# Patient Record
Sex: Male | Born: 1965 | Race: White | Hispanic: No | Marital: Single | Smoking: Never smoker
Health system: Southern US, Community
[De-identification: ages and names within clinical notes are randomized; demographics above are authoritative.]

---

## 2020-05-06 ENCOUNTER — Encounter (HOSPITAL_COMMUNITY): Payer: Self-pay | Admitting: Emergency Medicine

## 2020-05-06 ENCOUNTER — Inpatient Hospital Stay (HOSPITAL_COMMUNITY)
Admission: EM | Admit: 2020-05-06 | Discharge: 2020-05-15 | DRG: 064 | Disposition: A | Discharge status: Rehabilitation Facility | Payer: BLUE CROSS/BLUE SHIELD | Attending: Internal Medicine | Admitting: Internal Medicine

## 2020-05-06 ENCOUNTER — Emergency Department (HOSPITAL_COMMUNITY): Payer: BLUE CROSS/BLUE SHIELD

## 2020-05-06 DIAGNOSIS — I160 Hypertensive urgency: Secondary | ICD-10-CM | POA: Diagnosis present

## 2020-05-06 DIAGNOSIS — I69392 Facial weakness following cerebral infarction: Secondary | ICD-10-CM | POA: Diagnosis not present

## 2020-05-06 DIAGNOSIS — R112 Nausea with vomiting, unspecified: Secondary | ICD-10-CM | POA: Diagnosis not present

## 2020-05-06 DIAGNOSIS — R4701 Aphasia: Secondary | ICD-10-CM | POA: Diagnosis present

## 2020-05-06 DIAGNOSIS — I6340 Cerebral infarction due to embolism of unspecified cerebral artery: Secondary | ICD-10-CM | POA: Diagnosis present

## 2020-05-06 DIAGNOSIS — I714 Abdominal aortic aneurysm, without rupture: Secondary | ICD-10-CM | POA: Diagnosis not present

## 2020-05-06 DIAGNOSIS — E669 Obesity, unspecified: Secondary | ICD-10-CM | POA: Diagnosis present

## 2020-05-06 DIAGNOSIS — R29706 NIHSS score 6: Secondary | ICD-10-CM | POA: Diagnosis present

## 2020-05-06 DIAGNOSIS — Z794 Long term (current) use of insulin: Secondary | ICD-10-CM | POA: Diagnosis not present

## 2020-05-06 DIAGNOSIS — H1089 Other conjunctivitis: Secondary | ICD-10-CM | POA: Diagnosis not present

## 2020-05-06 DIAGNOSIS — I63412 Cerebral infarction due to embolism of left middle cerebral artery: Principal | ICD-10-CM | POA: Diagnosis present

## 2020-05-06 DIAGNOSIS — G936 Cerebral edema: Secondary | ICD-10-CM | POA: Diagnosis present

## 2020-05-06 DIAGNOSIS — I69390 Apraxia following cerebral infarction: Secondary | ICD-10-CM | POA: Diagnosis not present

## 2020-05-06 DIAGNOSIS — G8191 Hemiplegia, unspecified affecting right dominant side: Secondary | ICD-10-CM | POA: Diagnosis present

## 2020-05-06 DIAGNOSIS — R4182 Altered mental status, unspecified: Secondary | ICD-10-CM | POA: Diagnosis not present

## 2020-05-06 DIAGNOSIS — I10 Essential (primary) hypertension: Secondary | ICD-10-CM | POA: Diagnosis present

## 2020-05-06 DIAGNOSIS — E871 Hypo-osmolality and hyponatremia: Secondary | ICD-10-CM | POA: Diagnosis present

## 2020-05-06 DIAGNOSIS — E1165 Type 2 diabetes mellitus with hyperglycemia: Secondary | ICD-10-CM | POA: Diagnosis not present

## 2020-05-06 DIAGNOSIS — E785 Hyperlipidemia, unspecified: Secondary | ICD-10-CM | POA: Diagnosis present

## 2020-05-06 DIAGNOSIS — Z6832 Body mass index (BMI) 32.0-32.9, adult: Secondary | ICD-10-CM | POA: Diagnosis not present

## 2020-05-06 DIAGNOSIS — I6522 Occlusion and stenosis of left carotid artery: Secondary | ICD-10-CM | POA: Diagnosis not present

## 2020-05-06 DIAGNOSIS — Z20822 Contact with and (suspected) exposure to covid-19: Secondary | ICD-10-CM | POA: Diagnosis present

## 2020-05-06 DIAGNOSIS — E119 Type 2 diabetes mellitus without complications: Secondary | ICD-10-CM | POA: Diagnosis present

## 2020-05-06 DIAGNOSIS — I639 Cerebral infarction, unspecified: Secondary | ICD-10-CM | POA: Diagnosis present

## 2020-05-06 DIAGNOSIS — H1031 Unspecified acute conjunctivitis, right eye: Secondary | ICD-10-CM | POA: Diagnosis not present

## 2020-05-06 DIAGNOSIS — I63032 Cerebral infarction due to thrombosis of left carotid artery: Secondary | ICD-10-CM | POA: Diagnosis present

## 2020-05-06 DIAGNOSIS — I712 Thoracic aortic aneurysm, without rupture: Secondary | ICD-10-CM | POA: Diagnosis present

## 2020-05-06 DIAGNOSIS — I719 Aortic aneurysm of unspecified site, without rupture: Secondary | ICD-10-CM | POA: Diagnosis present

## 2020-05-06 DIAGNOSIS — R414 Neurologic neglect syndrome: Secondary | ICD-10-CM | POA: Diagnosis present

## 2020-05-06 DIAGNOSIS — R29810 Facial weakness: Secondary | ICD-10-CM | POA: Diagnosis present

## 2020-05-06 DIAGNOSIS — B370 Candidal stomatitis: Secondary | ICD-10-CM | POA: Diagnosis not present

## 2020-05-06 DIAGNOSIS — R1312 Dysphagia, oropharyngeal phase: Secondary | ICD-10-CM | POA: Diagnosis present

## 2020-05-06 DIAGNOSIS — E876 Hypokalemia: Secondary | ICD-10-CM | POA: Diagnosis not present

## 2020-05-06 DIAGNOSIS — E1159 Type 2 diabetes mellitus with other circulatory complications: Secondary | ICD-10-CM | POA: Diagnosis not present

## 2020-05-06 DIAGNOSIS — D72829 Elevated white blood cell count, unspecified: Secondary | ICD-10-CM | POA: Diagnosis not present

## 2020-05-06 DIAGNOSIS — I63232 Cerebral infarction due to unspecified occlusion or stenosis of left carotid arteries: Secondary | ICD-10-CM | POA: Diagnosis not present

## 2020-05-06 DIAGNOSIS — I6389 Other cerebral infarction: Secondary | ICD-10-CM | POA: Diagnosis not present

## 2020-05-06 DIAGNOSIS — Z7982 Long term (current) use of aspirin: Secondary | ICD-10-CM

## 2020-05-06 DIAGNOSIS — Z8673 Personal history of transient ischemic attack (TIA), and cerebral infarction without residual deficits: Secondary | ICD-10-CM | POA: Diagnosis not present

## 2020-05-06 DIAGNOSIS — Z8249 Family history of ischemic heart disease and other diseases of the circulatory system: Secondary | ICD-10-CM

## 2020-05-06 DIAGNOSIS — I63239 Cerebral infarction due to unspecified occlusion or stenosis of unspecified carotid arteries: Secondary | ICD-10-CM

## 2020-05-06 DIAGNOSIS — E6609 Other obesity due to excess calories: Secondary | ICD-10-CM | POA: Diagnosis not present

## 2020-05-06 DIAGNOSIS — I63512 Cerebral infarction due to unspecified occlusion or stenosis of left middle cerebral artery: Secondary | ICD-10-CM | POA: Diagnosis not present

## 2020-05-06 LAB — I-STAT CHEM 8, ED
BUN: 10 mg/dL (ref 6–20)
Calcium, Ion: 1.09 mmol/L — ABNORMAL LOW (ref 1.15–1.40)
Chloride: 103 mmol/L (ref 98–111)
Creatinine, Ser: 0.6 mg/dL — ABNORMAL LOW (ref 0.61–1.24)
Glucose, Bld: 262 mg/dL — ABNORMAL HIGH (ref 70–99)
HCT: 44 % (ref 39.0–52.0)
Hemoglobin: 15 g/dL (ref 13.0–17.0)
Potassium: 3.8 mmol/L (ref 3.5–5.1)
Sodium: 137 mmol/L (ref 135–145)
TCO2: 21 mmol/L — ABNORMAL LOW (ref 22–32)

## 2020-05-06 LAB — COMPREHENSIVE METABOLIC PANEL
ALT: 45 U/L — ABNORMAL HIGH (ref 0–44)
AST: 28 U/L (ref 15–41)
Albumin: 3.9 g/dL (ref 3.5–5.0)
Alkaline Phosphatase: 84 U/L (ref 38–126)
Anion gap: 11 (ref 5–15)
BUN: 10 mg/dL (ref 6–20)
CO2: 21 mmol/L — ABNORMAL LOW (ref 22–32)
Calcium: 9.2 mg/dL (ref 8.9–10.3)
Chloride: 103 mmol/L (ref 98–111)
Creatinine, Ser: 0.86 mg/dL (ref 0.61–1.24)
GFR, Estimated: >60 mL/min (ref >60)
Glucose, Bld: 261 mg/dL — ABNORMAL HIGH (ref 70–99)
Potassium: 3.9 mmol/L (ref 3.5–5.1)
Sodium: 135 mmol/L (ref 135–145)
Total Bilirubin: 1.1 mg/dL (ref 0.3–1.2)
Total Protein: 6.9 g/dL (ref 6.5–8.1)

## 2020-05-06 LAB — APTT: aPTT: 24 seconds (ref 24–36)

## 2020-05-06 LAB — DIFFERENTIAL
Abs Immature Granulocytes: 0.04 10*3/uL (ref 0.00–0.07)
Basophils Absolute: 0.1 10*3/uL (ref 0.0–0.1)
Basophils Relative: 1 %
Eosinophils Absolute: 0.1 10*3/uL (ref 0.0–0.5)
Eosinophils Relative: 1 %
Immature Granulocytes: 0 %
Lymphocytes Relative: 8 %
Lymphs Abs: 0.7 10*3/uL (ref 0.7–4.0)
Monocytes Absolute: 0.5 10*3/uL (ref 0.1–1.0)
Monocytes Relative: 6 %
Neutro Abs: 7.6 10*3/uL (ref 1.7–7.7)
Neutrophils Relative %: 84 %

## 2020-05-06 LAB — CBC
HCT: 45.8 % (ref 39.0–52.0)
Hemoglobin: 15.7 g/dL (ref 13.0–17.0)
MCH: 30.7 pg (ref 26.0–34.0)
MCHC: 34.3 g/dL (ref 30.0–36.0)
MCV: 89.6 fL (ref 80.0–100.0)
Platelets: 260 10*3/uL (ref 150–400)
RBC: 5.11 MIL/uL (ref 4.22–5.81)
RDW: 12.5 % (ref 11.5–15.5)
WBC: 8.9 10*3/uL (ref 4.0–10.5)
nRBC: 0 % (ref 0.0–0.2)

## 2020-05-06 LAB — RESP PANEL BY RT-PCR (FLU A&B, COVID) ARPGX2
Influenza A by PCR: NEGATIVE
Influenza B by PCR: NEGATIVE
SARS Coronavirus 2 by RT PCR: NEGATIVE

## 2020-05-06 LAB — PROTIME-INR
INR: 1 (ref 0.8–1.2)
Prothrombin Time: 12.7 seconds (ref 11.4–15.2)

## 2020-05-06 LAB — CBG MONITORING, ED: Glucose-Capillary: 258 mg/dL — ABNORMAL HIGH (ref 70–99)

## 2020-05-06 MED ORDER — STROKE: EARLY STAGES OF RECOVERY BOOK: Freq: Once | Status: DC

## 2020-05-06 MED ORDER — SODIUM CHLORIDE 0.9% FLUSH
3.0000 mL | Freq: Once | INTRAVENOUS | Status: DC
Start: 2020-05-06 — End: 2020-05-15

## 2020-05-06 MED ORDER — CLOPIDOGREL BISULFATE 75 MG PO TABS
75.0000 mg | ORAL_TABLET | Freq: Every day | ORAL | Status: DC
Start: 2020-05-07 — End: 2020-05-06

## 2020-05-06 MED ORDER — CLOPIDOGREL BISULFATE 300 MG PO TABS
300.0000 mg | ORAL_TABLET | Freq: Once | ORAL | Status: AC
  Administered 2020-05-06: 300 mg via ORAL
  Filled 2020-05-06: qty 1

## 2020-05-06 MED ORDER — ACETAMINOPHEN 160 MG/5ML PO SOLN
650.0000 mg | ORAL | Status: DC | PRN
  Administered 2020-05-14: 650 mg
  Filled 2020-05-06: qty 20.3

## 2020-05-06 MED ORDER — CLOPIDOGREL BISULFATE 75 MG PO TABS
75.0000 mg | ORAL_TABLET | Freq: Every day | ORAL | Status: DC
  Administered 2020-05-07 – 2020-05-15 (×9): 75 mg via ORAL
  Filled 2020-05-06 (×9): qty 1

## 2020-05-06 MED ORDER — ASPIRIN EC 325 MG PO TBEC
325.0000 mg | DELAYED_RELEASE_TABLET | Freq: Every day | ORAL | Status: DC
  Administered 2020-05-06 – 2020-05-15 (×10): 325 mg via ORAL
  Filled 2020-05-06 (×10): qty 1

## 2020-05-06 MED ORDER — SODIUM CHLORIDE 0.9 % IV SOLN: INTRAVENOUS | Status: DC

## 2020-05-06 MED ORDER — ACETAMINOPHEN 325 MG PO TABS
650.0000 mg | ORAL_TABLET | ORAL | Status: DC | PRN
  Administered 2020-05-13: 650 mg via ORAL
  Filled 2020-05-06: qty 2

## 2020-05-06 MED ORDER — SENNOSIDES-DOCUSATE SODIUM 8.6-50 MG PO TABS: 1.0000 | ORAL_TABLET | Freq: Every evening | ORAL | Status: DC | PRN

## 2020-05-06 MED ORDER — IOHEXOL 350 MG/ML SOLN
115.0000 mL | Freq: Once | INTRAVENOUS | Status: AC | PRN
  Administered 2020-05-06: 115 mL via INTRAVENOUS

## 2020-05-06 MED ORDER — ASPIRIN 300 MG RE SUPP
300.0000 mg | Freq: Every day | RECTAL | Status: DC
  Filled 2020-05-06 (×9): qty 1

## 2020-05-06 MED ORDER — ENOXAPARIN SODIUM 40 MG/0.4ML ~~LOC~~ SOLN
40.0000 mg | SUBCUTANEOUS | Status: DC
  Administered 2020-05-06 – 2020-05-14 (×9): 40 mg via SUBCUTANEOUS
  Filled 2020-05-06 (×9): qty 0.4

## 2020-05-06 MED ORDER — ACETAMINOPHEN 650 MG RE SUPP: 650.0000 mg | RECTAL | Status: DC | PRN

## 2020-05-06 MED ORDER — CLOPIDOGREL BISULFATE 300 MG PO TABS
300.0000 mg | ORAL_TABLET | Freq: Once | ORAL | Status: DC
Start: 2020-05-06 — End: 2020-05-06

## 2020-05-06 NOTE — ED Provider Notes (Signed)
[facility] EMERGENCY DEPARTMENT Provider Note   CSN: [id number] Arrival date & time: 05/06/20  1302  An emergency department physician performed an initial assessment on this suspected stroke patient at 1303.  History Chief Complaint  Patient presents with  . Code Stroke    Aaron Weiss is a 54 y.o. male.  The history is provided by the patient and medical records. No language interpreter was used.  Neurologic Problem This is a new problem. The current episode started 3 to 5 hours ago. The problem occurs constantly. The problem has not changed since onset.Pertinent negatives include no chest pain, no abdominal pain, no headaches and no shortness of breath. Nothing aggravates the symptoms. Nothing relieves the symptoms. He has tried nothing for the symptoms. The treatment provided no relief.       History reviewed. No pertinent past medical history.  There are no problems to display for this patient.   History reviewed. No pertinent surgical history.     No family history on file.  Social History   Tobacco Use  . Smoking status: Not on file  Substance Use Topics  . Alcohol use: Not on file  . Drug use: Not on file    Home Medications Prior to Admission medications   Not on File    Allergies    Patient has no known allergies.  Review of Systems   Review of Systems  Unable to perform ROS: Mental status change  Constitutional: Negative for chills, diaphoresis, fatigue and fever.  HENT: Negative for congestion.   Eyes: Negative for visual disturbance.  Respiratory: Negative for chest tightness and shortness of breath.   Cardiovascular: Negative for chest pain.  Gastrointestinal: Negative for abdominal pain, constipation, diarrhea, nausea and vomiting.  Genitourinary: Negative for flank pain.  Musculoskeletal: Negative for back pain, neck pain and neck stiffness.  Skin: Negative for wound.  Neurological: Negative for dizziness, facial  asymmetry, speech difficulty, weakness, light-headedness, numbness and headaches.  Psychiatric/Behavioral: Positive for confusion. Negative for agitation.  All other systems reviewed and are negative.   Physical Exam Updated Vital Signs BP (!) 180/91   Pulse 86   Temp 98.4 F (36.9 C)   Resp 16   Wt 96.7 kg   SpO2 96%   Physical Exam Vitals and nursing note reviewed.  Constitutional:      General: He is not in acute distress.    Appearance: He is well-developed. He is not ill-appearing, toxic-appearing or diaphoretic.  HENT:     Head: Normocephalic and atraumatic.     Nose: Nose normal. No congestion or rhinorrhea.     Mouth/Throat:     Mouth: Mucous membranes are moist.     Pharynx: No oropharyngeal exudate or posterior oropharyngeal erythema.  Eyes:     Extraocular Movements: Extraocular movements intact.     Conjunctiva/sclera: Conjunctivae normal.     Pupils: Pupils are equal, round, and reactive to light.  Cardiovascular:     Rate and Rhythm: Normal rate and regular rhythm.     Pulses: Normal pulses.     Heart sounds: No murmur heard.   Pulmonary:     Effort: Pulmonary effort is normal. No respiratory distress.     Breath sounds: Normal breath sounds. No wheezing, rhonchi or rales.  Chest:     Chest wall: No tenderness.  Abdominal:     General: Abdomen is flat.     Palpations: Abdomen is soft.     Tenderness: There is no abdominal tenderness. There  is no right CVA tenderness, left CVA tenderness, guarding or rebound.  Musculoskeletal:        General: No tenderness.     Cervical back: Neck supple. No tenderness.     Right lower leg: No edema.     Left lower leg: No edema.  Skin:    General: Skin is warm and dry.     Capillary Refill: Capillary refill takes less than 2 seconds.     Findings: No erythema.  Neurological:     Mental Status: He is alert. He is disoriented.     Cranial Nerves: No dysarthria or facial asymmetry.     Sensory: No sensory deficit.      Motor: No weakness or abnormal muscle tone.     Gait: Abnormal gait: not tested.  Psychiatric:        Mood and Affect: Mood normal.     ED Results / Procedures / Treatments   Labs (all labs ordered are listed, but only abnormal results are displayed) Labs Reviewed  COMPREHENSIVE METABOLIC PANEL - Abnormal; Notable for the following components:      Result Value   CO2 21 (*)    Glucose, Bld 261 (*)    ALT 45 (*)    All other components within normal limits  I-STAT CHEM 8, ED - Abnormal; Notable for the following components:   Creatinine, Ser 0.60 (*)    Glucose, Bld 262 (*)    Calcium, Ion 1.09 (*)    TCO2 21 (*)    All other components within normal limits  CBG MONITORING, ED - Abnormal; Notable for the following components:   Glucose-Capillary 258 (*)    All other components within normal limits  RESP PANEL BY RT-PCR (FLU A&B, COVID) ARPGX2  PROTIME-INR  APTT  CBC  DIFFERENTIAL  RAPID URINE DRUG SCREEN, HOSP PERFORMED  HIV ANTIBODY (ROUTINE TESTING W REFLEX)    EKG EKG Interpretation  Date/Time:  Saturday May 06 2020 13:42:41 EST Ventricular Rate:  78 PR Interval:    QRS Duration: 93 QT Interval:  395 QTC Calculation: 450 R Axis:   89 Text Interpretation: Sinus rhythm no prior ECG for comparison. No STEMI Confirmed by Theda Belfast ([id number]) on 05/06/2020 1:58:02 PM   Radiology CT ANGIO HEAD W OR WO CONTRAST  Result Date: 05/06/2020 CLINICAL DATA:  Stroke/TIA.  Code stroke. EXAM: CT ANGIOGRAPHY HEAD AND NECK CT PERFUSION BRAIN TECHNIQUE: Multidetector CT imaging of the head and neck was performed using the standard protocol during bolus administration of intravenous contrast. Multiplanar CT image reconstructions and MIPs were obtained to evaluate the vascular anatomy. Carotid stenosis measurements (when applicable) are obtained utilizing NASCET criteria, using the distal internal carotid diameter as the denominator. Multiphase CT imaging of the brain was  performed following IV bolus contrast injection. Subsequent parametric perfusion maps were calculated using RAPID software. CONTRAST:  40 mL of Omnipaque 350 IV. COMPARISON:  Same day CT head. FINDINGS: CTA NECK FINDINGS Aortic arch: Great vessel origins are patent. Partially imaged ascending aortic aneurysmal dilation up to 4 cm. Right carotid system: No evidence of dissection, stenosis (50% or greater) or occlusion. Left carotid system: The common carotid artery is patent. 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 The internal carotid artery is non-opacified distally within the neck. Vertebral arteries: Right dominant. No evidence of hemodynamically significant stenosis or occlusion. Skeleton: No acute fracture. Other neck: No mass or suspicious adenopathy. Upper chest: No acute abnormality. Review of the MIP images confirms  the above findings CTA HEAD FINDINGS Anterior circulation: Non opacification of the petrous ICA. Faint opacification of the cavernous ICA. Reconstitution at the carotid terminus with opacified left ACA and left MCA proximally. The more distal MCA branches are poorly opacified. The right ICA is patent. The right MCA and bilateral ACA are patent without hemodynamically significant stenosis. Posterior circulation: Right fetal type PCA. No evidence of hemodynamically significant proximal stenosis or large vessel occlusion. Venous sinuses: As permitted by contrast timing, patent. Review of the MIP images confirms the above findings CT Brain Perfusion Findings: ASPECTS: 5 CBF (<30%) Volume: 56mL Perfusion (Tmax>6.0s) volume: 73 mLmL Mismatch Volume: 49 mLmL Infarction Location:Left anterior (M4) and posterior (M6) MCA territories. IMPRESSION: 1. 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 The left A1 ACA and proximal left MCA are opacified; however, there is poor opacification of more distal left MCA branches. 2. RAPID perfusion  calculates a left MCA territory penumbra of 49 mL with 24 mL of core infarct, as detailed above. 3. Partially imaged ascending aortic aneurysmal dilation up to 4.1 cm. Recommend annual imaging followup by CTA or MRA. This recommendation follows 2010 ACCF/AHA/AATS/ACR/ASA/SCA/SCAI/SIR/STS/SVM Guidelines for the Diagnosis and Management of Patients with Thoracic Aortic Disease. Circulation. 2010; 121: L976-B341. Aortic aneurysm NOS (ICD10-I71.9) Code stroke imaging results were communicated on 05/06/2020 at 1:37 pm to provider Dr. Roda Shutters Via telephone, who verbally acknowledged these results. Electronically Signed   By: Feliberto Harts MD   On: 05/06/2020 13:48   CT ANGIO NECK W OR WO CONTRAST  Result Date: 05/06/2020 CLINICAL DATA:  Stroke/TIA.  Code stroke. EXAM: CT ANGIOGRAPHY HEAD AND NECK CT PERFUSION BRAIN TECHNIQUE: Multidetector CT imaging of the head and neck was performed using the standard protocol during bolus administration of intravenous contrast. Multiplanar CT image reconstructions and MIPs were obtained to evaluate the vascular anatomy. Carotid stenosis measurements (when applicable) are obtained utilizing NASCET criteria, using the distal internal carotid diameter as the denominator. Multiphase CT imaging of the brain was performed following IV bolus contrast injection. Subsequent parametric perfusion maps were calculated using RAPID software. CONTRAST:  40 mL of Omnipaque 350 IV. COMPARISON:  Same day CT head. FINDINGS: CTA NECK FINDINGS Aortic arch: Great vessel origins are patent. Partially imaged ascending aortic aneurysmal dilation up to 4 cm. Right carotid system: No evidence of dissection, stenosis (50% or greater) or occlusion. Left carotid system: The common carotid artery is patent. 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 The internal carotid artery is non-opacified distally within the neck. Vertebral arteries: Right dominant. No evidence of hemodynamically  significant stenosis or occlusion. Skeleton: No acute fracture. Other neck: No mass or suspicious adenopathy. Upper chest: No acute abnormality. Review of the MIP images confirms the above findings CTA HEAD FINDINGS Anterior circulation: Non opacification of the petrous ICA. Faint opacification of the cavernous ICA. Reconstitution at the carotid terminus with opacified left ACA and left MCA proximally. The more distal MCA branches are poorly opacified. The right ICA is patent. The right MCA and bilateral ACA are patent without hemodynamically significant stenosis. Posterior circulation: Right fetal type PCA. No evidence of hemodynamically significant proximal stenosis or large vessel occlusion. Venous sinuses: As permitted by contrast timing, patent. Review of the MIP images confirms the above findings CT Brain Perfusion Findings: ASPECTS: 5 CBF (<30%) Volume: 27mL Perfusion (Tmax>6.0s) volume: 73 mLmL Mismatch Volume: 49 mLmL Infarction Location:Left anterior (M4) and posterior (M6) MCA territories. IMPRESSION: 1. Occlusion of the proximal  left cervical ICA with reconstitution at the carotid terminus. The left A1 ACA and proximal left MCA are opacified; however, there is poor opacification of more distal left MCA branches. 2. RAPID perfusion calculates a left MCA territory penumbra of 49 mL with 24 mL of core infarct, as detailed above. 3. Partially imaged ascending aortic aneurysmal dilation up to 4.1 cm. Recommend annual imaging followup by CTA or MRA. This recommendation follows 2010 ACCF/AHA/AATS/ACR/ASA/SCA/SCAI/SIR/STS/SVM Guidelines for the Diagnosis and Management of Patients with Thoracic Aortic Disease. Circulation. 2010; 121: W098-J191: E266-e369. Aortic aneurysm NOS (ICD10-I71.9) Code stroke imaging results were communicated on 05/06/2020 at 1:37 pm to provider Dr. Roda ShuttersXu Via telephone, who verbally acknowledged these results. Electronically Signed   By: Feliberto HartsFrederick S Jones MD   On: 05/06/2020 13:48   CT CEREBRAL  PERFUSION W CONTRAST  Result Date: 05/06/2020 CLINICAL DATA:  Stroke/TIA.  Code stroke. EXAM: CT ANGIOGRAPHY HEAD AND NECK CT PERFUSION BRAIN TECHNIQUE: Multidetector CT imaging of the head and neck was performed using the standard protocol during bolus administration of intravenous contrast. Multiplanar CT image reconstructions and MIPs were obtained to evaluate the vascular anatomy. Carotid stenosis measurements (when applicable) are obtained utilizing NASCET criteria, using the distal internal carotid diameter as the denominator. Multiphase CT imaging of the brain was performed following IV bolus contrast injection. Subsequent parametric perfusion maps were calculated using RAPID software. CONTRAST:  40 mL of Omnipaque 350 IV. COMPARISON:  Same day CT head. FINDINGS: CTA NECK FINDINGS Aortic arch: Great vessel origins are patent. Partially imaged ascending aortic aneurysmal dilation up to 4 cm. Right carotid system: No evidence of dissection, stenosis (50% or greater) or occlusion. Left carotid system: The common carotid artery is patent. 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 The internal carotid artery is non-opacified distally within the neck. Vertebral arteries: Right dominant. No evidence of hemodynamically significant stenosis or occlusion. Skeleton: No acute fracture. Other neck: No mass or suspicious adenopathy. Upper chest: No acute abnormality. Review of the MIP images confirms the above findings CTA HEAD FINDINGS Anterior circulation: Non opacification of the petrous ICA. Faint opacification of the cavernous ICA. Reconstitution at the carotid terminus with opacified left ACA and left MCA proximally. The more distal MCA branches are poorly opacified. The right ICA is patent. The right MCA and bilateral ACA are patent without hemodynamically significant stenosis. Posterior circulation: Right fetal type PCA. No evidence of hemodynamically significant proximal stenosis or large  vessel occlusion. Venous sinuses: As permitted by contrast timing, patent. Review of the MIP images confirms the above findings CT Brain Perfusion Findings: ASPECTS: 5 CBF (<30%) Volume: 24mL Perfusion (Tmax>6.0s) volume: 73 mLmL Mismatch Volume: 49 mLmL Infarction Location:Left anterior (M4) and posterior (M6) MCA territories. IMPRESSION: 1. 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 The left A1 ACA and proximal left MCA are opacified; however, there is poor opacification of more distal left MCA branches. 2. RAPID perfusion calculates a left MCA territory penumbra of 49 mL with 24 mL of core infarct, as detailed above. 3. Partially imaged ascending aortic aneurysmal dilation up to 4.1 cm. Recommend annual imaging followup by CTA or MRA. This recommendation follows 2010 ACCF/AHA/AATS/ACR/ASA/SCA/SCAI/SIR/STS/SVM Guidelines for the Diagnosis and Management of Patients with Thoracic Aortic Disease. Circulation. 2010; 121: Y782-N562: E266-e369. Aortic aneurysm NOS (ICD10-I71.9) Code stroke imaging results were communicated on 05/06/2020 at 1:37 pm to provider Dr. Roda ShuttersXu Via telephone, who verbally acknowledged these results. Electronically Signed   By: Feliberto HartsFrederick S Jones MD  On: 05/06/2020 13:48   CT HEAD CODE STROKE WO CONTRAST  Result Date: 05/06/2020 CLINICAL DATA:  Code stroke. Acute stroke suspected. Neuro deficit. EXAM: CT HEAD WITHOUT CONTRAST TECHNIQUE: Contiguous axial images were obtained from the base of the skull through the vertex without intravenous contrast. COMPARISON:  None. FINDINGS: Brain: Abnormal hypodensity and loss of gray-white differentiation involving the left MCA territory, including the insula and frontoparietal cortex. The more frontoparietal area of hypodensity is more hypodense and may be more subacute in chronicity. Mild associated sulcal effacement without midline shift. No evidence of acute hemorrhage. Remote left cerebellar infarct. Vascular: No  definite hyperdense vessel. Skull: No acute fracture. Sinuses/Orbits: No acute findings. Other: No mastoid effusions. ASPECTS [facility] Stroke Program Early CT Score) - Ganglionic level infarction (caudate, lentiform nuclei, internal capsule, insula, M1-M3 cortex): 4 - Supraganglionic infarction (M4-M6 cortex): 1 Total score (0-10 with 10 being normal): 5 IMPRESSION: 1. Findings concerning for acute left MCA territory infarct, as detailed above.ASPECTS is 5. 2. This includes an area of hypodensity in the posterior/distal left MCA territory that is more hypodense and may be more subacute in chronicity. 3. No acute hemorrhage. 4. Remote left cerebellar infarct. Code stroke imaging results were communicated on 05/06/2020 at 1:20 pm to provider Dr. Roda Shutters Via telephone. Electronically Signed   By: Feliberto Harts MD   On: 05/06/2020 13:24    Procedures Procedures (including critical care time)  Medications Ordered in ED Medications  sodium chloride flush (NS) 0.9 % injection 3 mL (3 mLs Intravenous Not Given 05/06/20 1346)   stroke: mapping our early stages of recovery book (0 each Does not apply Hold 05/06/20 1449)  aspirin EC tablet 325 mg (has no administration in time range)    Or  aspirin suppository 300 mg (has no administration in time range)  clopidogrel (PLAVIX) tablet 300 mg (has no administration in time range)    Followed by  clopidogrel (PLAVIX) tablet 75 mg (has no administration in time range)  0.9 %  sodium chloride infusion (has no administration in time range)   stroke: mapping our early stages of recovery book (has no administration in time range)  acetaminophen (TYLENOL) tablet 650 mg (has no administration in time range)    Or  acetaminophen (TYLENOL) 160 MG/5ML solution 650 mg (has no administration in time range)    Or  acetaminophen (TYLENOL) suppository 650 mg (has no administration in time range)  senna-docusate (Senokot-S) tablet 1 tablet (has no administration in time  range)  enoxaparin (LOVENOX) injection 40 mg (has no administration in time range)  iohexol (OMNIPAQUE) 350 MG/ML injection 115 mL (115 mLs Intravenous Contrast Given 05/06/20 1329)    ED Course  I have reviewed the triage vital signs and the nursing notes.  Pertinent labs & imaging results that were available during my care of the patient were reviewed by me and considered in my medical decision making (see chart for details).    MDM Rules/Calculators/A&P                          Niraj Kudrna is a 54 y.o. male with a past medical history significant for hypertension who presents as a code stroke.  According to EMS, patient was driving to [place] and it stopped to get gas but could not figure out where the gas pump and was not speaking normally.  Patient is confused and disoriented and was brought in for evaluation.  He was last normal  reportedly at 9 AM when he left to drive to [place] from home.  He is denying any complaints now aside from feeling confused.  He denies any headache, neck pain, neck stiffness, chest pain, palpitations, shortness breath, nausea, vomiting, vision changes, or extremity symptoms.  He does agree he is confused and is disoriented thinking it is in the 94s.  On exam, lungs are clear and chest is nontender.  Abdomen is nontender.  He is moving all extremities.  Clear speech but he is disoriented to place and time.  Difficult to get him to answer all questions and he is distractible.  Patient's airway was clear on arrival.  Patient quickly taken to CT scanner where he subsequently had a CTA.  CTA showed concern for carotid occlusion.  Neurology recommends admission for further management.   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  They suspect this is causing his symptoms.  They request to be admitted to medicine for further management and they will follow.  They report he can have permissive hypertension with a blood pressure needing to  be treated only if it is above 220 systolic over 120 diastolic.  We will call for unassigned admission.    Final Clinical Impression(s) / ED Diagnoses Final diagnoses:  Altered mental status, unspecified altered mental status type  Cerebrovascular accident (CVA), unspecified mechanism (HCC)    Clinical Impression: 1. Altered mental status, unspecified altered mental status type   2. Cerebrovascular accident (CVA), unspecified mechanism (HCC)     Disposition: Admit  This note was prepared with assistance of Dragon voice recognition software. Occasional wrong-word or sound-a-like substitutions may have occurred due to the inherent limitations of voice recognition software.     Welton Bord, Canary Brim, MD 05/06/20 1600

## 2020-05-06 NOTE — ED Notes (Signed)
Internal medicine team at bedside

## 2020-05-06 NOTE — ED Notes (Signed)
Patient transported to MRI 

## 2020-05-06 NOTE — ED Triage Notes (Signed)
Pt arrives via EMS task force after being found at a gas station refusing to pay for his gas, stating "the man in the pump was going to pay." upon ems arrival, pt had significant aphasia, unable to answer any questions appropriately, pt moves all limbs equally, no facial droop noted. Per patient's next of kin that was contact by ems over the phone, pt is from [place] and should not have been in [place] today, also advised that patient was normal upon leaving the house at 9am this morning.

## 2020-05-06 NOTE — ED Notes (Signed)
Dr. Xu at bedside

## 2020-05-06 NOTE — ED Notes (Signed)
Report attempted 

## 2020-05-06 NOTE — H&P (Addendum)
NAME:  Aaron Weiss, MRN:  [id number], DOB:  11-Jan-1966, LOS: 0 ADMISSION DATE:  05/06/2020, Primary: No primary care provider on file.  CHIEF COMPLAINT:  AMS  Medical Service: Internal Medicine Teaching Service         Attending Physician: Dr. Rush Landmark, Canary Brim, *    First Contact: Dr. Ephriam Knuckles Pager: [phone]  Second Contact: Dr. Cyndie Chime Pager: [phone]       After Hours (After 5p/  First Contact Pager: [phone]  weekends / holidays): Second Contact Pager: [phone]    History of present illness   8 yom with hypertension and hyperlipidemia who presented to [facility] via EMS as a code stroke. History is limited to chart review due to patient's altered mental status.   Pt was reportedly driving to [place] today when he stopped to fill gas. Bystanders noted that patient was confused and unable to talk and EMS was called.   Last known normal was at 0900 on day of admission.  Family has noted that pt has been complaining of a headache for the past 2-3d and has been fatigued.   Imaging in the ED revealed an occlusion to the left ICA along with a left MCA territory infarct.   Past Medical History  Hypertension, hyperlipidemia  Home Medications    No known current medications  Allergies    Allergies as of 05/06/2020  . (No Known Allergies)    Social History  No tobacco or illicit drug use reported by pt's brother. Drinks socially.  Family History   Father-deceased-MI  ROS  Unable to be obtained due to acute encephalopathy.  Objective   Blood pressure (!) 180/91, pulse 86, temperature 98.4 F (36.9 C), resp. rate 16, weight 96.7 kg, SpO2 96 %.    Filed Weights   05/06/20 1300  Weight: 96.7 kg    Examination: GENERAL: in no acute distress HEENT: EOM intact. No cervical adenopathy CARDIAC: heart RRR. No LE edema. Extremities warm PULMONARY: breathing comfortably on room air. Lung sounds clear to auscultation. ABDOMEN: soft. Nontender to palpation.   Nondistended.  NEURO: alert. Oriented to person only. Not oriented to place or time. Answers "1967" and "yesterday" to most questions. Follows commands intermittently. Able to repeat 5-7 word sentences. Unable to add a nickle and a dime. PERRL. EOM intact. Face symmetric. No appreciable dysarthria. Strength 5/5 in all extremities. Slightly decreased strength in RUE.  SKIN: no rash or lesions on limited exam  MSK: normal muscle tone    Significant Diagnostic Tests:  EKG: regular rate. Regular rhythm  CT HEAD CODE STROKE WO CONTRAST Result Date: 05/06/2020 IMPRESSION: 1. Findings concerning for acute left MCA territory infarct 2. This includes an area of hypodensity in the posterior/distal left MCA territory that is more hypodense and may be more subacute in chronicity. 3. No acute hemorrhage. 4. Remote left cerebellar infarct. Code stroke imaging results were communicated on 05/06/2020 at 1:20 pm to provider Dr. Roda Shutters Via telephone. Electronically Signed   By: Feliberto Harts MD   On: 05/06/2020 13:24   CT ANGIO HEAD W OR WO CONTRAST Result Date: 05/06/2020 IMPRESSION: 1. 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 The left A1 ACA and proximal left MCA are opacified; however, there is poor opacification of more distal left MCA branches. 2. RAPID perfusion calculates a left MCA territory penumbra of 49 mL with 24 mL of core infarct, as detailed above. 3. Partially imaged ascending aortic aneurysmal dilation up to 4.1 cm.  Recommend annual imaging followup by CTA or MRA.   CT CEREBRAL PERFUSION W CONTRAST Result Date: 05/06/2020 IMPRESSION: 1. 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 The left A1 ACA and proximal left MCA are opacified; however, there is poor opacification of more distal left MCA branches. 2. RAPID perfusion calculates a left MCA territory penumbra of 49 mL with 24 mL of core infarct, as  detailed above. 3. Partially imaged ascending aortic aneurysmal dilation up to 4.1 cm. Recommend annual imaging followup by CTA or MRA.    Labs    CBC Latest Ref Rng & Units 05/06/2020 05/06/2020  WBC 4.0 - 10.5 K/uL - 8.9  Hemoglobin 13.0 - 17.0 g/dL 16.1 09.6  Hematocrit 39 - 52 % 44.0 45.8  Platelets 150 - 400 K/uL - 260   BMP Latest Ref Rng & Units 05/06/2020 05/06/2020  Glucose 70 - 99 mg/dL 045(W) 098(J)  BUN 6 - 20 mg/dL 10 10  Creatinine 1.91 - 1.24 mg/dL 4.78(G) 9.56  Sodium 213 - 145 mmol/L 137 135  Potassium 3.5 - 5.1 mmol/L 3.8 3.9  Chloride 98 - 111 mmol/L 103 103  CO2 22 - 32 mmol/L - 21(L)  Calcium 8.9 - 10.3 mg/dL - 9.2     Summary  54 yom with hypertension, hyperlipidemia, obesity, and prior CVA who was admitted to IMTS for ongoing evaluation and treatment of left MCA territory infarct 2/2 left ICA occlusion  Assessment & Plan:  Principal Problem:   Carotid artery occlusion with cerebral infarction [facility]) Active Problems:   Cerebral embolism with cerebral infarction   Aortic aneurysm (HCC)  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  -Not a tPA candidate due to established stroke and penumbra volume.  -Not a thrombectomy candidate due to minimal salvagable tissue as determined via imaging by neurology.  Partial global aphasia History of cerebellar CVA Plan: Workup per neurology MRI brain Echocardiogram A1C, lipid panel Tele monitoring Allow for permissive hypertension--treatment threshold 220/120 Bedrest, head of bed flat for the day Speech eval  Start aspirin and plavix if he passes swallow screen PT/OT  Hypertension/ hyperlipidemia--allow for permissive hypertension.  Best practice:  CODE STATUS: Full Diet: NPO DVT for prophylaxis: Lovenox Social considerations/Family communication: son updated Dispo: Admit patient to Inpatient with expected length of stay greater than 2 midnights.   Elige Radon, MD Internal Medicine  Resident PGY-2 [facility] Pager: [phone] 05/06/2020 4:21 PM

## 2020-05-06 NOTE — Consult Note (Addendum)
Stroke Neurology Consultation Note  Consult Requested by: Dr. Rush Landmark  Reason for Consult: code stroke  Consult Date: 05/06/20   The history was obtained from the EMS and family (brother and sno).  During history and examination, all items were able to obtain unless otherwise noted.  History of Present Illness:  Ezekiah Massie is a 54 y.o. Caucasian male with PMH of HTN but not seeing doctors for years presented to ED as code stroke.  Per EMS and family, pt was at his baseline today 9am when he started driving to [place]. Per brother, pt son stated that pt complained some HA for the last 2-3 days but no other abnormality. Brother said, pt seems not much energy for the last 2 days but no appeared to be normal otherwise. Per EMS, pt was found in a gas station near [place] area and not knowing how to pump gas, confused and not able to talk, and words made no sense. EMS was called and pt brought to ER for code stroke. No motor deficit per EMS. BP 150s/90, Glucose 258.   Per brother, pt not smoker or drug user, only social drinker. Has no regular doctor. Father died of heart attack. Not eating good diet, brother not surprised of his high glucose. No home meds.   LSN: 9am today tPA Given: No: established stroke > 1/3 MCA  Allergies: No Known Allergies  No current facility-administered medications on file prior to encounter.   No current outpatient medications on file prior to encounter.    Review of Systems: A full ROS was attempted today and was not able to be performed due to aphasia  Physical Examination: Temp:  [98.4 F (36.9 C)] 98.4 F (36.9 C) (11/27 1334) Pulse Rate:  [86] 86 (11/27 1334) Resp:  [16] 16 (11/27 1334) BP: (180)/(91) 180/91 (11/27 1334) SpO2:  [96 %] 96 % (11/27 1334) Weight:  [96.7 kg] 96.7 kg (11/27 1300)  General - well nourished, well developed, in no apparent distress.    Ophthalmologic - fundi not visualized due to noncooperation.    Cardiovascular -  regular rhythm and rate  Neuro - awake alert, eyes open, partial global aphasia, able to tell me his name, say "1967", "I am OK", then perseverated on "1967" for most of the questions. Able to follow most simple commands but not all the commands. Not able to name but able to repeat simple 3-word sentences. No gaze palsy but more left gaze preference with right neglect. Inconsistently blinking to visual threat on the right, with right visual field simultagnosia. Facial symmetrical, tongue midline. No significant dysarthria. Moving all extremities symmetrically bilaterally. No babinski. Sensation, coordination not cooperative and gait not tested.  NIH Stroke Scale  Level Of Consciousness 0=Alert; keenly responsive 1=Arouse to minor stimulation 2=Requires repeated stimulation to arouse or movements to pain 3=postures or unresponsive 0  LOC Questions to Month and Age 78=Answers both questions correctly 1=Answers one question correctly or dysarthria/intubated/trauma/language barrier 2=Answers neither question correctly or aphasia 2  LOC Commands      -Open/Close eyes     -Open/close grip     -Pantomime commands if communication barrier 0=Performs both tasks correctly 1=Performs one task correctly 2=Performs neighter task correctly 1  Best Gaze     -Only assess horizontal gaze 0=Normal 1=Partial gaze palsy 2=Forced deviation, or total gaze paresis 0  Visual 0=No visual loss 1=Partial hemianopia 2=Complete hemianopia 3=Bilateral hemianopia (blind including cortical blindness) 1  Facial Palsy     -Use grimace if obtunded  0=Normal symmetrical movement 1=Minor paralysis (asymmetry) 2=Partial paralysis (lower face) 3=Complete paralysis (upper and lower face) 0  Motor  0=No drift for 10/5 seconds 1=Drift, but does not hit bed 2=Some antigravity effort, hits  bed 3=No effort against gravity, limb falls 4=No movement 0=Amputation/joint fusion Right Arm 0     Leg 0    Left Arm 0     Leg 0   Limb Ataxia     - FNT/HTS 0=Absent or does not understand or paralyzed or amputation/joint fusion 1=Present in one limb 2=Present in two limbs 0  Sensory 0=Normal 1=Mild to moderate sensory loss 2=Severe to total sensory loss or coma/unresponsive 0  Best Language 0=No aphasia, normal 1=Mild to moderate aphasia 2=Severe aphasia 3=Mute, global aphasia, or coma/unresponsive 1  Dysarthria 0=Normal 1=Mild to moderate 2=Severe, unintelligible or mute/anarthric 0=intubated/unable to test 0  Extinction/Neglect 0=No abnormality 1=visual/tactile/auditory/spatia/personal inattention/Extinction to bilateral simultaneous stimulation 2=Profound neglect/extinction more than 1 modality  1  Total   6       Data Reviewed: CT HEAD CODE STROKE WO CONTRAST  Result Date: 05/06/2020 CLINICAL DATA:  Code stroke. Acute stroke suspected. Neuro deficit. EXAM: CT HEAD WITHOUT CONTRAST TECHNIQUE: Contiguous axial images were obtained from the base of the skull through the vertex without intravenous contrast. COMPARISON:  None. FINDINGS: Brain: Abnormal hypodensity and loss of gray-white differentiation involving the left MCA territory, including the insula and frontoparietal cortex. The more frontoparietal area of hypodensity is more hypodense and may be more subacute in chronicity. Mild associated sulcal effacement without midline shift. No evidence of acute hemorrhage. Remote left cerebellar infarct. Vascular: No definite hyperdense vessel. Skull: No acute fracture. Sinuses/Orbits: No acute findings. Other: No mastoid effusions. ASPECTS [facility] Stroke Program Early CT Score) - Ganglionic level infarction (caudate, lentiform nuclei, internal capsule, insula, M1-M3 cortex): 4 - Supraganglionic infarction (M4-M6 cortex): 1 Total score (0-10 with 10 being normal): 5 IMPRESSION: 1. Findings concerning for acute left MCA territory infarct, as detailed above.ASPECTS is 5. 2. This includes an area of hypodensity in the  posterior/distal left MCA territory that is more hypodense and may be more subacute in chronicity. 3. No acute hemorrhage. 4. Remote left cerebellar infarct. Code stroke imaging results were communicated on 05/06/2020 at 1:20 pm to provider Dr. Roda Shutters Via telephone. Electronically Signed   By: Feliberto Harts MD   On: 05/06/2020 13:24    Assessment: 54 y.o. male with PMH of HTN but not seeing doctors for years presented to ED as code stroke with confusion and aphasia.  Last seen well 9 AM today.  NIH score 6 with partial global aphasia and right neglect.  CT, however, showed chronic left cerebellar infarct, subacute/chronic left superior parietal infarct, subacute left parietal infarct, acute left frontal infarct.  Aspect score 4-5.  Patient not TPA candidate given established stroke, and more than 1/3 of MCA territory.  CTA head and neck showed left ICA occlusion beyond bulb with reconstitution at terminal ICA but again occluded at M2 branches.  CT perfusion showed infarct core 24 cc with mismatch of 49 cc.  However given the hypodensity in the plain CT, the infarct core was underestimated due to pseudonormalization.  when compare TTP with plain CT, there is not much tissue salvageable, therefore patient not IR candidate.  Recommend MRI, 2D echo, A1c and lipid panel for further stroke work-up.  Permissive hypertension, no treat if BP less than 220/120.  Keep bedrest and bed flat for today.  IV fluid with normal saline at 100 cc/h.  Bedside swallow screen, if passed swallow screen, aspirin and Plavix load, otherwise aspirin PR.  Plan: - admission to internal medicine for further stroke work-up - HgbA1c, fasting lipid panel, UDS - MRI of the brain without contrast - permissive hypertension, no treatment if BP less than 220/120 - Bedrest and head of bed flat for the day - PT consult, OT consult, Speech consult - Echocardiogram - IV fluid with normal saline at 100 cc/h.   - Bedside swallow screen, if passed  swallow screen, aspirin and Plavix load, otherwise aspirin PR. - Risk factor modification - Telemetry monitoring - Frequent neuro checks - will follow  Thank you for this consultation and allowing [place] to participate in the care of this patient.  Marvel Plan, MD PhD Stroke Neurology 05/06/2020 2:53 PM  This patient is critically ill due to left ICA occlusion, left MCA stroke and at significant risk of neurological worsening from recurrent stroke, hemorrhagic conversion, seizure. This patient's care requires constant monitoring of vital signs, hemodynamics, respiratory and cardiac monitoring, review of multiple databases, neurological assessment, discussion with family, other specialists and medical decision making of high complexity.  I discussed with Dr. Rush Landmark in ED. I also updated patient brother and son several times over the phone.  I also discussed with neuroradiologist Dr. Yetta Barre.  Marvel Plan, MD PhD Stroke Neurology 05/06/2020 2:55 PM

## 2020-05-06 NOTE — ED Notes (Signed)
Dr. Roda Shutters advised to keep patient in supine position with BP remaining <220 systolic.

## 2020-05-06 NOTE — ED Notes (Signed)
Pt returned from MRI °

## 2020-05-07 ENCOUNTER — Inpatient Hospital Stay (HOSPITAL_COMMUNITY): Payer: BLUE CROSS/BLUE SHIELD

## 2020-05-07 ENCOUNTER — Other Ambulatory Visit (HOSPITAL_COMMUNITY): Payer: BLUE CROSS/BLUE SHIELD

## 2020-05-07 DIAGNOSIS — E119 Type 2 diabetes mellitus without complications: Secondary | ICD-10-CM

## 2020-05-07 DIAGNOSIS — I6389 Other cerebral infarction: Secondary | ICD-10-CM

## 2020-05-07 DIAGNOSIS — I63232 Cerebral infarction due to unspecified occlusion or stenosis of left carotid arteries: Secondary | ICD-10-CM | POA: Diagnosis not present

## 2020-05-07 DIAGNOSIS — I63412 Cerebral infarction due to embolism of left middle cerebral artery: Secondary | ICD-10-CM | POA: Diagnosis not present

## 2020-05-07 LAB — RAPID URINE DRUG SCREEN, HOSP PERFORMED
Amphetamines: NOT DETECTED
Barbiturates: NOT DETECTED
Benzodiazepines: NOT DETECTED
Cocaine: NOT DETECTED
Opiates: NOT DETECTED
Tetrahydrocannabinol: NOT DETECTED

## 2020-05-07 LAB — CBC
HCT: 44 % (ref 39.0–52.0)
Hemoglobin: 15.1 g/dL (ref 13.0–17.0)
MCH: 30.6 pg (ref 26.0–34.0)
MCHC: 34.3 g/dL (ref 30.0–36.0)
MCV: 89.1 fL (ref 80.0–100.0)
Platelets: 260 10*3/uL (ref 150–400)
RBC: 4.94 MIL/uL (ref 4.22–5.81)
RDW: 12.6 % (ref 11.5–15.5)
WBC: 11.8 10*3/uL — ABNORMAL HIGH (ref 4.0–10.5)
nRBC: 0 % (ref 0.0–0.2)

## 2020-05-07 LAB — BASIC METABOLIC PANEL
Anion gap: 10 (ref 5–15)
BUN: 8 mg/dL (ref 6–20)
CO2: 26 mmol/L (ref 22–32)
Calcium: 8.9 mg/dL (ref 8.9–10.3)
Chloride: 99 mmol/L (ref 98–111)
Creatinine, Ser: 0.91 mg/dL (ref 0.61–1.24)
GFR, Estimated: >60 mL/min (ref >60)
Glucose, Bld: 209 mg/dL — ABNORMAL HIGH (ref 70–99)
Potassium: 3.9 mmol/L (ref 3.5–5.1)
Sodium: 135 mmol/L (ref 135–145)

## 2020-05-07 LAB — GLUCOSE, CAPILLARY
Glucose-Capillary: 209 mg/dL — ABNORMAL HIGH (ref 70–99)
Glucose-Capillary: 167 mg/dL — ABNORMAL HIGH (ref 70–99)
Glucose-Capillary: 169 mg/dL — ABNORMAL HIGH (ref 70–99)
Glucose-Capillary: 196 mg/dL — ABNORMAL HIGH (ref 70–99)

## 2020-05-07 LAB — LIPID PANEL
Cholesterol: 183 mg/dL (ref 0–200)
HDL: 39 mg/dL — ABNORMAL LOW (ref >40)
LDL Cholesterol: 122 mg/dL — ABNORMAL HIGH (ref 0–99)
Total CHOL/HDL Ratio: 4.7 RATIO
Triglycerides: 111 mg/dL (ref <150)
VLDL: 22 mg/dL (ref 0–40)

## 2020-05-07 LAB — ECHOCARDIOGRAM COMPLETE
Area-P 1/2: 2.8 cm2
S' Lateral: 3.8 cm
Weight: 3410.96 oz

## 2020-05-07 LAB — HEMOGLOBIN A1C
Hgb A1c MFr Bld: 8.7 % — ABNORMAL HIGH (ref 4.8–5.6)
Mean Plasma Glucose: 202.99 mg/dL

## 2020-05-07 LAB — HIV ANTIBODY (ROUTINE TESTING W REFLEX): HIV Screen 4th Generation wRfx: NONREACTIVE

## 2020-05-07 MED ORDER — INSULIN ASPART 100 UNIT/ML ~~LOC~~ SOLN: 0.0000 [IU] | Freq: Every day | SUBCUTANEOUS | Status: DC

## 2020-05-07 MED ORDER — ATORVASTATIN CALCIUM 80 MG PO TABS
80.0000 mg | ORAL_TABLET | Freq: Every day | ORAL | Status: DC
  Administered 2020-05-07 – 2020-05-15 (×9): 80 mg via ORAL
  Filled 2020-05-07 (×9): qty 1

## 2020-05-07 MED ORDER — ONDANSETRON HCL 4 MG/2ML IJ SOLN
4.0000 mg | Freq: Four times a day (QID) | INTRAMUSCULAR | Status: DC | PRN
  Administered 2020-05-07 – 2020-05-08 (×2): 4 mg via INTRAVENOUS
  Filled 2020-05-07: qty 2

## 2020-05-07 MED ORDER — ATORVASTATIN CALCIUM 40 MG PO TABS: 40.0000 mg | ORAL_TABLET | Freq: Every day | ORAL | Status: DC

## 2020-05-07 MED ORDER — ONDANSETRON HCL 4 MG/2ML IJ SOLN
INTRAMUSCULAR | Status: AC
  Filled 2020-05-07: qty 2

## 2020-05-07 MED ORDER — LISINOPRIL 20 MG PO TABS
20.0000 mg | ORAL_TABLET | Freq: Every day | ORAL | Status: DC
  Administered 2020-05-07 – 2020-05-08 (×2): 20 mg via ORAL
  Filled 2020-05-07 (×2): qty 1

## 2020-05-07 MED ORDER — AMLODIPINE BESYLATE 10 MG PO TABS
10.0000 mg | ORAL_TABLET | Freq: Every day | ORAL | Status: DC
  Administered 2020-05-07 – 2020-05-08 (×2): 10 mg via ORAL
  Filled 2020-05-07 (×2): qty 1

## 2020-05-07 MED ORDER — INSULIN ASPART 100 UNIT/ML ~~LOC~~ SOLN
0.0000 [IU] | Freq: Three times a day (TID) | SUBCUTANEOUS | Status: DC
  Administered 2020-05-07 (×2): 3 [IU] via SUBCUTANEOUS
  Administered 2020-05-07 – 2020-05-08 (×3): 5 [IU] via SUBCUTANEOUS
  Administered 2020-05-08 – 2020-05-10 (×5): 3 [IU] via SUBCUTANEOUS
  Administered 2020-05-11: 2 [IU] via SUBCUTANEOUS
  Administered 2020-05-11: 3 [IU] via SUBCUTANEOUS
  Administered 2020-05-11 – 2020-05-12 (×2): 2 [IU] via SUBCUTANEOUS
  Administered 2020-05-12 (×2): 3 [IU] via SUBCUTANEOUS
  Administered 2020-05-13: 2 [IU] via SUBCUTANEOUS
  Administered 2020-05-13 (×2): 3 [IU] via SUBCUTANEOUS
  Administered 2020-05-14: 5 [IU] via SUBCUTANEOUS
  Administered 2020-05-14 – 2020-05-15 (×3): 3 [IU] via SUBCUTANEOUS
  Administered 2020-05-15: 5 [IU] via SUBCUTANEOUS

## 2020-05-07 NOTE — Progress Notes (Signed)
0543 B/P outside of parameters @ 183/85, notified on call physician.  1700 Physician on call returned call and said they are allowing for permissive hypertension with parameters of 220/120 and he will notify the attending when they come in in about an hour .

## 2020-05-07 NOTE — Evaluation (Addendum)
Occupational Therapy Evaluation Patient Details Name: Aaron Weiss MRN: [id number] DOB: 03/30/1966 Today's Date: 05/07/2020    History of Present Illness 54 y.o. Caucasian male with PMH of HTN but not seeing doctors for years presented to ED 05/06/20 as code stroke. NIHSS 6 with global partial aphasia; CT head chronic left cerebellar infarct, subacute/chronic left superior parietal infarct, subacute left parietal infarct, acute left frontal infarct. 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    Clinical Impression   Pt admitted with above diagnoses, presenting with global aphasia, cognitive deficits, and generalized weakness. At time of eval, pt is poor historian due to language deficits. Unsure who he lives with exactly or if below home information is correct. Will attempt to contact family for collateral. Pt had just come off of bed rest at time of eval and MD had ordered OT to do orthostatic vitals. Pt completed supine > sit > stand with min guard assist for safety and impulsivity. Upon standing, pt began gagging but unable to communicate/had little awareness he was going to vomit. PT grabbed basin, and pt proceeded to vomit 3-4x. Dr. Roda Shutters then in room to assess, as well as RN. Pt left in their care. Overall, pt was not able to state accurate answers to open ended questions. When asked an open ended question (even for name and DOB) he would state "1967, 2002". When asked yes/no questions his responses were vastly inconsistent. Also suspect possible R sided visual/perceptual deficits that are difficult to fully assess given pts language/cognition. Given current deficits, recommend CIR at d/c to progress to baseline independence. Will continue to follow per POC listed below.     Follow Up Recommendations  CIR    Equipment Recommendations  3 in 1 bedside commode    Recommendations for Other Services Rehab consult     Precautions / Restrictions Precautions Precautions: Fall;Other  (comment) Precaution Comments: permissive HTN 220/120 Restrictions Weight Bearing Restrictions: No      Mobility Bed Mobility Overal bed mobility: Needs Assistance Bed Mobility: Supine to Sit;Sit to Supine     Supine to sit: Min guard Sit to supine: Min guard   General bed mobility comments: moved impulsively supine to sit (guarding for safety); sit to supine nearly needing assist to raise legs (after vomiting)    Transfers Overall transfer level: Needs assistance Equipment used: None Transfers: Sit to/from Stand Sit to Stand: Min guard         General transfer comment: impulsively coming to stand; wide BOS; no imbalance    Balance Overall balance assessment: Mild deficits observed, not formally tested (wide BOS in standing; could not assess due to incr BP)                                         ADL either performed or assessed with clinical judgement   ADL Overall ADL's : Needs assistance/impaired Eating/Feeding: Set up;Sitting   Grooming: Set up;Sitting Grooming Details (indicate cue type and reason): washed face with wash cloth when it was handed to him                 Toilet Transfer: Minimal assistance;Stand-pivot Toilet Transfer Details (indicate cue type and reason): min A for steadying support           General ADL Comments: full ADL assessment limited due to pt beginning to vomit when standing at start of session. Pt  with little awarenss of this vomiting, not able to communicate appropriately to OT he was feeling ill     Vision Baseline Vision/History: No visual deficits Patient Visual Report: Other (comment) (difficult to assess due to impaired comm) Vision Assessment?: Vision impaired- to be further tested in functional context Additional Comments: Noted mild R gaze preference, unable to accurately assess given pts global aphasia     Perception     Praxis      Pertinent Vitals/Pain Pain Assessment: No/denies  pain Faces Pain Scale: No hurt     Hand Dominance Right   Extremity/Trunk Assessment Upper Extremity Assessment Upper Extremity Assessment: Difficult to assess due to impaired cognition (was able to squeeze OT hands but question if it was at full potential)   Lower Extremity Assessment Lower Extremity Assessment: Defer to PT evaluation   Cervical / Trunk Assessment Cervical / Trunk Assessment: Other exceptions Cervical / Trunk Exceptions: overweight   Communication Communication Communication: Expressive difficulties;Receptive difficulties   Cognition Arousal/Alertness: Awake/alert Behavior During Therapy: WFL for tasks assessed/performed Overall Cognitive Status: Difficult to assess Area of Impairment: Problem solving;Safety/judgement                         Safety/Judgement: Decreased awareness of deficits   Problem Solving: Slow processing;Requires verbal cues (repetition) General Comments: difficult to fully assess 2/2 language deficits. Pt followed ~75% of simple verbal commands. Yes/no questions not always reliable. Showed no awareness of having to vomit despite gagging in standing   General Comments     Exercises     Shoulder Instructions      Home Living Family/patient expects to be discharged to:: Private residence Living Arrangements: Other (Comment) (pt not able to state) Available Help at Discharge: Family Type of Home: Apartment Home Access: Level entry     Home Layout: Two level;Bed/bath upstairs Alternate Level Stairs-Number of Steps: townhome style   Bathroom Shower/Tub: Chief Strategy Officer: Standard     Home Equipment: None   Additional Comments: ?accuracy of information by patient using yes/no ?'s. Expressive and receptive difficulties      Prior Functioning/Environment Level of Independence: Independent        Comments: believe he was working, but unsure        OT Problem List: Decreased strength;Decreased  knowledge of use of DME or AE;Decreased knowledge of precautions;Decreased activity tolerance;Decreased cognition;Impaired balance (sitting and/or standing);Decreased safety awareness      OT Treatment/Interventions: Self-care/ADL training;Therapeutic exercise;Patient/family education;Balance training;Energy conservation;Therapeutic activities;DME and/or AE instruction;Neuromuscular education;Cognitive remediation/compensation    OT Goals(Current goals can be found in the care plan section) Acute Rehab OT Goals Patient Stated Goal: unable to state OT Goal Formulation: Patient unable to participate in goal setting Time For Goal Achievement: 05/21/20 Potential to Achieve Goals: Good  OT Frequency: Min 2X/week   Barriers to D/C:            Co-evaluation PT/OT/SLP Co-Evaluation/Treatment: Yes Reason for Co-Treatment: For patient/therapist safety;Other (comment) (aphasia, vomiting, large CVA) PT goals addressed during session: Mobility/safety with mobility OT goals addressed during session: ADL's and self-care;Strengthening/ROM      AM-PAC OT "6 Clicks" Daily Activity     Outcome Measure Help from another person eating meals?: A Little Help from another person taking care of personal grooming?: A Little Help from another person toileting, which includes using toliet, bedpan, or urinal?: A Lot Help from another person bathing (including washing, rinsing, drying)?: A Lot Help from another person to  put on and taking off regular upper body clothing?: A Little Help from another person to put on and taking off regular lower body clothing?: A Little 6 Click Score: 16   End of Session Nurse Communication: Mobility status  Activity Tolerance: Treatment limited secondary to medical complications (Comment);Other (comment) (vomiting) Patient left: in bed;with call bell/phone within reach;with family/visitor present  OT Visit Diagnosis: Unsteadiness on feet (R26.81);Other abnormalities of gait  and mobility (R26.89);Cognitive communication deficit (R41.841);Other symptoms and signs involving cognitive function Symptoms and signs involving cognitive functions: Cerebral infarction                Time: 5956-3875 OT Time Calculation (min): 28 min Charges:  OT General Charges $OT Visit: 1 Visit OT Evaluation $OT Eval Moderate Complexity: 1 Mod OT Treatments $Self Care/Home Management : 8-22 mins  Dalphine Handing, MSOT, OTR/L Acute Rehabilitation Services [facility] Office Number: [phone] Pager: [phone]  Dalphine Handing 05/07/2020, 1:55 PM

## 2020-05-07 NOTE — Progress Notes (Signed)
Subjective:   Hospital day: 1  Overnight event: No acute event  Patient is seen at bedside.  He appears comfortable.  Patient can follow commands during neuro exam.  He still have aphasia and answers "1966" or "yesterday" when asked about his name or his son's name.  Patient denies any new focal neurological deficits overnight.  Objective:  Vital signs in last 24 hours: Vitals:   05/06/20 1930 05/06/20 2000 05/06/20 2033 05/07/20 0230  BP: (!) 175/97 (!) 153/85 (!) 177/99 (!) 183/85  Pulse: 63 (!) 54 69 61  Resp: 18 12 17 18   Temp:   98.4 F (36.9 C) 98.3 F (36.8 C)  TempSrc:   Oral Oral  SpO2: 94% 93% 98% 95%  Weight:       CBC Latest Ref Rng & Units 05/07/2020 05/06/2020 05/06/2020  WBC 4.0 - 10.5 K/uL 11.8(H) - 8.9  Hemoglobin 13.0 - 17.0 g/dL 05/08/2020 89.3 81.0  Hematocrit 39 - 52 % 44.0 44.0 45.8  Platelets 150 - 400 K/uL 260 - 260   CMP Latest Ref Rng & Units 05/07/2020 05/06/2020 05/06/2020  Glucose 70 - 99 mg/dL 05/08/2020) 102(H) 852(D)  BUN 6 - 20 mg/dL 8 10 10   Creatinine 0.61 - 1.24 mg/dL 782(U ) 2.35  Sodium 135 - 145 mmol/L 135 137 135  Potassium 3.5 - 5.1 mmol/L 3.9 3.8 3.9  Chloride 98 - 111 mmol/L 99 103 103  CO2 22 - 32 mmol/L 26 - 21(L)  Calcium 8.9 - 10.3 mg/dL 8.9 - 9.2  Total Protein 6.5 - 8.1 g/dL - - 6.9  Total Bilirubin 0.3 - 1.2 mg/dL - - 1.1  Alkaline Phos 38 - 126 U/L - - 84  AST 15 - 41 U/L - - 28  ALT 0 - 44 U/L - - 45(H)     Physical Exam  Physical Exam Constitutional:      General: He is not in acute distress. HENT:     Head: Normocephalic.  Eyes:     General:        Right eye: No discharge.        Left eye: No discharge.  Cardiovascular:     Rate and Rhythm: Normal rate and regular rhythm.  Pulmonary:     Effort: No respiratory distress.  Abdominal:     General: Bowel sounds are normal.  Musculoskeletal:     Cervical back: Normal range of motion.     Right lower leg: No edema.     Left lower leg: No edema.  Skin:     General: Skin is warm.  Neurological:     Mental Status: He is alert.     Comments: PERRLA Normal EOM Cranial nerves no deficit Right UE: strength 4-5/5 Left UE: strength 5/5 Right LE: strength 4-5/5. Left UE: Strength 5/5 No pronator drip Patient could not write out his name or his son's name or draw a triangle  Psychiatric:        Mood and Affect: Mood normal.      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  -Not a tPA candidate due to established stroke  and penumbra volume.  -Not a thrombectomy candidate due to minimal salvagable tissue as determined via imaging by neurology.  Partial global aphasia History of cerebellar CVA Plan: Workup per neurology - Echocardiogram pending - Tele monitoring -Continue aspirin 325 mg and Plavix 75 mg - Start atorvastatin 80 mg. Goal LDL < 70 - Allow for permissive hypertension--treatment threshold 220/120 for 48 hours - Bedrest, head of bed flat for 24 hours. - Speech eval  - PT/OT  Diabetes Hgb A1C 8.7 - Start sliding scale insulin - CBG monitor  Hyperlipidemia  LDL 122 - Start Atorvastatin 40 mg. Goal LDL < 70  Hypertension--allow for permissive hypertension.   Ascending aortic aneurysmal dilation up to 4.1 cm - follow up outpatient  Diet: Heart healthy/carb modified IVF: N/A VTE: Lovenox CODE: Full  Prior to Admission Living Arrangement: Home Anticipated Discharge Location: To be determined Barriers to Discharge: Medical management Dispo: Determined by physical therapy  Doran Stabler, DO 05/07/2020,  5:47 AM Pager: [phone] After 5pm on weekdays and 1pm on weekends: On Call pager [phone]

## 2020-05-07 NOTE — Progress Notes (Signed)
MD notified of BP.  No orders received

## 2020-05-07 NOTE — Evaluation (Signed)
Physical Therapy Evaluation Patient Details Name: Aaron Weiss MRN: [id number] DOB: Jan 27, 1966 Today's Date: 05/07/2020   History of Present Illness  54 y.o. Caucasian male with PMH of HTN but not seeing doctors for years presented to ED 05/06/20 as code stroke. NIHSS 6 with global partial aphasia; CT head chronic left cerebellar infarct, subacute/chronic left superior parietal infarct, subacute left parietal infarct, acute left frontal infarct. 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   Clinical Impression   Pt admitted with above diagnosis. Patient was independent PTA (per medical records) and currently demonstrated decreased standing balance. Limited mobility assessment due to vomiting in standing while taking BP and due to increased BP returned to supine. Mobility and education will be complicated due to pt's partial global aphasia.  Pt currently with functional limitations due to the deficits listed below (see PT Problem List). Pt will benefit from skilled PT to increase their independence and safety with mobility to allow discharge to the venue listed below.       Follow Up Recommendations CIR    Equipment Recommendations  None recommended by PT    Recommendations for Other Services Rehab consult     Precautions / Restrictions Precautions Precautions: Fall;Other (comment) Precaution Comments: permissive HTN 220/120      Mobility  Bed Mobility Overal bed mobility: Needs Assistance Bed Mobility: Supine to Sit;Sit to Supine     Supine to sit: Min guard Sit to supine: Min guard   General bed mobility comments: moved impulsively supine to sit (guarding for safety); sit to supine nearly needing assist to raise legs (after vomiting)    Transfers Overall transfer level: Needs assistance Equipment used: None Transfers: Sit to/from Stand Sit to Stand: Min guard         General transfer comment: impulsively coming to stand; wide BOS; no  imbalance  Ambulation/Gait             General Gait Details: unable to assess due to vomiting and elevated BP 193/122  Stairs            Wheelchair Mobility    Modified Rankin (Stroke Patients Only) Modified Rankin (Stroke Patients Only) Pre-Morbid Rankin Score: No symptoms Modified Rankin: Moderately severe disability     Balance Overall balance assessment: Mild deficits observed, not formally tested (wide BOS in standing; could not assess due to incr BP)                                           Pertinent Vitals/Pain Pain Assessment: No/denies pain Faces Pain Scale: No hurt    Home Living Family/patient expects to be discharged to:: Private residence Living Arrangements: Other (Comment);Children;Other relatives (per Dr. Roda Shutters, lives with brother and son) Available Help at Discharge: Family Type of Home: Apartment Home Access: Level entry     Home Layout: Two level;Bed/bath upstairs Home Equipment: None Additional Comments: ?accuracy of information by patient using yes/no ?'s    Prior Function Level of Independence: Independent               Hand Dominance   Dominant Hand: Right    Extremity/Trunk Assessment   Upper Extremity Assessment Upper Extremity Assessment: Defer to OT evaluation    Lower Extremity Assessment Lower Extremity Assessment: Overall WFL for tasks assessed (difficult to assess due to impaired communication)    Cervical / Trunk Assessment Cervical / Trunk Assessment: Other  exceptions Cervical / Trunk Exceptions: overweight  Communication   Communication: Expressive difficulties  Cognition Arousal/Alertness: Awake/alert Behavior During Therapy: WFL for tasks assessed/performed Overall Cognitive Status: Difficult to assess                                 General Comments: pt followed some simple verbal commands but not all; yes/no not reliable; showed no awareness of about to vomit (despite  visible dry heaves)      General Comments General comments (skin integrity, edema, etc.): While obtaining standing BP for orthostatics, pt began to dry-heave. Progressed to vomiting in basin and returned to sitting. Dr Roda Shutters and RN in to assess patient. When noted DBP>120 and vomiting had stopped, assisted pt to supine and left in their care.    Exercises     Assessment/Plan    PT Assessment Patient needs continued PT services  PT Problem List Decreased activity tolerance;Decreased balance;Decreased mobility;Decreased safety awareness;Cardiopulmonary status limiting activity;Obesity       PT Treatment Interventions DME instruction;Gait training;Functional mobility training;Stair training;Therapeutic activities;Therapeutic exercise;Balance training;Neuromuscular re-education;Cognitive remediation;Patient/family education    PT Goals (Current goals can be found in the Care Plan section)  Acute Rehab PT Goals Patient Stated Goal: unable to state PT Goal Formulation: Patient unable to participate in goal setting Time For Goal Achievement: 05/28/20 Potential to Achieve Goals: Good    Frequency Min 4X/week   Barriers to discharge        Co-evaluation PT/OT/SLP Co-Evaluation/Treatment: Yes Reason for Co-Treatment: For patient/therapist safety;Other (comment) (pt coming off bedrest; partial global aphasia; large CVA) PT goals addressed during session: Mobility/safety with mobility         AM-PAC PT "6 Clicks" Mobility  Outcome Measure Help needed turning from your back to your side while in a flat bed without using bedrails?: None Help needed moving from lying on your back to sitting on the side of a flat bed without using bedrails?: A Little Help needed moving to and from a bed to a chair (including a wheelchair)?: A Little Help needed standing up from a chair using your arms (e.g., wheelchair or bedside chair)?: A Little Help needed to walk in hospital room?: Total Help needed  climbing 3-5 steps with a railing? : Total 6 Click Score: 15    End of Session   Activity Tolerance: Treatment limited secondary to medical complications (Comment) (elevated BP and vomiting) Patient left: in bed;with nursing/sitter in room;Other (comment) (Dr Roda Shutters at bedside) Nurse Communication: Other (comment) (vomiting; elevated BP) PT Visit Diagnosis: Unsteadiness on feet (R26.81);Difficulty in walking, not elsewhere classified (R26.2);Other symptoms and signs involving the nervous system (R29.898)    Time: 0272-5366 PT Time Calculation (min) (ACUTE ONLY): 14 min   Charges:   PT Evaluation $PT Eval Low Complexity: 1 Low           Jerolyn Center, PT Pager [phone]   Zena Amos 05/07/2020, 1:43 PM

## 2020-05-07 NOTE — Progress Notes (Signed)
*  PRELIMINARY RESULTS* Echocardiogram 2D Echocardiogram has been performed.  Stacey Drain 05/07/2020, 3:52 PM

## 2020-05-07 NOTE — Progress Notes (Signed)
PT Cancellation Note  Patient Details Name: Aaron Weiss MRN: [id number] DOB: 21-Jul-1965   Cancelled Treatment:    Reason Eval/Treat Not Completed: Active bedrest order   [name] [name] 05/07/2020, 7:07 AM  [name], PT Acute Rehabilitation Services Pager: [phone] Office: [phone]

## 2020-05-07 NOTE — Progress Notes (Signed)
OT Cancellation Note  Patient Details Name: Aaron Weiss MRN: [id number] DOB: March 25, 1966   Cancelled Treatment:    Reason Eval/Treat Not Completed: Active bedrest order.  Eber Jones., OTR/L Acute Rehabilitation Services Pager [phone] Office [phone]   Jeani Hawking M 05/07/2020, 8:05 AM

## 2020-05-07 NOTE — Progress Notes (Signed)
Called to room for patient vomiting, gave zofran per MD order. MD at bedside for exam. Vomiting started when PT had pateint stand up for orthostatic vitals. Dc'ed fluids per MD orders and MD will place orders for BP medications.

## 2020-05-07 NOTE — Progress Notes (Signed)
STROKE TEAM PROGRESS NOTE   INTERVAL HISTORY His RN and PT/OT are at the bedside.  Pt was standing up with PT/OT and developed N/V, he was put back to bed and received zofran. BP was high at 180s to 190s. No neuro changes, still has partial global aphasia, moving all extremities. MRI showed left MCA frontal and parietal infarcts. A1C 8.7 and LDL 122.   OBJECTIVE Vitals:   05/07/20 0230 05/07/20 1252 05/07/20 1300 05/07/20 1314  BP: (!) 183/85 (!) 193/122 (!) 190/104 (!) 187/103  Pulse: 61   70  Resp: 18     Temp: 98.3 F (36.8 C)     TempSrc: Oral     SpO2: 95%     Weight:        CBC:  Recent Labs  Lab 05/06/20 1308 05/06/20 1308 05/06/20 1309 05/07/20 0347  WBC 8.9  --   --  11.8*  NEUTROABS 7.6  --   --   --   HGB 15.7   < > 15.0 15.1  HCT 45.8   < > 44.0 44.0  MCV 89.6  --   --  89.1  PLT 260  --   --  260   < > = values in this interval not displayed.    Basic Metabolic Panel:  Recent Labs  Lab 05/06/20 1308 05/06/20 1308 05/06/20 1309 05/07/20 0347  NA 135   < > 137 135  K 3.9   < > 3.8 3.9  CL 103   < > 103 99  CO2 21*  --   --  26  GLUCOSE 261*   < > 262* 209*  BUN 10   < > 10 8  CREATININE 0.86   < > 0.60* 0.91  CALCIUM 9.2  --   --  8.9   < > = values in this interval not displayed.    Lipid Panel:     Component Value Date/Time   CHOL 183 05/07/2020 0347   TRIG 111 05/07/2020 0347   HDL 39 (L) 05/07/2020 0347   CHOLHDL 4.7 05/07/2020 0347   VLDL 22 05/07/2020 0347   LDLCALC 122 (H) 05/07/2020 0347   HgbA1c:  Lab Results  Component Value Date   HGBA1C 8.7 (H) 05/07/2020   Urine Drug Screen: No results found for: LABOPIA, COCAINSCRNUR, LABBENZ, AMPHETMU, THCU, LABBARB  Alcohol Level No results found for: ETH  IMAGING  CT ANGIO HEAD W OR WO CONTRAST CT ANGIO NECK W OR WO CONTRAST CT CEREBRAL PERFUSION W CONTRAST 05/06/2020 IMPRESSION:  1. 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 The left  A1 ACA and proximal left MCA are opacified; however, there is poor opacification of more distal left MCA branches.  2. RAPID perfusion calculates a left MCA territory penumbra of 49 mL with 24 mL of core infarct, as detailed above.  3. Partially imaged ascending aortic aneurysmal dilation up to 4.1 cm. Recommend annual imaging followup by CTA or MRA.  This recommendation follows 2010 ACCF/AHA/AATS/ACR/ASA/SCA/SCAI/SIR/STS/SVM Guidelines for the Diagnosis and Management of Patients with Thoracic Aortic Disease. Circulation. 2010; 121: U202-R427. Aortic aneurysm NOS (ICD10-I71.9)   MR BRAIN WO CONTRAST 05/06/2020 IMPRESSION:  1. Large acute to early subacute left MCA infarct.  2. Chronic left cerebellar infarct.   CT HEAD CODE STROKE WO CONTRAST 05/06/2020 IMPRESSION:  1. Findings concerning for acute left MCA territory infarct, as detailed above.ASPECTS is 5.  2. This includes an area of hypodensity in the posterior/distal left  MCA territory that is more hypodense and may be more subacute in chronicity.  3. No acute hemorrhage.  4. Remote left cerebellar infarct.   Transthoracic Echocardiogram  00/00/2021 Pending  ECG - SR rate 78 BPM. (See cardiology reading for complete details)  PHYSICAL EXAM Blood pressure (!) 187/103, pulse 70, temperature 98.3 F (36.8 C), temperature source Oral, resp. rate 18, weight 96.7 kg, SpO2 95 %.  General - well nourished, well developed, in no apparent distress.    Ophthalmologic - fundi not visualized due to noncooperation.    Cardiovascular - regular rhythm and rate  Neuro - awake alert, eyes open, partial global aphasia, able to tell me his name, but perseverated on "1967 to 2001" for most of the questions. Able to follow most simple commands but not all the commands. Not able to name but able to repeat simple 5-word sentences. No gaze palsy but more left gaze preference with right neglect. Inconsistently blinking to visual threat on the right,  with right visual field simultagnosia. Facial symmetrical, tongue midline. No significant dysarthria. Moving all extremities symmetrically bilaterally. No babinski. Sensation, coordination not cooperative and gait not tested.  ASSESSMENT/PLAN [name] with history of HTN but not seeing doctors for years brought to ED as code stroke. presenting with confusion, speech difficulties, and glucose 258 with no previous hx of diabetes.  He did not receive IV t-PA due to established stroke > 1/3 MCA.  Stroke: left MCA infarcts due to left ICA and M2 branches occlusion, source unclear, ? Left ICA dissection vs. Uncontrolled risk factors  CT Head - Findings concerning for acute left MCA territory infarct, as detailed above. ASPECTS is 5. This includes an area of hypodensity in the posterior/distal left MCA territory that is more hypodense and may be more subacute in chronicity.   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 The left A1 ACA and proximal left MCA are opacified; however, there is poor opacification of more distal left MCA branches  CTP - penumbra of 49 mL with 24 mL of core infarct  MRI head - Large acute to early subacute left MCA infarcts. Chronic left cerebellar infarct.   2D Echo EF 60 to 65%  TCD bubble study pending  Need to repeat CTA neck in 2-3 months for follow up of left ICA  Consider 30 day cardiac event monitoring to rule out afib  [employer] Virus 2 - negative  LDL - 122  HgbA1c - 8.7  Hypercoagulable work up - pending  UDS - pending  VTE prophylaxis -Lovenox  aspirin 81 mg daily prior to admission, now on aspirin 325 mg daily and clopidogrel 75 mg daily DAPT for 3 months and then ASA alone given large vessel occlusion.   Patient counseled to be compliant with his antithrombotic medications  Ongoing aggressive stroke risk factor management  Therapy recommendations:   CIR  Disposition:  Pending  Hypertensive urgency  Home BP meds: none   Current BP meds: amlodipine 10 and lisinopril 20 . Gradually decrease BP at goal in 3-5 days . Long-term BP goal 130-150 given left ICA occlusion  Hyperlipidemia  Home Lipid lowering medication: none   LDL 122, goal < 70  Current lipid lowering medication: Lipitor 80 mg daily   Continue statin at discharge  Diabetes, new diagnosis  Home diabetic meds: none   Current diabetic meds: SSI   HgbA1c 8.7, goal < 7.0  CBG monitoring  Close PCP follow up  DM education  Other Stroke Risk Factors  ETOH - social drinker  Obesity   Previous strokes by imaging - left cerebellar  Other Active Problems  Code status - Full code   Partially imaged ascending aortic aneurysmal dilation up to 4.1 cm. Recommend annual imaging followup by CTA or MRA.  Leukocytosis - WBC's - 8.9->11.8  (afebrile)  Vomiting - one episode with standing up - zofran PRN  Hospital day # 1  Marvel Plan, MD PhD Stroke Neurology 05/07/2020 6:31 PM    To contact Stroke Continuity provider, please refer to [email or web address]. After hours, contact General Neurology

## 2020-05-07 NOTE — TOC Progression Note (Signed)
Transition of Care [facility]) - Progression Note    Patient Details  Name: Aaron Weiss MRN: [id number] Date of Birth: 12-02-1965  Transition [facility]) CM/SW Contact  Carley Hammed, [place] Phone Number: 05/07/2020, 5:08 PM  Clinical Narrative:     CSW received message that pt's son would like for him to return to [place] for his hospital stay and possibly rehab. SW should follow up with son to discuss barriers and continued discharge planning.       Expected Discharge Plan and Services                                                 Social Determinants of Health (SDOH) Interventions    Readmission Risk Interventions No flowsheet data found.

## 2020-05-08 ENCOUNTER — Encounter (HOSPITAL_COMMUNITY): Payer: Self-pay | Admitting: Internal Medicine

## 2020-05-08 ENCOUNTER — Inpatient Hospital Stay (HOSPITAL_COMMUNITY): Payer: BLUE CROSS/BLUE SHIELD

## 2020-05-08 DIAGNOSIS — I63412 Cerebral infarction due to embolism of left middle cerebral artery: Secondary | ICD-10-CM | POA: Diagnosis not present

## 2020-05-08 DIAGNOSIS — E1165 Type 2 diabetes mellitus with hyperglycemia: Secondary | ICD-10-CM

## 2020-05-08 DIAGNOSIS — I63232 Cerebral infarction due to unspecified occlusion or stenosis of left carotid arteries: Secondary | ICD-10-CM

## 2020-05-08 LAB — BASIC METABOLIC PANEL
Anion gap: 12 (ref 5–15)
BUN: 10 mg/dL (ref 6–20)
CO2: 23 mmol/L (ref 22–32)
Calcium: 9.1 mg/dL (ref 8.9–10.3)
Chloride: 97 mmol/L — ABNORMAL LOW (ref 98–111)
Creatinine, Ser: 0.9 mg/dL (ref 0.61–1.24)
GFR, Estimated: >60 mL/min (ref >60)
Glucose, Bld: 198 mg/dL — ABNORMAL HIGH (ref 70–99)
Potassium: 3.6 mmol/L (ref 3.5–5.1)
Sodium: 132 mmol/L — ABNORMAL LOW (ref 135–145)

## 2020-05-08 LAB — GLUCOSE, CAPILLARY
Glucose-Capillary: 185 mg/dL — ABNORMAL HIGH (ref 70–99)
Glucose-Capillary: 210 mg/dL — ABNORMAL HIGH (ref 70–99)
Glucose-Capillary: 203 mg/dL — ABNORMAL HIGH (ref 70–99)
Glucose-Capillary: 135 mg/dL — ABNORMAL HIGH (ref 70–99)

## 2020-05-08 LAB — CBC
HCT: 45.3 % (ref 39.0–52.0)
Hemoglobin: 15.8 g/dL (ref 13.0–17.0)
MCH: 30.6 pg (ref 26.0–34.0)
MCHC: 34.9 g/dL (ref 30.0–36.0)
MCV: 87.6 fL (ref 80.0–100.0)
Platelets: 270 10*3/uL (ref 150–400)
RBC: 5.17 MIL/uL (ref 4.22–5.81)
RDW: 12.4 % (ref 11.5–15.5)
WBC: 12 10*3/uL — ABNORMAL HIGH (ref 4.0–10.5)
nRBC: 0 % (ref 0.0–0.2)

## 2020-05-08 LAB — TSH: TSH: 1.618 u[IU]/mL (ref 0.350–4.500)

## 2020-05-08 LAB — VITAMIN B12: Vitamin B-12: 211 pg/mL (ref 180–914)

## 2020-05-08 LAB — RPR: RPR Ser Ql: NONREACTIVE

## 2020-05-08 MED ORDER — INSULIN GLARGINE 100 UNIT/ML ~~LOC~~ SOLN
10.0000 [IU] | Freq: Every day | SUBCUTANEOUS | Status: DC
  Administered 2020-05-08 – 2020-05-14 (×6): 10 [IU] via SUBCUTANEOUS
  Filled 2020-05-08 (×10): qty 0.1

## 2020-05-08 NOTE — Evaluation (Addendum)
Speech Language Pathology Evaluation Patient Details Name: Aaron Weiss MRN: [id number] DOB: 1966-04-16 Today's Date: 05/08/2020 Time: 2831-5176 SLP Time Calculation (min) (ACUTE ONLY): 26 min  Problem List:  Patient Active Problem List   Diagnosis Date Noted  . Diabetes mellitus, type 2 (HCC) 05/07/2020  . Cerebral embolism with cerebral infarction 05/06/2020  . Carotid artery occlusion with cerebral infarction (HCC) 05/06/2020  . Aortic aneurysm (HCC) 05/06/2020  . CVA (cerebral vascular accident) (HCC) 05/06/2020   Past Medical History: History reviewed. No pertinent past medical history. Past Surgical History: History reviewed. No pertinent surgical history. HPI:  54 y.o. Caucasian male with PMH of HTN but not seeing doctors for years presented to ED 05/06/20 as code stroke. NIHSS 6 with global partial aphasia; CT head chronic left cerebellar infarct, subacute/chronic left superior parietal infarct, subacute left parietal infarct, acute left frontal infarct. 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    Assessment / Plan / Recommendation Clinical Impression  Pt's aphasia is global with characteristics of Wernicke's. He is unaware of verbal errors and had difficulty comprehending language although output during assessment limited to one word. He is able to repeat most shorter sentences with articulatory imprecision. Counted 1-3 independently and 4-10 with auditory and written cues. Verbally his perseverations rotated between 3 words. No command following with auditory information and written language was not beneficial. He did match single word to object field =2 with 30% accuracy. When asked to write his name he wrote resemblence of his address. Pt's brother, Thurston Hole. present and education re: ways to assist pt with language goals. He would benefit from continued intense rehab at outpatient or if needs to start with home health and transition. Pt lives in [place]. ST to see  while in hospital.        SLP Assessment  SLP Recommendation/Assessment: Patient needs continued Speech Lanaguage Pathology Services SLP Visit Diagnosis: Aphasia (R47.01)    Follow Up Recommendations  Other (comment);Outpatient SLP;24 hour supervision/assistance    Frequency and Duration min 3x week  2 weeks      SLP Evaluation Cognition  Overall Cognitive Status: Impaired/Different from baseline Arousal/Alertness: Awake/alert Orientation Level:  (per y/n disoriented to place, situation, time) Attention: Sustained Sustained Attention: Appears intact Memory:  (to be assessed) Awareness: Impaired Awareness Impairment: Emergent impairment Problem Solving:  (BD) Safety/Judgment: Impaired       Comprehension  Auditory Comprehension Overall Auditory Comprehension: Impaired Basic Biographical Questions: 26-50% accurate (50%) Basic Immediate Environment Questions: 50-74% accurate (50%) Commands: Impaired One Step Basic Commands: 0-24% accurate Visual Recognition/Discrimination Discrimination: Not tested Reading Comprehension Reading Status: Impaired Word level: Impaired    Expression Expression Primary Mode of Expression: Verbal Verbal Expression Overall Verbal Expression: Impaired Initiation: No impairment Automatic Speech:  (counted to 10 with phonemic and visual cues) Level of Generative/Spontaneous Verbalization: Word Repetition: No impairment (mostly intact) Naming: Impairment Responsive: 0-25% accurate Confrontation: Impaired Convergent: 0-24% accurate Verbal Errors: Not aware of errors Pragmatics: No impairment Written Expression Dominant Hand: Right Written Expression: Exceptions to [facility] Self Formulation Ability: Word;Phrase   Oral / Motor  Oral Motor/Sensory Function Overall Oral Motor/Sensory Function: Within functional limits Motor Speech Overall Motor Speech: Impaired Respiration: Within functional limits Phonation: Normal Resonance: Within  functional limits Articulation: Impaired Level of Impairment: Word Intelligibility: Intelligible Motor Planning: Witnin functional limits   GO                    Royce Macadamia 05/08/2020, 1:48 PM Breck Coons [employer]  M.Ed Sports administrator Pager [phone] Office [phone]

## 2020-05-08 NOTE — Progress Notes (Signed)
STROKE TEAM PROGRESS NOTE   INTERVAL HISTORY No family is at bedside. Pt still has expressive aphasia more than receptive aphasia. Moving all extremities. No window for TCD bubble study, will recommend TEE to rule out cardiac source of stroke.    OBJECTIVE Vitals:   05/07/20 1314 05/07/20 1800 05/07/20 2029 05/08/20 0520  BP: (!) 187/103 (!) 174/100 (!) 169/92 (!) 142/88  Pulse: 70 74 75 65  Resp:  18 17 18   Temp:   98.4 F (36.9 C) 98.2 F (36.8 C)  TempSrc:   Oral Oral  SpO2:   96% 98%  Weight:        CBC:  Recent Labs  Lab 05/06/20 1308 05/06/20 1309 05/07/20 0347 05/08/20 0111  WBC 8.9   < > 11.8* 12.0*  NEUTROABS 7.6  --   --   --   HGB 15.7   < > 15.1 15.8  HCT 45.8   < > 44.0 45.3  MCV 89.6   < > 89.1 87.6  PLT 260   < > 260 270   < > = values in this interval not displayed.    Basic Metabolic Panel:  Recent Labs  Lab 05/07/20 0347 05/08/20 0111  NA 135 132*  K 3.9 3.6  CL 99 97*  CO2 26 23  GLUCOSE 209* 198*  BUN 8 10  CREATININE 0.91 0.90  CALCIUM 8.9 9.1    Lipid Panel:     Component Value Date/Time   CHOL 183 05/07/2020 0347   TRIG 111 05/07/2020 0347   HDL 39 (L) 05/07/2020 0347   CHOLHDL 4.7 05/07/2020 0347   VLDL 22 05/07/2020 0347   LDLCALC 122 (H) 05/07/2020 0347   HgbA1c:  Lab Results  Component Value Date   HGBA1C 8.7 (H) 05/07/2020   Urine Drug Screen:     Component Value Date/Time   LABOPIA NONE DETECTED 05/07/2020 1850   COCAINSCRNUR NONE DETECTED 05/07/2020 1850   LABBENZ NONE DETECTED 05/07/2020 1850   AMPHETMU NONE DETECTED 05/07/2020 1850   THCU NONE DETECTED 05/07/2020 1850   LABBARB NONE DETECTED 05/07/2020 1850    Alcohol Level No results found for: ETH  IMAGING  CT ANGIO HEAD W OR WO CONTRAST CT ANGIO NECK W OR WO CONTRAST CT CEREBRAL PERFUSION W CONTRAST 05/06/2020 IMPRESSION:  1. 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 The left A1 ACA and proximal left  MCA are opacified; however, there is poor opacification of more distal left MCA branches.  2. RAPID perfusion calculates a left MCA territory penumbra of 49 mL with 24 mL of core infarct, as detailed above.  3. Partially imaged ascending aortic aneurysmal dilation up to 4.1 cm. Recommend annual imaging followup by CTA or MRA.  This recommendation follows 2010 ACCF/AHA/AATS/ACR/ASA/SCA/SCAI/SIR/STS/SVM Guidelines for the Diagnosis and Management of Patients with Thoracic Aortic Disease. Circulation. 2010; 1212011. Aortic aneurysm NOS (ICD10-I71.9)   MR BRAIN WO CONTRAST 05/06/2020 IMPRESSION:  1. Large acute to early subacute left MCA infarct.  2. Chronic left cerebellar infarct.   CT HEAD CODE STROKE WO CONTRAST 05/06/2020 IMPRESSION:  1. Findings concerning for acute left MCA territory infarct, as detailed above.ASPECTS is 5.  2. This includes an area of hypodensity in the posterior/distal left MCA territory that is more hypodense and may be more subacute in chronicity.  3. No acute hemorrhage.  4. Remote left cerebellar infarct.   Transthoracic Echocardiogram  1. Left ventricular ejection fraction, by estimation, is 60 to 65%.  The  left ventricle has normal function. The left ventricle has no regional  wall motion abnormalities. There is mild left ventricular hypertrophy.  Left ventricular diastolic parameters  are consistent with Grade I diastolic dysfunction (impaired relaxation).  2. Right ventricular systolic function is normal. The right ventricular  size is normal.  3. The mitral valve is grossly normal. No evidence of mitral valve  regurgitation.  4. The aortic valve is tricuspid. Aortic valve regurgitation is not  visualized.  5. The inferior vena cava is normal in size with greater than 50%  respiratory variability, suggesting right atrial pressure of 3 mmHg.   ECG - SR rate 78 BPM. (See cardiology reading for complete details)  PHYSICAL EXAM  Blood  pressure (!) 142/88, pulse 65, temperature 98.2 F (36.8 C), temperature source Oral, resp. rate 18, weight 96.7 kg, SpO2 98 %.  General - well nourished, well developed, in no apparent distress.    Ophthalmologic - fundi not visualized due to noncooperation.    Cardiovascular - regular rhythm and rate  Neuro - awake alert, eyes open, partial global aphasia, able to say "thank you" "yes/no" "OK", but perseverated on "1966 to 2001" for most of the questions. Able to follow most simple commands but not all the commands. Not able to name but able to repeat simple 3-word sentences. No gaze palsy but more left gaze preference with right neglect. Inconsistently blinking to visual threat on the right, with right visual field simultagnosia. Facial symmetrical, tongue midline. No significant dysarthria. Moving all extremities symmetrically bilaterally. No babinski. Sensation, coordination not cooperative and gait not tested.  ASSESSMENT/PLAN [name] with history of HTN but not seeing doctors for years brought to ED as code stroke. presenting with confusion, speech difficulties, and glucose 258 with no previous hx of diabetes.  He did not receive IV t-PA due to established stroke > 1/3 MCA.  Stroke: left MCA infarcts due to left ICA and M2 branches occlusion, source unclear, ? Left ICA dissection vs. Uncontrolled risk factors  CT Head - Findings concerning for acute left MCA territory infarct, as detailed above. ASPECTS is 5. This includes an area of hypodensity in the posterior/distal left MCA territory that is more hypodense and may be more subacute in chronicity.   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 The left A1 ACA and proximal left MCA are opacified; however, there is poor opacification of more distal left MCA branches  CTP - penumbra of 49 mL with 24 mL of core infarct  MRI head - Large acute to early  subacute left MCA infarcts. Chronic left cerebellar infarct.   2D Echo EF 60 to 65%  TCD bubble study no window  Recommend TEE to rule out cardiac source of stroke  Need to repeat CTA neck in 2-3 months for follow up of left ICA status  Consider 30 day cardiac event monitoring to rule out afib  [employer] Virus 2 - negative  LDL - 122  HgbA1c - 8.7  Hypercoagulable work up - pending  UDS - neg  VTE prophylaxis -Lovenox  aspirin 81 mg daily prior to admission, now on aspirin 325 mg daily and clopidogrel 75 mg daily DAPT for 3 months and then ASA alone given large vessel occlusion.   Patient counseled to be compliant with his antithrombotic medications  Ongoing aggressive stroke risk factor management  Therapy recommendations:  CIR  Disposition:  Pending  Hypertensive urgency  Home BP meds: none   Current BP meds: amlodipine 10 and lisinopril 20 . Gradually decrease BP at goal in 3-5 days . Long-term BP goal 130-150 given left ICA occlusion  Hyperlipidemia  Home Lipid lowering medication: none   LDL 122, goal < 70  Current lipid lowering medication: Lipitor 80 mg daily   Continue statin at discharge  Diabetes, new diagnosis  Home diabetic meds: none   Current diabetic meds: SSI   HgbA1c 8.7, goal < 7.0  CBG monitoring  Close PCP follow up  DM education for new DM  Other Stroke Risk Factors  ETOH - social drinker  Obesity   Previous strokes by imaging - left cerebellar  Other Active Problems  Code status - Full code   Partially imaged ascending aortic aneurysmal dilation up to 4.1 cm. Recommend annual imaging followup by CTA or MRA.  Leukocytosis - WBC's - 8.9->11.8->12.0  (afebrile)  Vomiting 11/28 - one episode with standing up - zofran PRN  Hospital day # 2  Marvel Plan, MD PhD Stroke Neurology 05/08/2020 8:40 AM    To contact Stroke Continuity provider, please refer to [email or web address]. After hours, contact General Neurology

## 2020-05-08 NOTE — Progress Notes (Signed)
Inpatient Rehabilitation-Admissions Coordinator   Met with pt and his brother bedside as follow up from PM&R consult completed by Dr. Ranell Patrick. Please see consult note for details. Pt and his brother are very interested in an IP Rehab program and would like to pursue this route. When I spoke with the patient's son to confirm DC plans, the son is requesting an IP Rehab closer to [place], [place] where he lives. AC has notified TOC team and will follow closely as back up in case there is not another facility that can accept him. Will need to begin insurance auth process IF [facility] IP Rehab is chosen.   Raechel Ache, OTR/L  Rehab Admissions Coordinator  [phone] 05/08/2020 1:51 PM

## 2020-05-08 NOTE — PMR Pre-admission (Signed)
PMR Admission Coordinator Pre-Admission Assessment  Patient: Aaron Weiss is an 54 y.o., male MRN: [id number] DOB: 1965-12-11 Height: _0  (167.6 cm) Weight: 90 kg              Insurance Information HMO:     PPO: yes     PCP:      IPA:      80/20:      OTHER:  PRIMARY: [employer] [place]      Policy#: [id number]      Subscriber: patient CM Name: Bubba Hales       Phone#: [phone]     Fax#: [phone] Pre-Cert#: [id number]      Employer:  Aaron Weiss provided by Bubba Hales for admit to CIR. Pt is approved from 12/6 to 12/13 with updates due 12/13. Continued stay review to go to (f): [phone] Benefits:  Phone #: [phone]     Name:  Eff. Date: 06/10/2016- still active     Deduct: $1,500 ($0 met)      Out of Pocket Max: $6,500 ($0 met)      Life Max: NA  CIR: $300 per day co-pay for days 1-5      SNF: after deductible, benefits are not for extended care Outpatient: after deductible, 80% coverage, 20% co-insurance; limit 30 visits     Home Health: after deductible, 100% coverage, 0% co-insurance; pre-auth requried     DME: after deductible, 80% coverage, 20% co-insurance     Providers:  SECONDARY: None      Policy#:       Phone#:   Development worker, community:       Phone#:   [employer] Engineer, [employer]" for patients in Inpatient Rehabilitation Facilities with attached "Privacy Act [place] Records" was provided and verbally reviewed with: N/A  Emergency Contact Information Contact Information    Name Relation Home Work [place] Son   [phone]   [place] Brother   [phone]   Ray, Gervasi Sister   [phone]     Current Medical History  Patient Admitting Diagnosis:  Carotid artery occlusion with cerebral infarction  History of Present Illness: Aaron Weiss is a 54 y.o. right-handed male with history of hypertension but not seeing a doctor for years.  Per chart review lives with brother and son.  Reportedly independent prior to  admission.  Two-level home bed and bath upstairs.  Presented 05/06/2020 with headache and altered mental status/aphasia.  Blood pressure 150s/90, glucose 258.  Cranial CT scan findings concerning for acute left MCA territory infarction.  No acute hemorrhage.  Remote left cerebellar infarct.  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  Patient did not receive TPA.  CT cerebral perfusion scan partially imaged ascending aortic aneurysmal dilation up to 4.1 cm.  Recommendations for annual imaging followed by CTA or MRA.  MRI follow-up showed large acute early subacute left MCA infarction.  Echocardiogram with ejection fraction of 60 to 65% no wall motion abnormalities grade 1 diastolic dysfunction.  Admission chemistries glucose 261, hemoglobin A1c 8.7, urine drug screen negative.  Currently maintained on aspirin 325 mg and Plavix for CVA prophylaxis x3 months then aspirin alone.  Subcutaneous Lovenox for DVT prophylaxis.  Tolerating a regular consistency diet.  Therapy evaluations completed with recommendations of physical medicine rehab. On 11/30 pt was noted to be flaccid on the right side and code stroke was called. NIHSS 22 with pt disoriented, not following commands, left gaze preference,  right hemianopia, facial droop, right arm and leg weakness, right sided decreased sensation, global aphasia, dysarthria, and sensory neglect noted. CT and CTA of head and neck was preformed and revealed a left M1 proximal occlusion. TEE was recommended. Pt re-evaluated by therapies with pt showing increased deficits. IP Rehab continued to be recommended. Pt is to admit to CIR on 05/15/20.   Complete NIHSS TOTAL: 19    Past Medical History  History reviewed. No pertinent past medical history.  Family History  family history includes Diabetes in his sister; High Cholesterol in his brother; Stroke in his father.  Prior Rehab/Hospitalizations:  Has  the patient had prior rehab or hospitalizations prior to admission? No  Has the patient had major surgery during 100 days prior to admission? No  Current Medications   Current Facility-Administered Medications:  .   stroke: mapping our early stages of recovery book, , Does not apply, Once, Gaylan Gerold, DO .  acetaminophen (TYLENOL) tablet 650 mg, 650 mg, Oral, Q4H PRN, 650 mg at 05/13/20 1619 **OR** acetaminophen (TYLENOL) 160 MG/5ML solution 650 mg, 650 mg, Per Tube, Q4H PRN, 650 mg at 05/14/20 0935 **OR** acetaminophen (TYLENOL) suppository 650 mg, 650 mg, Rectal, Q4H PRN, Gaylan Gerold, DO .  aspirin EC tablet 325 mg, 325 mg, Oral, Daily, 325 mg at 05/15/20 0856 **OR** aspirin suppository 300 mg, 300 mg, Rectal, Daily, Gaylan Gerold, DO .  atorvastatin (LIPITOR) tablet 80 mg, 80 mg, Oral, Daily, Gaylan Gerold, DO, 80 mg at 05/15/20 0856 .  [COMPLETED] clopidogrel (PLAVIX) tablet 300 mg, 300 mg, Oral, Once, 300 mg at 05/06/20 1647 **FOLLOWED BY** clopidogrel (PLAVIX) tablet 75 mg, 75 mg, Oral, Daily, Gaylan Gerold, DO, 75 mg at 05/15/20 0856 .  enoxaparin (LOVENOX) injection 40 mg, 40 mg, Subcutaneous, Q24H, Gaylan Gerold, DO, 40 mg at 05/14/20 2133 .  erythromycin ophthalmic ointment, , Right Eye, Q6H, Iona Beard, MD .  fluconazole (DIFLUCAN) tablet 200 mg, 200 mg, Oral, Daily, Iona Beard, MD, 200 mg at 05/15/20 0856 .  insulin aspart (novoLOG) injection 0-15 Units, 0-15 Units, Subcutaneous, TID WC, Gaylan Gerold, DO, 3 Units at 05/15/20 0856 .  insulin aspart (novoLOG) injection 0-5 Units, 0-5 Units, Subcutaneous, QHS, Gaylan Gerold, DO .  insulin glargine (LANTUS) injection 10 Units, 10 Units, Subcutaneous, QHS, Gaylan Gerold, DO, 10 Units at 05/14/20 2133 .  lisinopril (ZESTRIL) tablet 20 mg, 20 mg, Oral, Daily, Iona Beard, MD, 20 mg at 05/15/20 0901 .  ondansetron (ZOFRAN) injection 4 mg, 4 mg, Intravenous, Q6H PRN, Rosalin Hawking, MD, 4 mg at 05/08/20 1312 .  [employer], ,  Oral, PRN, Rosalin Hawking, MD .  senna-docusate (Senokot-S) tablet 1 tablet, 1 tablet, Oral, QHS PRN, Gaylan Gerold, DO .  sodium chloride flush (NS) 0.9 % injection 3 mL, 3 mL, Intravenous, Once, Gaylan Gerold, DO  Patients Current Diet:  Diet Order            DIET - DYS 1 Room service appropriate? Yes; Fluid consistency: Pudding Thick  Diet effective now                 Precautions / Restrictions Precautions Precautions: Fall, Other (comment) Precaution Comments: R visual fields deficits, R hemi, aphasia Restrictions Weight Bearing Restrictions: No   Has the patient had 2 or more falls or a fall with injury in the past year?No  Prior Activity Level Community (5-7x/wk): worked full time as Armed forces technical officer. drove and Independent PTA; lived alone  Prior Functional Level Prior Function Level  of Independence: Independent Comments: believe he was working, but unsure  Self Care: Did the patient need help bathing, dressing, using the toilet or eating?  Independent  Indoor Mobility: Did the patient need assistance with walking from room to room (with or without device)? Independent  Stairs: Did the patient need assistance with internal or external stairs (with or without device)? Independent  Functional Cognition: Did the patient need help planning regular tasks such as shopping or remembering to take medications? Independent  Home Assistive Devices / Equipment Home Assistive Devices/Equipment: None Home Equipment: None  Prior Device Use: Indicate devices/aids used by the patient prior to current illness, exacerbation or injury? None of the above  Current Functional Level Cognition  Arousal/Alertness: Awake/alert Overall Cognitive Status: Impaired/Different from baseline Difficult to assess due to: Impaired communication (aphasia) Current Attention Level: Sustained Orientation Level: Oriented to person, Disoriented to situation, Disoriented to time, Disoriented to  place Following Commands: Follows one step commands inconsistently, Follows one step commands with increased time Safety/Judgement: Decreased awareness of safety, Decreased awareness of deficits General Comments: pt with more attention to R visual field today (able to find fingers but not able to state number of fingers being held secondary to aphasia) and 25% accuracy of picking up R UE or LE with L UE or LE; pt shows slight impulsiveness with trying to stand Attention: Sustained Sustained Attention: Appears intact Memory:  (to be assessed) Awareness: Impaired Awareness Impairment: Emergent impairment Problem Solving:  (BD) Safety/Judgment: Impaired    Extremity Assessment (includes Sensation/Coordination)  Upper Extremity Assessment: RUE deficits/detail RUE Deficits / Details: R hemiplegia, completely flaccid, does not respond to deep nail bed pressure RUE Sensation: decreased light touch RUE Coordination: decreased fine motor, decreased gross motor  Lower Extremity Assessment: Defer to PT evaluation    ADLs  Overall ADL's : Needs assistance/impaired Eating/Feeding: Maximal assistance Eating/Feeding Details (indicate cue type and reason): Pt with only a small corner of breakfast consumed. needs more assist during feeding during typical day Grooming: Total assistance Grooming Details (indicate cue type and reason): washed face with wash cloth when it was handed to him Upper Body Dressing : Total assistance Upper Body Dressing Details (indicate cue type and reason): To don anterior hospital gown in supine Lower Body Dressing: Total assistance Lower Body Dressing Details (indicate cue type and reason): To don footwear lying supine in bed Toilet Transfer: Maximal assistance, +2 for physical assistance, +2 for safety/equipment Toilet Transfer Details (indicate cue type and reason): Simulated with stand-pivot transfer to recliner with Max A +2 and +2 hand-held assist. Toileting- Clothing  Manipulation and Hygiene: Total assistance, Bed level Toileting - Clothing Manipulation Details (indicate cue type and reason): total A for all apsects of peri care, rolling in bed Functional mobility during ADLs: Maximal assistance, +2 for physical assistance, +2 for safety/equipment General ADL Comments: Pt max A +2 to total A for ADL at this time, limited by global aphasia, hemiperesis    Mobility  Overal bed mobility: Needs Assistance Bed Mobility: Supine to Sit Rolling: Min assist Sidelying to sit: Mod assist, HOB elevated Supine to sit: Min assist Sit to supine: Mod assist Sit to sidelying: Min assist General bed mobility comments: min assist needed for supine to sit for hooking L LE under R LE to pull off EOB and trunk elevation    Transfers  Overall transfer level: Needs assistance Equipment used: None Transfer via Lift Equipment: Stedy Transfers: Sit to/from Stand Sit to Stand: From elevated surface, Min assist, +2 physical  assistance Stand pivot transfers: Mod assist, From elevated surface General transfer comment: stand without steady was mod assist +2 for trunk elevation and blocking R LE; attempted to side step R and L with max physical assist for weight shifting without ability to move RLE while also blocking the R knee; 2 sets of x5 sit to stands from the stedy with min assist for R hand placement but min guard otherwise    Ambulation / Gait / Stairs / Wheelchair Mobility  Ambulation/Gait Ambulation/Gait assistance: Min guard, Herbalist (Feet): 250 Feet Assistive device: None Gait Pattern/deviations: Step-through pattern, Decreased stride length, Drifts right/left General Gait Details: not attempted Gait velocity: decr    Posture / Balance Dynamic Sitting Balance Sitting balance - Comments: min guard to mod assist for sitting EOB with one posterior LOB Balance Overall balance assessment: Needs assistance Sitting-balance support: Feet supported,  Single extremity supported Sitting balance-Leahy Scale: Poor Sitting balance - Comments: min guard to mod assist for sitting EOB with one posterior LOB Postural control: Right lateral lean Standing balance support: Bilateral upper extremity supported, Single extremity supported, During functional activity Standing balance-Leahy Scale: Poor Standing balance comment: pt mod assist +2 to maintain standing without steady while blocking R LE; pt min guard with Stedy to maintain standing, had ability to maintain even weight distribution and verticality without usage of mirror    Special needs/care consideration Continuous Drip IV: no  and Skin: ecchymosis to left arm, excoriated location to back, buttocks (mid, left, right)     Previous Home Environment (from acute therapy documentation) Living Arrangements: Alone  Lives With: Alone Available Help at Discharge: Family Type of Home: Apartment Home Layout: Two level, Bed/bath upstairs Alternate Level Stairs-Number of Steps: townhome style Home Access: Level entry Bathroom Shower/Tub: Chiropodist: Standard Home Care Services: No Additional Comments: ?accuracy of information by patient using yes/no ?'s. Expressive and receptive difficulties  Discharge Living Setting Plans for Discharge Living Setting: Other (Comment) (will go stay with his son at DC in his house) Type of Home at Discharge: House Discharge Home Layout: Multi-level, Able to live on main level with bedroom/bathroom Alternate Level Stairs-Rails: None (NA) Alternate Level Stairs-Number of Steps: NA Discharge Home Access: Level entry Discharge Bathroom Shower/Tub: Tub/shower unit Discharge Bathroom Toilet: Standard Discharge Bathroom Accessibility: Yes How Accessible: Accessible via walker Does the patient have any problems obtaining your medications?: No  Social/Family/Support Systems Patient Roles: Other (Comment) (full time worker; has brother and  son) Contact Information: son: Shanon Brow: [phone]; brother Junior: [phone] Anticipated Caregiver: son + brother  Anticipated Caregiver's Contact Information: see above Ability/Limitations of Caregiver: Mod A Caregiver Availability: 24/7 Discharge Plan Discussed with Primary Caregiver: Yes (son) Is Caregiver In Agreement with Plan?: Yes Does Caregiver/Family have Issues with Lodging/Transportation while Pt is in Rehab?: No   Goals Patient/Family Goal for Rehab: PT/OT/SLP: Min/Mod A  Expected length of stay: 18-22 days Pt/Family Agrees to Admission and willing to participate: Yes Program Orientation Provided & Reviewed with Pt/Caregiver Including Roles  & Responsibilities: Yes (pt, son, brother)  Barriers to Discharge:  (none identified. )   Decrease burden of Care through IP rehab admission: NA  Possible need for SNF placement upon discharge:Not anticipated; pt has good social support from his son and brother at DC. Anticipate pt can reach Min/Mod A level-a level which his family can assist him.    Patient Condition: This patient's medical and functional status has changed since the consult dated: 05/08/20 in which the Rehabilitation  Physician determined and documented that the patient's condition is appropriate for intensive rehabilitative care in an inpatient rehabilitation facility. See "History of Present Illness" (above) for medical update. Functional changes are: worsening functional status from [place] A for ADLs to Max +2 to Total A, and Min G for transfers to Min A +2 in the stedy . Patient's medical and functional status update has been discussed with the Rehabilitation physician and patient remains appropriate for inpatient rehabilitation. Will admit to inpatient rehab today.  Preadmission Screen Completed By:  Raechel Ache, OT, 05/15/2020 12:18 PM ______________________________________________________________________   Discussed status with Dr. Letta Pate on 05/15/20 at 12:13PM  and received approval for admission today.  Admission Coordinator:  Raechel Ache, time 12:13PM/Date 05/15/20.

## 2020-05-08 NOTE — Progress Notes (Signed)
Subjective:   Hospital day: 1  Overnight event: No acute event  Patient seen at bedside.  He appears comfortable.  States that he is eating well with no trouble swallowing.  Still replies to most questions with "1966" or "2001".  Patient is able to follow simple commands such as raising legs or sit up.  Objective:  Vital signs in last 24 hours: Vitals:   05/07/20 1314 05/07/20 1800 05/07/20 2029 05/08/20 0520  BP: (!) 187/103 (!) 174/100 (!) 169/92 (!) 142/88  Pulse: 70 74 75 65  Resp:  18 17 18   Temp:   98.4 F (36.9 C) 98.2 F (36.8 C)  TempSrc:   Oral Oral  SpO2:   96% 98%  Weight:       CBC Latest Ref Rng & Units 05/08/2020 05/07/2020 05/06/2020  WBC 4.0 - 10.5 K/uL 12.0(H) 11.8(H) -  Hemoglobin 13.0 - 17.0 g/dL 05/08/2020 56.3 89.3  Hematocrit 39 - 52 % 45.3 44.0 44.0  Platelets 150 - 400 K/uL 270 260 -   CMP Latest Ref Rng & Units 05/08/2020 05/07/2020 05/06/2020  Glucose 70 - 99 mg/dL 05/08/2020) 287(G) 811(X)  BUN 6 - 20 mg/dL 10 8 10   Creatinine 0.61 - 1.24 mg/dL 726(O 0.35)  Sodium 135 - 145 mmol/L 132(L) 135 137  Potassium 3.5 - 5.1 mmol/L 3.6 3.9 3.8  Chloride 98 - 111 mmol/L 97(L) 99 103  CO2 22 - 32 mmol/L 23 26 -  Calcium 8.9 - 10.3 mg/dL 9.1 8.9 -  Total Protein 6.5 - 8.1 g/dL - - -  Total Bilirubin 0.3 - 1.2 mg/dL - - -  Alkaline Phos 38 - 126 U/L - - -  AST 15 - 41 U/L - - -  ALT 0 - 44 U/L - - -     Physical Exam  Physical Exam Constitutional:      General: He is not in acute distress. HENT:     Head: Normocephalic.  Eyes:     General:        Right eye: No discharge.        Left eye: No discharge.  Cardiovascular:     Rate and Rhythm: Normal rate and regular rhythm.  Pulmonary:     Effort: No respiratory distress.     Breath sounds: Normal breath sounds.  Musculoskeletal:     Cervical back: Normal range of motion.     Right lower leg: No edema.     Left lower leg: No edema.  Skin:    General: Skin is warm.  Neurological:      Mental Status: He is alert.     Comments: Alert and awake Similar expressive aphasia Able to follow simple commands such as: Raising leg or sitting up.  Unable to follow when was told to show 2 fingers on the left hand. Normal range of motion of all extremities  Psychiatric:        Mood and Affect: Mood normal.     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 -Not a tPA candidate  due to established stroke and penumbra volume. -Not a thrombectomy candidate due to minimal salvagable tissue as determined via imaging by neurology.  Partial global aphasia -Similar partial global aphasia with may be predominantly expressive aphasia.  He is able to follow simple commands intermittently.  Patient is not very frustrated with his current aphasia, likely due to the receptive aphasia component. History of cerebellar CVA Plan:  Workup per neurology - Echocardiogram negative for valvular abnormalities, PFO or thrombus - Tele monitoring.  Unremarkable on review - Continue aspirin 325 mg and Plavix 75 mg for 3 months then aspirin alone - Continue atorvastatin 80 mg. Goal LDL < 70 - Allow for permissive hypertension.  Slowly normalize blood pressure for 5-7 days.  Long-term goal systolic blood pressure 130-150.  Currently on amlodipine 10 mg and lisinopril 20 mg - Consider 30 days cardiac monitor to rule out A. Fib - Repeat CT neck in 2-3 months to follow-up on left ICA - Speech eval - PT/OT recommended CIR  Diabetes Hgb  A1C 8.7.  Patient received 8 units of NovoLog yesterday.  CBG still slightly elevated - Add 10 units of Lantus nightly - sliding scale insulin - CBG monitor  Hyperlipidemia  LDL 122 - Start Atorvastatin 80 mg. Goal LDL < 70  Hypertension -Amlodipine 10 mg and lisinopril 20 mg -Slowly normalize blood pressure in 5-7 days  Ascending aortic aneurysmal dilation up to 4.1 cm - follow up outpatient  Diet: Heart healthy/carb modified IVF: N/A VTE: Lovenox CODE: Full  Prior to Admission Living Arrangement: Home Anticipated Discharge Location: CIR Barriers to Discharge: Medical management Dispo: CIR  Doran Stabler, DO 05/08/2020, 5:45 AM Pager: [phone] After 5pm on weekdays and 1pm on weekends: On Call pager [phone]

## 2020-05-08 NOTE — Progress Notes (Signed)
Physical Therapy Treatment Patient Details Name: Aaron Weiss MRN: [id number] DOB: 1966-01-01 Today's Date: 05/08/2020    History of Present Illness 54 y.o. Caucasian male with PMH of HTN but not seeing doctors for years presented to ED 05/06/20 as code stroke. NIHSS 6 with global partial aphasia; CT head chronic left cerebellar infarct, subacute/chronic left superior parietal infarct, subacute left parietal infarct, acute left frontal infarct. 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     PT Comments    Pt pleasant and agreeable to OOB mobility this day, states "no" he is not nauseous or hurting. Pt inconsistently using yes/no responses appropriately and repeats "3875,6433" or "2951,8841" as responses to PT questions. Pt ambulatory in hallway with occasional min assist to steady, especially when pt is distracted or given mobility directions he does not understand. PT attempted to assess dynamic balance with cues like "step over that line" but pt unable to follow all commands at this time. PT had pt perform hand hygiene at sink, pt requiring max multimodal cuing to perform and had difficulty terminating one step in order to move onto the next. PT continuing to recommend CIR, as pt has significant language and ADL difficulties at this time. Will continue to follow.   Follow Up Recommendations  CIR     Equipment Recommendations  None recommended by PT    Recommendations for Other Services Rehab consult     Precautions / Restrictions Precautions Precautions: Fall;Other (comment) Precaution Comments: permissive HTN 220/120 Restrictions Weight Bearing Restrictions: No    Mobility  Bed Mobility Overal bed mobility: Needs Assistance Bed Mobility: Supine to Sit     Supine to sit: Min guard     General bed mobility comments: for safety, increased time, step-by-step cuing.  Transfers Overall transfer level: Needs assistance Equipment used: 1 person hand held  assist Transfers: Sit to/from Stand Sit to Stand: Min assist         General transfer comment: light min assist to steady  Ambulation/Gait Ambulation/Gait assistance: Min guard;Min assist Gait Distance (Feet): 250 Feet Assistive device: None Gait Pattern/deviations: Step-through pattern;Decreased stride length;Drifts right/left Gait velocity: decr   General Gait Details: min guard for safety, occasional min assist to steady especially with directional changes/distractions. Verbal cuing for hallway navigation, requires visual cues for visual tracking and choosing correct direction   Stairs             Wheelchair Mobility    Modified Rankin (Stroke Patients Only) Modified Rankin (Stroke Patients Only) Pre-Morbid Rankin Score: No symptoms Modified Rankin: Moderately severe disability     Balance Overall balance assessment: Mild deficits observed, not formally tested (wide BOS in standing; could not assess due to incr BP)                                          Cognition Arousal/Alertness: Awake/alert Behavior During Therapy: WFL for tasks assessed/performed Overall Cognitive Status: Difficult to assess Area of Impairment: Problem solving;Safety/judgement                         Safety/Judgement: Decreased awareness of deficits   Problem Solving: Slow processing;Requires verbal cues (repetition) General Comments: Pt continues to states "[id number]" to open-ended questions, follows simple commands 75% for example "move to edge of bed, lift your legs up". Increased difficulty following commands during dynamic standing tasks, requires step-by-step sequencing  assist for washing hands, turning correct direction in hallway. Repeats back to PT "sink, soap, JR (brother)"      Exercises      General Comments General comments (skin integrity, edema, etc.): BP and HR: supine 155/93(106), 71 bpm; sitting EOB 157/94(113), 81 bpm      Pertinent  Vitals/Pain Pain Assessment: No/denies pain    Home Living                      Prior Function            PT Goals (current goals can now be found in the care plan section) Acute Rehab PT Goals PT Goal Formulation: With patient/family Time For Goal Achievement: 05/28/20 Potential to Achieve Goals: Good Progress towards PT goals: Progressing toward goals    Frequency    Min 4X/week      PT Plan Current plan remains appropriate    Co-evaluation              AM-PAC PT "6 Clicks" Mobility   Outcome Measure  Help needed turning from your back to your side while in a flat bed without using bedrails?: A Little Help needed moving from lying on your back to sitting on the side of a flat bed without using bedrails?: A Little Help needed moving to and from a bed to a chair (including a wheelchair)?: A Little Help needed standing up from a chair using your arms (e.g., wheelchair or bedside chair)?: A Little Help needed to walk in hospital room?: A Little Help needed climbing 3-5 steps with a railing? : A Lot 6 Click Score: 17    End of Session Equipment Utilized During Treatment: Gait belt Activity Tolerance: Patient tolerated treatment well Patient left: in bed;with call bell/phone within reach;with chair alarm set;with family/visitor present Nurse Communication: Mobility status (vomiting; elevated BP) PT Visit Diagnosis: Unsteadiness on feet (R26.81);Difficulty in walking, not elsewhere classified (R26.2);Other symptoms and signs involving the nervous system (R29.898)     Time: 7591-6384 PT Time Calculation (min) (ACUTE ONLY): 23 min  Charges:  $Gait Training: 8-22 mins $Therapeutic Activity: 8-22 mins                     Juaquin Ludington E, PT Acute Rehabilitation Services Pager [phone]  Office [phone]    Chanse Kagel D Treg Diemer 05/08/2020, 11:20 AM

## 2020-05-08 NOTE — Consult Note (Signed)
Physical Medicine and Rehabilitation Consult Reason for Consult: Headache with aphasia Referring Physician: [facility]  HPI: Aaron Weiss is a 54 y.o. right-handed male with history of hypertension but not seeing a doctor for years.  Per chart review lives with brother and son.  Reportedly independent prior to admission.  Two-level home bed and bath upstairs.  Presented 05/06/2020 with headache and altered mental status/aphasia.  Blood pressure 150s/90, glucose 258.  Cranial CT scan findings concerning for acute left MCA territory infarction.  No acute hemorrhage.  Remote left cerebellar infarct.  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  Patient did not receive TPA.  CT cerebral perfusion scan partially imaged ascending aortic aneurysmal dilation up to 4.1 cm.  Recommendations for annual imaging followed by CTA or MRA.  MRI follow-up showed large acute early subacute left MCA infarction.  Echocardiogram with ejection fraction of 60 to 65% no wall motion abnormalities grade 1 diastolic dysfunction.  Admission chemistries glucose 261, hemoglobin A1c 8.7, urine drug screen negative.  Currently maintained on aspirin 325 mg and Plavix for CVA prophylaxis x3 months then aspirin alone.  Subcutaneous Lovenox for DVT prophylaxis.  Tolerating a regular consistency diet.  Therapy evaluations completed with recommendations of physical medicine rehab consult.  Review of Systems  Constitutional: Negative for chills and fever.  HENT: Negative for hearing loss.   Eyes: Negative for blurred vision and double vision.  Respiratory: Negative for cough and shortness of breath.   Cardiovascular: Negative for chest pain, palpitations and leg swelling.  Gastrointestinal: Positive for constipation. Negative for heartburn, nausea and vomiting.  Genitourinary: Negative for dysuria and hematuria.  Musculoskeletal: Positive for myalgias.  Skin:  Negative for rash.  Neurological: Positive for speech change, weakness and headaches.  All other systems reviewed and are negative.  History reviewed. No pertinent past medical history. History reviewed. No pertinent surgical history. No family history on file. Social History:  has no history on file for tobacco use, alcohol use, and drug use. Allergies: No Known Allergies Medications Prior to Admission  Medication Sig Dispense Refill  . aspirin EC 81 MG tablet Take 81-162 mg by mouth as needed for mild pain (or headaches). Swallow whole.    . ibuprofen (ADVIL) 200 MG tablet Take 200-400 mg by mouth every 6 (six) hours as needed for mild pain (or headaches).      Home: Home Living Family/patient expects to be discharged to:: Private residence Living Arrangements: Other (Comment) (pt not able to state) Available Help at Discharge: Family Type of Home: Apartment Home Access: Level entry Home Layout: Two level, Bed/bath upstairs Alternate Level Stairs-Number of Steps: townhome style Bathroom Shower/Tub: Engineer, manufacturing systemsTub/shower unit Bathroom Toilet: Standard Home Equipment: None Additional Comments: ?accuracy of information by patient using yes/no ?'s. Expressive and receptive difficulties  Functional History: Prior Function Level of Independence: Independent Comments: believe he was working, but unsure Functional Status:  Mobility: Bed Mobility Overal bed mobility: Needs Assistance Bed Mobility: Supine to Sit, Sit to Supine Supine to sit: Min guard Sit to supine: Min guard General bed mobility comments: moved impulsively supine to sit (guarding for safety); sit to supine nearly needing assist to raise legs (after vomiting) Transfers Overall transfer level: Needs assistance Equipment used: None Transfers: Sit to/from Stand Sit to Stand: Min guard General transfer comment: impulsively coming to stand; wide BOS; no imbalance Ambulation/Gait General Gait Details: unable to assess due to  vomiting and elevated  BP 193/122    ADL: ADL Overall ADL's : Needs assistance/impaired Eating/Feeding: Set up, Sitting Grooming: Set up, Sitting Grooming Details (indicate cue type and reason): washed face with wash cloth when it was handed to him Toilet Transfer: Minimal assistance, Tax adviser Details (indicate cue type and reason): min A for steadying support General ADL Comments: full ADL assessment limited due to pt beginning to vomit when standing at start of session. Pt with little awarenss of this vomiting, not able to communicate appropriately to OT he was feeling ill  Cognition: Cognition Overall Cognitive Status: Difficult to assess Orientation Level: Disoriented X4 Cognition Arousal/Alertness: Awake/alert Behavior During Therapy: WFL for tasks assessed/performed Overall Cognitive Status: Difficult to assess Area of Impairment: Problem solving, Safety/judgement Safety/Judgement: Decreased awareness of deficits Problem Solving: Slow processing, Requires verbal cues (repetition) General Comments: difficult to fully assess 2/2 language deficits. Pt followed ~75% of simple verbal commands. Yes/no questions not always reliable. Showed no awareness of having to vomit despite gagging in standing Difficult to assess due to: Impaired communication (global aphasia)  Blood pressure (!) 142/88, pulse 65, temperature 98.2 F (36.8 C), temperature source Oral, resp. rate 18, weight 96.7 kg, SpO2 98 %. Physical Exam  General: Alert and oriented x 3, No apparent distress HEENT: Head is normocephalic, atraumatic, PERRLA, EOMI, sclera anicteric, oral mucosa pink and moist, dentition intact, ext ear canals clear,  Neck: Supple without JVD or lymphadenopathy Heart: Reg rate and rhythm. No murmurs rubs or gallops Chest: CTA bilaterally without wheezes, rales, or rhonchi; no distress Abdomen: Soft, non-tender, non-distended, bowel sounds positive. Extremities: No clubbing,  cyanosis, or edema. Pulses are 2+ Skin: Clean and intact without signs of breakdown Neuro: Patient is a bit lethargic but arousable.  Global aphasia.  Inconsistent to follow commands but moves all extremities antigravity. Responds to all questions with "1996, 2001" but says thank you at end of exam Psych: Pt's affect is appropriate. Pt is cooperative   Results for orders placed or performed during the hospital encounter of 05/06/20 (from the past 24 hour(s))  Glucose, capillary     Status: Abnormal   Collection Time: 05/07/20  8:52 AM  Result Value Ref Range   Glucose-Capillary 209 (H) 70 - 99 mg/dL  Glucose, capillary     Status: Abnormal   Collection Time: 05/07/20 12:14 PM  Result Value Ref Range   Glucose-Capillary 167 (H) 70 - 99 mg/dL  Glucose, capillary     Status: Abnormal   Collection Time: 05/07/20  6:11 PM  Result Value Ref Range   Glucose-Capillary 169 (H) 70 - 99 mg/dL  Rapid urine drug screen (hospital performed)     Status: None   Collection Time: 05/07/20  6:50 PM  Result Value Ref Range   Opiates NONE DETECTED NONE DETECTED   Cocaine NONE DETECTED NONE DETECTED   Benzodiazepines NONE DETECTED NONE DETECTED   Amphetamines NONE DETECTED NONE DETECTED   Tetrahydrocannabinol NONE DETECTED NONE DETECTED   Barbiturates NONE DETECTED NONE DETECTED  Glucose, capillary     Status: Abnormal   Collection Time: 05/07/20  9:38 PM  Result Value Ref Range   Glucose-Capillary 196 (H) 70 - 99 mg/dL  CBC     Status: Abnormal   Collection Time: 05/08/20  1:11 AM  Result Value Ref Range   WBC 12.0 (H) 4.0 - 10.5 K/uL   RBC 5.17 4.22 - 5.81 MIL/uL   Hemoglobin 15.8 13.0 - 17.0 g/dL   HCT 71.0 39 - 52 %  MCV 87.6 80.0 - 100.0 fL   MCH 30.6 26.0 - 34.0 pg   MCHC 34.9 30.0 - 36.0 g/dL   RDW 08.1 44.8 - 18.5 %   Platelets 270 150 - 400 K/uL   nRBC 0.0 0.0 - 0.2 %  Basic metabolic panel     Status: Abnormal   Collection Time: 05/08/20  1:11 AM  Result Value Ref Range   Sodium  132 (L) 135 - 145 mmol/L   Potassium 3.6 3.5 - 5.1 mmol/L   Chloride 97 (L) 98 - 111 mmol/L   CO2 23 22 - 32 mmol/L   Glucose, Bld 198 (H) 70 - 99 mg/dL   BUN 10 6 - 20 mg/dL   Creatinine, Ser 6.31 0.61 - 1.24 mg/dL   Calcium 9.1 8.9 - 49.7 mg/dL   GFR, Estimated >02 >63 mL/min   Anion gap 12 5 - 15  TSH     Status: None   Collection Time: 05/08/20  1:11 AM  Result Value Ref Range   TSH 1.618 0.350 - 4.500 uIU/mL  Vitamin B12     Status: None   Collection Time: 05/08/20  1:11 AM  Result Value Ref Range   Vitamin B-12 211 180 - 914 pg/mL   CT ANGIO HEAD W OR WO CONTRAST  Result Date: 05/06/2020 CLINICAL DATA:  Stroke/TIA.  Code stroke. EXAM: CT ANGIOGRAPHY HEAD AND NECK CT PERFUSION BRAIN TECHNIQUE: Multidetector CT imaging of the head and neck was performed using the standard protocol during bolus administration of intravenous contrast. Multiplanar CT image reconstructions and MIPs were obtained to evaluate the vascular anatomy. Carotid stenosis measurements (when applicable) are obtained utilizing NASCET criteria, using the distal internal carotid diameter as the denominator. Multiphase CT imaging of the brain was performed following IV bolus contrast injection. Subsequent parametric perfusion maps were calculated using RAPID software. CONTRAST:  40 mL of Omnipaque 350 IV. COMPARISON:  Same day CT head. FINDINGS: CTA NECK FINDINGS Aortic arch: Great vessel origins are patent. Partially imaged ascending aortic aneurysmal dilation up to 4 cm. Right carotid system: No evidence of dissection, stenosis (50% or greater) or occlusion. Left carotid system: The common carotid artery is patent. 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 The internal carotid artery is non-opacified distally within the neck. Vertebral arteries: Right dominant. No evidence of hemodynamically significant stenosis or occlusion. Skeleton: No acute fracture. Other neck: No mass or suspicious adenopathy.  Upper chest: No acute abnormality. Review of the MIP images confirms the above findings CTA HEAD FINDINGS Anterior circulation: Non opacification of the petrous ICA. Faint opacification of the cavernous ICA. Reconstitution at the carotid terminus with opacified left ACA and left MCA proximally. The more distal MCA branches are poorly opacified. The right ICA is patent. The right MCA and bilateral ACA are patent without hemodynamically significant stenosis. Posterior circulation: Right fetal type PCA. No evidence of hemodynamically significant proximal stenosis or large vessel occlusion. Venous sinuses: As permitted by contrast timing, patent. Review of the MIP images confirms the above findings CT Brain Perfusion Findings: ASPECTS: 5 CBF (<30%) Volume: 80mL Perfusion (Tmax>6.0s) volume: 73 mLmL Mismatch Volume: 49 mLmL Infarction Location:Left anterior (M4) and posterior (M6) MCA territories. IMPRESSION: 1. 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 The left A1 ACA and proximal left MCA are opacified; however, there is poor opacification of more distal left MCA branches. 2. RAPID perfusion calculates a left MCA territory penumbra of 49 mL with 24  mL of core infarct, as detailed above. 3. Partially imaged ascending aortic aneurysmal dilation up to 4.1 cm. Recommend annual imaging followup by CTA or MRA. This recommendation follows 2010 ACCF/AHA/AATS/ACR/ASA/SCA/SCAI/SIR/STS/SVM Guidelines for the Diagnosis and Management of Patients with Thoracic Aortic Disease. Circulation. 2010; 121: Z610-R604. Aortic aneurysm NOS (ICD10-I71.9) Code stroke imaging results were communicated on 05/06/2020 at 1:37 pm to provider Dr. Roda Shutters Via telephone, who verbally acknowledged these results. Electronically Signed   By: Feliberto Harts MD   On: 05/06/2020 13:48   CT ANGIO NECK W OR WO CONTRAST  Result Date: 05/06/2020 CLINICAL DATA:  Stroke/TIA.  Code stroke. EXAM: CT ANGIOGRAPHY HEAD  AND NECK CT PERFUSION BRAIN TECHNIQUE: Multidetector CT imaging of the head and neck was performed using the standard protocol during bolus administration of intravenous contrast. Multiplanar CT image reconstructions and MIPs were obtained to evaluate the vascular anatomy. Carotid stenosis measurements (when applicable) are obtained utilizing NASCET criteria, using the distal internal carotid diameter as the denominator. Multiphase CT imaging of the brain was performed following IV bolus contrast injection. Subsequent parametric perfusion maps were calculated using RAPID software. CONTRAST:  40 mL of Omnipaque 350 IV. COMPARISON:  Same day CT head. FINDINGS: CTA NECK FINDINGS Aortic arch: Great vessel origins are patent. Partially imaged ascending aortic aneurysmal dilation up to 4 cm. Right carotid system: No evidence of dissection, stenosis (50% or greater) or occlusion. Left carotid system: The common carotid artery is patent. 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 The internal carotid artery is non-opacified distally within the neck. Vertebral arteries: Right dominant. No evidence of hemodynamically significant stenosis or occlusion. Skeleton: No acute fracture. Other neck: No mass or suspicious adenopathy. Upper chest: No acute abnormality. Review of the MIP images confirms the above findings CTA HEAD FINDINGS Anterior circulation: Non opacification of the petrous ICA. Faint opacification of the cavernous ICA. Reconstitution at the carotid terminus with opacified left ACA and left MCA proximally. The more distal MCA branches are poorly opacified. The right ICA is patent. The right MCA and bilateral ACA are patent without hemodynamically significant stenosis. Posterior circulation: Right fetal type PCA. No evidence of hemodynamically significant proximal stenosis or large vessel occlusion. Venous sinuses: As permitted by contrast timing, patent. Review of the MIP images confirms the  above findings CT Brain Perfusion Findings: ASPECTS: 5 CBF (<30%) Volume: 24mL Perfusion (Tmax>6.0s) volume: 73 mLmL Mismatch Volume: 49 mLmL Infarction Location:Left anterior (M4) and posterior (M6) MCA territories. IMPRESSION: 1. 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 The left A1 ACA and proximal left MCA are opacified; however, there is poor opacification of more distal left MCA branches. 2. RAPID perfusion calculates a left MCA territory penumbra of 49 mL with 24 mL of core infarct, as detailed above. 3. Partially imaged ascending aortic aneurysmal dilation up to 4.1 cm. Recommend annual imaging followup by CTA or MRA. This recommendation follows 2010 ACCF/AHA/AATS/ACR/ASA/SCA/SCAI/SIR/STS/SVM Guidelines for the Diagnosis and Management of Patients with Thoracic Aortic Disease. Circulation. 2010; 121: V409-W119. Aortic aneurysm NOS (ICD10-I71.9) Code stroke imaging results were communicated on 05/06/2020 at 1:37 pm to provider Dr. Roda Shutters Via telephone, who verbally acknowledged these results. Electronically Signed   By: Feliberto Harts MD   On: 05/06/2020 13:48   MR BRAIN WO CONTRAST  Result Date: 05/06/2020 CLINICAL DATA:  Acute confusion and aphasia. EXAM: MRI HEAD WITHOUT CONTRAST TECHNIQUE: Multiplanar, multiecho pulse sequences of the brain and surrounding structures were obtained without intravenous contrast. COMPARISON:  Head CT, CTA, and CTP 05/06/2020 FINDINGS: Brain: As seen on the prior CTP, there is a large acute left MCA territory involving portions of the left frontal lobe (predominantly operculum), posterior temporal lobe, parietal lobe, lateral occipital lobe, and insula. There is a small region of confluent petechial hemorrhage at the left temporoparietal junction, and cytotoxic edema associated with the infarcts is greatest at the left temporoparietal junction potentially indicating that this region of infarct is slightly older (early  subacute) compared to the more anterior infarcts. There is a moderate-sized chronic left cerebellar infarct. A few punctate foci of T2 hyperintensity in the cerebral white matter bilaterally are nonspecific but compatible with minimal chronic small vessel ischemic disease. The ventricles are normal in size. No mass, midline shift, or extra-axial fluid collection is identified. Vascular: Left ICA occlusion as demonstrated on today's CTA. Susceptibility artifact within the left sylvian fissure corresponding to left M2 and M3 branch occlusions. Skull and upper cervical spine: Unremarkable bone marrow signal. Sinuses/Orbits: Unremarkable orbits. Mild mucosal thickening in the paranasal sinuses. Trace left mastoid fluid. Other: None. IMPRESSION: 1. Large acute to early subacute left MCA infarct. 2. Chronic left cerebellar infarct. Electronically Signed   By: Sebastian Ache M.D.   On: 05/06/2020 15:54   CT CEREBRAL PERFUSION W CONTRAST  Result Date: 05/06/2020 CLINICAL DATA:  Stroke/TIA.  Code stroke. EXAM: CT ANGIOGRAPHY HEAD AND NECK CT PERFUSION BRAIN TECHNIQUE: Multidetector CT imaging of the head and neck was performed using the standard protocol during bolus administration of intravenous contrast. Multiplanar CT image reconstructions and MIPs were obtained to evaluate the vascular anatomy. Carotid stenosis measurements (when applicable) are obtained utilizing NASCET criteria, using the distal internal carotid diameter as the denominator. Multiphase CT imaging of the brain was performed following IV bolus contrast injection. Subsequent parametric perfusion maps were calculated using RAPID software. CONTRAST:  40 mL of Omnipaque 350 IV. COMPARISON:  Same day CT head. FINDINGS: CTA NECK FINDINGS Aortic arch: Great vessel origins are patent. Partially imaged ascending aortic aneurysmal dilation up to 4 cm. Right carotid system: No evidence of dissection, stenosis (50% or greater) or occlusion. Left carotid system:  The common carotid artery is patent. 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 The internal carotid artery is non-opacified distally within the neck. Vertebral arteries: Right dominant. No evidence of hemodynamically significant stenosis or occlusion. Skeleton: No acute fracture. Other neck: No mass or suspicious adenopathy. Upper chest: No acute abnormality. Review of the MIP images confirms the above findings CTA HEAD FINDINGS Anterior circulation: Non opacification of the petrous ICA. Faint opacification of the cavernous ICA. Reconstitution at the carotid terminus with opacified left ACA and left MCA proximally. The more distal MCA branches are poorly opacified. The right ICA is patent. The right MCA and bilateral ACA are patent without hemodynamically significant stenosis. Posterior circulation: Right fetal type PCA. No evidence of hemodynamically significant proximal stenosis or large vessel occlusion. Venous sinuses: As permitted by contrast timing, patent. Review of the MIP images confirms the above findings CT Brain Perfusion Findings: ASPECTS: 5 CBF (<30%) Volume: 24mL Perfusion (Tmax>6.0s) volume: 73 mLmL Mismatch Volume: 49 mLmL Infarction Location:Left anterior (M4) and posterior (M6) MCA territories. IMPRESSION: 1. 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 The left A1 ACA and proximal left MCA are opacified; however, there is poor opacification of more distal left MCA branches. 2. RAPID perfusion calculates a left MCA territory penumbra of 49 mL with 24 mL of  core infarct, as detailed above. 3. Partially imaged ascending aortic aneurysmal dilation up to 4.1 cm. Recommend annual imaging followup by CTA or MRA. This recommendation follows 2010 ACCF/AHA/AATS/ACR/ASA/SCA/SCAI/SIR/STS/SVM Guidelines for the Diagnosis and Management of Patients with Thoracic Aortic Disease. Circulation. 2010; 121: Z610-R604. Aortic aneurysm NOS  (ICD10-I71.9) Code stroke imaging results were communicated on 05/06/2020 at 1:37 pm to provider Dr. Roda Shutters Via telephone, who verbally acknowledged these results. Electronically Signed   By: Feliberto Harts MD   On: 05/06/2020 13:48   ECHOCARDIOGRAM COMPLETE  Result Date: 05/07/2020    ECHOCARDIOGRAM REPORT   Patient Name:   Aaron Weiss Date of Exam: 05/07/2020 Medical Rec #:  [id number]         Height: Accession #:    [id number]        Weight:       213.2 lb Date of Birth:  [birth year]         BSA:          2.107 m Patient Age:    54 years          BP:           183/83 mmHg Patient Gender: M                 HR:           61 bpm. Exam Location:  Inpatient Procedure: 2D Echo, Cardiac Doppler and Color Doppler Indications:    Stroke 434.91 / I163.9  History:        Patient has no prior history of Echocardiogram examinations.                 Risk Factors:Diabetes. Carotid artery occlusion with cerebral                 infarction.  Sonographer:    Celesta Gentile RCS Referring Phys: [id number] CHRISTOPHER J TEGELER IMPRESSIONS  1. Left ventricular ejection fraction, by estimation, is 60 to 65%. The left ventricle has normal function. The left ventricle has no regional wall motion abnormalities. There is mild left ventricular hypertrophy. Left ventricular diastolic parameters are consistent with Grade I diastolic dysfunction (impaired relaxation).  2. Right ventricular systolic function is normal. The right ventricular size is normal.  3. The mitral valve is grossly normal. No evidence of mitral valve regurgitation.  4. The aortic valve is tricuspid. Aortic valve regurgitation is not visualized.  5. The inferior vena cava is normal in size with greater than 50% respiratory variability, suggesting right atrial pressure of 3 mmHg. Comparison(s): No prior Echocardiogram. FINDINGS  Left Ventricle: Left ventricular ejection fraction, by estimation, is 60 to 65%. The left ventricle has normal function. The left ventricle  has no regional wall motion abnormalities. The left ventricular internal cavity size was normal in size. There is  mild left ventricular hypertrophy. Left ventricular diastolic parameters are consistent with Grade I diastolic dysfunction (impaired relaxation). Indeterminate filling pressures. Right Ventricle: The right ventricular size is normal. No increase in right ventricular wall thickness. Right ventricular systolic function is normal. Left Atrium: Left atrial size was normal in size. Right Atrium: Right atrial size was normal in size. Pericardium: There is no evidence of pericardial effusion. Mitral Valve: The mitral valve is grossly normal. No evidence of mitral valve regurgitation. Tricuspid Valve: The tricuspid valve is grossly normal. Tricuspid valve regurgitation is trivial. Aortic Valve: The aortic valve is tricuspid. Aortic valve regurgitation is not visualized. Pulmonic Valve: The pulmonic valve was normal in structure. Pulmonic  valve regurgitation is not visualized. Aorta: The aortic root and ascending aorta are structurally normal, with no evidence of dilitation. Venous: The inferior vena cava is normal in size with greater than 50% respiratory variability, suggesting right atrial pressure of 3 mmHg. IAS/Shunts: No atrial level shunt detected by color flow Doppler.  LEFT VENTRICLE PLAX 2D LVIDd:         5.30 cm  Diastology LVIDs:         3.80 cm  LV e' medial:    5.66 cm/s LV PW:         1.20 cm  LV E/e' medial:  10.6 LV IVS:        1.10 cm  LV e' lateral:   7.94 cm/s LVOT diam:     2.00 cm  LV E/e' lateral: 7.6 LV SV:         47 LV SV Index:   23 LVOT Area:     3.14 cm  RIGHT VENTRICLE RV S prime:     14.60 cm/s TAPSE (M-mode): 2.3 cm LEFT ATRIUM             Index       RIGHT ATRIUM           Index LA diam:        4.40 cm 2.09 cm/m  RA Area:     12.00 cm LA Vol (A2C):   44.5 ml 21.12 ml/m RA Volume:   24.70 ml  11.72 ml/m LA Vol (A4C):   48.4 ml 22.97 ml/m LA Biplane Vol: 53.4 ml 25.34 ml/m   AORTIC VALVE LVOT Vmax:   81.50 cm/s LVOT Vmean:  54.700 cm/s LVOT VTI:    0.151 m  AORTA Ao Root diam: 3.20 cm MITRAL VALVE MV Area (PHT): 2.80 cm    SHUNTS MV Decel Time: 271 msec    Systemic VTI:  0.15 m MV E velocity: 60.00 cm/s  Systemic Diam: 2.00 cm MV A velocity: 72.50 cm/s MV E/A ratio:  0.83 Zoila Shutter MD Electronically signed by Zoila Shutter MD Signature Date/Time: 05/07/2020/4:17:19 PM    Final    CT HEAD CODE STROKE WO CONTRAST  Result Date: 05/06/2020 CLINICAL DATA:  Code stroke. Acute stroke suspected. Neuro deficit. EXAM: CT HEAD WITHOUT CONTRAST TECHNIQUE: Contiguous axial images were obtained from the base of the skull through the vertex without intravenous contrast. COMPARISON:  None. FINDINGS: Brain: Abnormal hypodensity and loss of gray-white differentiation involving the left MCA territory, including the insula and frontoparietal cortex. The more frontoparietal area of hypodensity is more hypodense and may be more subacute in chronicity. Mild associated sulcal effacement without midline shift. No evidence of acute hemorrhage. Remote left cerebellar infarct. Vascular: No definite hyperdense vessel. Skull: No acute fracture. Sinuses/Orbits: No acute findings. Other: No mastoid effusions. ASPECTS [facility] Stroke Program Early CT Score) - Ganglionic level infarction (caudate, lentiform nuclei, internal capsule, insula, M1-M3 cortex): 4 - Supraganglionic infarction (M4-M6 cortex): 1 Total score (0-10 with 10 being normal): 5 IMPRESSION: 1. Findings concerning for acute left MCA territory infarct, as detailed above.ASPECTS is 5. 2. This includes an area of hypodensity in the posterior/distal left MCA territory that is more hypodense and may be more subacute in chronicity. 3. No acute hemorrhage. 4. Remote left cerebellar infarct. Code stroke imaging results were communicated on 05/06/2020 at 1:20 pm to provider Dr. Roda Shutters Via telephone. Electronically Signed   By: Feliberto Harts MD   On:  05/06/2020 13:24    Assessment/Plan: Diagnosis: Carotid artery  occlusion with cerebral infarction 1. Does the need for close, 24 hr/day medical supervision in concert with the patient's rehab needs make it unreasonable for this patient to be served in a less intensive setting? Yes 2. Co-Morbidities requiring supervision/potential complications: Left MCA infarcts, hypertensive urgency, HLD, new onset diabetes, ascending aortic aneurysmal dilation up to 4.1cm, leukocytosis 3. Due to bladder management, bowel management, safety, skin/wound care, disease management, medication administration, pain management and patient education, does the patient require 24 hr/day rehab nursing? Yes 4. Does the patient require coordinated care of a physician, rehab nurse, therapy disciplines of PT, OT, SLP to address physical and functional deficits in the context of the above medical diagnosis(es)? Yes Addressing deficits in the following areas: balance, endurance, locomotion, strength, transferring, bowel/bladder control, bathing, dressing, feeding, grooming, toileting, cognition, speech, language and psychosocial support 5. Can the patient actively participate in an intensive therapy program of at least 3 hrs of therapy per day at least 5 days per week? Yes 6. The potential for patient to make measurable gains while on inpatient rehab is excellent 7. Anticipated functional outcomes upon discharge from inpatient rehab are supervision  with PT, supervision with OT, supervision with SLP. 8. Estimated rehab length of stay to reach the above functional goals is: 10-14 days 9. Anticipated discharge destination: Home 10. Overall Rehab/Functional Prognosis: excellent  RECOMMENDATIONS: This patient's condition is appropriate for continued rehabilitative care in the following setting: CIR Patient has agreed to participate in recommended program. Yes Note that insurance prior authorization may be required for reimbursement  for recommended care.  Comment: Thank you for this consult. Admission coordinator to follow.   I have personally performed a face to face diagnostic evaluation, including, but not limited to relevant history and physical exam findings, of this patient and developed relevant assessment and plan.  Additionally, I have reviewed and concur with the physician assistant's documentation above.  Sula Soda, MD   Mcarthur Rossetti Angiulli, PA-C 05/08/2020

## 2020-05-08 NOTE — Progress Notes (Addendum)
Inpatient Diabetes Program Recommendations  AACE/ADA: New Consensus Statement on Inpatient Glycemic Control (2015)  Target Ranges:  Prepandial:   less than 140 mg/dL      Peak postprandial:   less than 180 mg/dL (1-2 hours)      Critically ill patients:  140 - 180 mg/dL   Results for Aaron Weiss, Aaron Weiss (MRN [id number]) as of 05/08/2020 09:55  Ref. Range 05/07/2020 08:52 05/07/2020 12:14 05/07/2020 18:11 05/07/2020 21:38  Glucose-Capillary Latest Ref Range: 70 - 99 mg/dL 785 (H)  5 units NOVOLOG @10 :27am 167 (H)  3 units NOVOLOG  169 (H)  3 units NOVOLOG  196 (H)   Results for Aaron Weiss, Aaron Weiss (MRN [name]) as of 05/08/2020 09:55  Ref. Range 05/08/2020 07:15  Glucose-Capillary Latest Ref Range: 70 - 99 mg/dL 05/10/2020 (H)  3 units NOVOLOG    Results for Aaron Weiss, Aaron Weiss (MRN [name]) as of 05/08/2020 09:55  Ref. Range 05/07/2020 03:47  Hemoglobin A1C Latest Ref Range: 4.8 - 5.6 % 8.7 (H)  (202 mg/dl)    Admit with: CVA/ New Diagnosis of Diabetes   Current Orders: Lantus 10 units QHS      Novolog Moderate Correction Scale/ SSI (0-15 units) TID AC + HS     Note Lantus to start tonight  New diagnosis of Diabetes this admission  Per MD notes, pt has aphasia--able to follow simple commands but still replying to most questions with the answers "1966" and "2001".  Inpatient Rehab currently assessing patient for admission, however, per [facility] notes, son would like for pt to return to [facility] for the remainder of hospital stay and for possible rehab.  Will order educational materials and assess pt for appropriateness.  Will call son as well for diabetes update, etc.   Addendum 3:30pm--Called pt's son [name] by phone this afternoon (spoke with pt earlier and pt gave me permission to call his son with diabetes update).  When spoke with pt earlier this afternoon, I wasn't entirely sure he was comprehending everything I was saying.    Discussed with pt's son new diagnosis  of diabetes, current A1c, (what A1c is and what it measures), Current CBG levels and why we are checking CBGs, current Insulin regimen, basic DM pathophysiology, treatment for diabetes (meds, nutrition, exercise).  Discussed with son that given pt's CBGs and A1c level, pt may be able to manage CBGs at home with oral meds, however, MD will ultimately decide on d/c meds.    Pt's son told me his dad (the patient) does not have PCP and has not sought out regular medical care for a long time.  Son told me that he figured his dad (the patient) probably had undiagnosed diabetes and BP issues so this was not a shock for him.  Strongly encouraged son to encouraged patient to seek regular medical care after d/c for diabetes management.     --Will follow patient during hospitalization--  Leonides Cave RN, MSN, CDE Diabetes Coordinator Inpatient Glycemic Control Team Team Pager: [phone] (8a-5p)

## 2020-05-09 ENCOUNTER — Inpatient Hospital Stay (HOSPITAL_COMMUNITY): Payer: BLUE CROSS/BLUE SHIELD

## 2020-05-09 DIAGNOSIS — G936 Cerebral edema: Secondary | ICD-10-CM

## 2020-05-09 DIAGNOSIS — I63412 Cerebral infarction due to embolism of left middle cerebral artery: Secondary | ICD-10-CM | POA: Diagnosis not present

## 2020-05-09 DIAGNOSIS — I69392 Facial weakness following cerebral infarction: Secondary | ICD-10-CM

## 2020-05-09 DIAGNOSIS — I63232 Cerebral infarction due to unspecified occlusion or stenosis of left carotid arteries: Secondary | ICD-10-CM | POA: Diagnosis not present

## 2020-05-09 DIAGNOSIS — I6522 Occlusion and stenosis of left carotid artery: Secondary | ICD-10-CM | POA: Diagnosis not present

## 2020-05-09 LAB — BASIC METABOLIC PANEL
Anion gap: 10 (ref 5–15)
BUN: 22 mg/dL — ABNORMAL HIGH (ref 6–20)
CO2: 22 mmol/L (ref 22–32)
Calcium: 9 mg/dL (ref 8.9–10.3)
Chloride: 97 mmol/L — ABNORMAL LOW (ref 98–111)
Creatinine, Ser: 1.13 mg/dL (ref 0.61–1.24)
GFR, Estimated: >60 mL/min (ref >60)
Glucose, Bld: 189 mg/dL — ABNORMAL HIGH (ref 70–99)
Potassium: 3.2 mmol/L — ABNORMAL LOW (ref 3.5–5.1)
Sodium: 129 mmol/L — ABNORMAL LOW (ref 135–145)

## 2020-05-09 LAB — LUPUS ANTICOAGULANT PANEL
DRVVT: 37 s (ref 0.0–47.0)
PTT Lupus Anticoagulant: 34 s (ref 0.0–51.9)

## 2020-05-09 LAB — ANTINUCLEAR ANTIBODIES, IFA: ANA Ab, IFA: NEGATIVE

## 2020-05-09 LAB — CBC
HCT: 46.7 % (ref 39.0–52.0)
Hemoglobin: 16.2 g/dL (ref 13.0–17.0)
MCH: 30.7 pg (ref 26.0–34.0)
MCHC: 34.7 g/dL (ref 30.0–36.0)
MCV: 88.6 fL (ref 80.0–100.0)
Platelets: 279 10*3/uL (ref 150–400)
RBC: 5.27 MIL/uL (ref 4.22–5.81)
RDW: 12.7 % (ref 11.5–15.5)
WBC: 13.1 10*3/uL — ABNORMAL HIGH (ref 4.0–10.5)
nRBC: 0 % (ref 0.0–0.2)

## 2020-05-09 LAB — GLUCOSE, CAPILLARY
Glucose-Capillary: 162 mg/dL — ABNORMAL HIGH (ref 70–99)
Glucose-Capillary: 172 mg/dL — ABNORMAL HIGH (ref 70–99)
Glucose-Capillary: 165 mg/dL — ABNORMAL HIGH (ref 70–99)

## 2020-05-09 LAB — OSMOLALITY: Osmolality: 297 mosm/kg — ABNORMAL HIGH (ref 275–295)

## 2020-05-09 LAB — HOMOCYSTEINE: Homocysteine: 11.9 umol/L (ref 0.0–14.5)

## 2020-05-09 LAB — BETA-2-GLYCOPROTEIN I ABS, IGG/M/A
Beta-2 Glyco I IgG: <9 GPI IgG units (ref 0–20)
Beta-2-Glycoprotein I IgA: <9 GPI IgA units (ref 0–25)
Beta-2-Glycoprotein I IgM: <9 GPI IgM units (ref 0–32)

## 2020-05-09 MED ORDER — ALBUMIN HUMAN 5 % IV SOLN: 12.5000 g | Freq: Once | INTRAVENOUS | Status: DC

## 2020-05-09 MED ORDER — POTASSIUM CHLORIDE CRYS ER 20 MEQ PO TBCR
40.0000 meq | EXTENDED_RELEASE_TABLET | Freq: Two times a day (BID) | ORAL | Status: AC
  Administered 2020-05-09: 40 meq via ORAL
  Filled 2020-05-09 (×2): qty 2

## 2020-05-09 MED ORDER — SODIUM CHLORIDE 0.9 % IV SOLN: INTRAVENOUS | Status: DC

## 2020-05-09 MED ORDER — RESOURCE THICKENUP CLEAR PO POWD
ORAL | Status: DC | PRN
  Filled 2020-05-09: qty 125

## 2020-05-09 MED ORDER — RESOURCE THICKENUP CLEAR PO POWD
Freq: Once | ORAL | Status: AC
  Filled 2020-05-09: qty 125

## 2020-05-09 MED ORDER — ALBUMIN HUMAN 5 % IV SOLN
12.5000 g | Freq: Four times a day (QID) | INTRAVENOUS | Status: AC
  Administered 2020-05-09 – 2020-05-10 (×3): 12.5 g via INTRAVENOUS
  Filled 2020-05-09 (×3): qty 250

## 2020-05-09 MED ORDER — IOHEXOL 350 MG/ML SOLN
75.0000 mL | Freq: Once | INTRAVENOUS | Status: AC | PRN
  Administered 2020-05-09: 75 mL via INTRAVENOUS

## 2020-05-09 NOTE — Progress Notes (Signed)
PT Cancellation Note  Patient Details Name: Aaron Weiss MRN: [id number] DOB: 1965/11/06   Cancelled Treatment:    Reason Eval/Treat Not Completed: Fatigue/lethargy limiting ability to participate (per RN pt has had an eventful morning and is fatigued, noted pt had new R sided hemiparesis today, Imaging showed Evolving left MCA territory infarcts.) Will follow.  Ralene Bathe Kistler PT 05/09/2020  Acute Rehabilitation Services Pager [phone] Office [phone]

## 2020-05-09 NOTE — Progress Notes (Addendum)
CSW left message at [facility] in [place] regarding potential admission.  [phone]. Daleen Squibb, MSW, LCSW 11/30/202110:30 AM   CSW made two more phone attempts to reach admissions at [facility] but was only able to leave voicemails. Daleen Squibb, MSW, LCSW 11/30/20211:08 PM

## 2020-05-09 NOTE — Progress Notes (Signed)
° ° °  [facility] has been requested to perform a transesophageal echocardiogram on 05/10/20 for CVA.  Patient continues to have expressive aphasia>receptive aphasia, therefore risks/benefits were discussed with his son, Onalee Hua. After careful review of history and examination, the risks and benefits of transesophageal echocardiogram have been explained to the patient's son, Onalee Hua, including risks of esophageal damage, perforation (1:10,000 risk), bleeding, pharyngeal hematoma as well as other potential complications associated with conscious sedation including aspiration, arrhythmia, respiratory failure and death. Alternatives to treatment were discussed, questions were answered. Patient is willing to proceed.   TEE scheduled for 05/10/20 at 11:30am with Dr. Dr. Anne Fu.  Judy Pimple, PA-C 05/09/2020 2:29 PM

## 2020-05-09 NOTE — Evaluation (Signed)
Clinical/Bedside Swallow Evaluation Patient Details  Name: Aaron Weiss MRN: [id number] Date of Birth: 09/05/1965  Today's Date: 05/09/2020 Time: SLP Start Time (ACUTE ONLY): 1023 SLP Stop Time (ACUTE ONLY): 1034 SLP Time Calculation (min) (ACUTE ONLY): 11 min  Past Medical History: History reviewed. No pertinent past medical history. Past Surgical History: History reviewed. No pertinent surgical history. HPI:  54 y.o. Caucasian male with PMH of HTN but not seeing doctors for years presented to ED 05/06/20 as code stroke. NIHSS 6 with global partial aphasia; CT head chronic left cerebellar infarct, subacute/chronic left superior parietal infarct, subacute left parietal infarct, acute left frontal infarct. 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 Pt acutely developed right side paresis, facial droop and CT revealed extension of stroke and ordered BSE.   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 Leakage on right with some awareness by pt attempting to partially remove with tongue. He had moderate-max incompletely masticated cracker in right buccal cavity removed by therapist. Inadeqauate airway protection suspected with larger straw consecutive sips water not present with smaller sips. His aphasia may prohibit follow through with strategies for volume control, therefore recommend honey thick liquids (no s/s aspiration present) and puree texture. He needs full assist for opening packages, following strategies. Crush meds and check right side oral cavity for pocketed food. Will plan on MBS tomorrow.      SLP Visit Diagnosis: Dysphagia, unspecified (R13.10)    Aspiration Risk  Moderate aspiration risk    Diet Recommendation Dysphagia 1 (Puree);Honey-thick liquid   Liquid Administration via: Cup;No straw Medication Administration: Crushed with puree Supervision:  Staff to assist with self feeding;Patient able to self feed;Full supervision/cueing for compensatory strategies Compensations: Slow rate;Small sips/bites;Lingual sweep for clearance of pocketing;Clear throat intermittently Postural Changes: Seated upright at 90 degrees    Other  Recommendations Oral Care Recommendations: Oral care BID Other Recommendations: Order thickener from pharmacy   Follow up Recommendations Inpatient Rehab      Frequency and Duration min 2x/week  2 weeks       Prognosis Prognosis for Safe Diet Advancement: Good Barriers to Reach Goals: Severity of deficits      Swallow Study   General HPI: 54 y.o. Caucasian male with PMH of HTN but not seeing doctors for years presented to ED 05/06/20 as code stroke. NIHSS 6 with global partial aphasia; CT head chronic left cerebellar infarct, subacute/chronic left superior parietal infarct, subacute left parietal infarct, acute left frontal infarct. 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 Pt acutely developed right side paresis, facial droop and CT revealed extension of stroke and ordered BSE. Type of Study: Bedside Swallow Evaluation Previous Swallow Assessment:  (none) Diet Prior to this Study: NPO Temperature Spikes Noted: No Respiratory Status: Room air History of Recent Intubation: No Behavior/Cognition: Cooperative;Pleasant mood;Other (Comment) (drowsy) Oral Cavity Assessment: Dry Oral Care Completed by SLP: No Oral Cavity - Dentition: Adequate natural dentition Vision:  (limited on right?) Self-Feeding Abilities: Able to feed self;Other (Comment);Needs set up;Needs assist (with left hand) Patient Positioning: Upright in bed Baseline Vocal Quality: Low vocal intensity Volitional Cough: Weak Volitional Swallow: Unable to elicit    Oral/Motor/Sensory Function Overall Oral Motor/Sensory Function: Moderate impairment Facial ROM: Reduced right;Suspected CN VII (facial) dysfunction Facial Symmetry: Abnormal  symmetry right;Suspected CN VII (facial) dysfunction Facial Strength: Reduced right;Suspected CN VII (facial) dysfunction Facial Sensation:  (suspect reduced) Lingual ROM: Reduced  left;Reduced right;Suspected CN XII (hypoglossal) dysfunction Lingual Strength: Reduced;Suspected CN XII (hypoglossal) dysfunction Mandible: Within Functional Limits   Ice Chips Ice chips: Not tested   Thin Liquid Thin Liquid: Impaired Presentation: Cup;Straw Oral Phase Impairments: Reduced labial seal Oral Phase Functional Implications: Right anterior spillage Pharyngeal  Phase Impairments: Cough - Immediate    Nectar Thick Nectar Thick Liquid: Not tested   Honey Thick Honey Thick Liquid: Impaired Presentation: Cup Oral Phase Impairments: Reduced labial seal Oral Phase Functional Implications: Other (comment) (labial residue) Pharyngeal Phase Impairments: Throat Clearing - Immediate;Wet Vocal Quality   Puree Puree: Within functional limits Presentation: Spoon   Solid     Solid: Impaired Oral Phase Impairments: Reduced lingual movement/coordination;Impaired mastication Oral Phase Functional Implications: Right lateral sulci pocketing Pharyngeal Phase Impairments:  (expectorated most)      Aaron Weiss 05/09/2020,11:11 AM   Aaron Weiss.Ed Nurse, children's [phone] Office [phone]

## 2020-05-09 NOTE — Progress Notes (Signed)
  Speech Language Pathology Treatment: Cognitive-Linquistic  Patient Details Name: Aaron Weiss MRN: [id number] DOB: 1965-07-01 Today's Date: 05/09/2020 Time: 1023-1034 SLP Time Calculation (min) (ACUTE ONLY): 11 min  Assessment / Plan / Recommendation Clinical Impression  Language intervention provided during and after swallow assessment. His language presents essentially the same compared to speech-language assessment yesterday and extension of stroke. He continues to perseverate on a few words and may have dysarthria resulting from CN VII involvement- pt drowsy and not as communicative. He could repeat phrases/sentences without significant difficulty and was 60% accurate for auditory word recognition from 2 objects provided in vertical line of vision. Aaron Weiss will continue to need intense language intervention and would benefit from inpatient rehab setting.    HPI HPI: 54 y.o. Caucasian male with PMH of HTN but not seeing doctors for years presented to ED 05/06/20 as code stroke. NIHSS 6 with global partial aphasia; CT head chronic left cerebellar infarct, subacute/chronic left superior parietal infarct, subacute left parietal infarct, acute left frontal infarct. 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 Pt acutely developed right side paresis, facial droop and CT revealed extension of stroke.      SLP Plan  Continue with current plan of care       Recommendations  Medication Administration: Crushed with puree Compensations: Slow rate;Small sips/bites;Lingual sweep for clearance of pocketing;Clear throat intermittently                General recommendations: Rehab consult Oral Care Recommendations: Oral care BID Follow up Recommendations: Inpatient Rehab SLP Visit Diagnosis: Aphasia (R47.01) Plan: Continue with current plan of care                       Royce Macadamia 05/09/2020, 11:14 AM   Breck Coons Lonell Face.Ed Presenter, broadcasting [phone] Office [phone]

## 2020-05-09 NOTE — Progress Notes (Signed)
STROKE TEAM PROGRESS NOTE   INTERVAL HISTORY Code stroke was called for this pt around 8:15am due to new right sided weakness, right gaze and right neglect overnight. Unclear exact time onset. Repeat CT showed new developing left MCA infarct. CTA head and neck showed left M1 proximal occlusion. Of note, pt BP soft at 108/73 at 5am, but 120s-140s throughout the night.   OBJECTIVE Vitals:   05/09/20 0800 05/09/20 1200 05/09/20 1400 05/09/20 1500  BP: 136/84 132/87 125/70   Pulse: 63 60    Resp: 14 (!) 22 (!) 21   Temp: 99.1 F (37.3 C)   99.5 F (37.5 C)  TempSrc: Oral   Oral  SpO2: 97% 94%    Weight:        CBC:  Recent Labs  Lab 05/06/20 1308 05/06/20 1309 05/08/20 0111 05/09/20 0420  WBC 8.9   < > 12.0* 13.1*  NEUTROABS 7.6  --   --   --   HGB 15.7   < > 15.8 16.2  HCT 45.8   < > 45.3 46.7  MCV 89.6   < > 87.6 88.6  PLT 260   < > 270 279   < > = values in this interval not displayed.    Basic Metabolic Panel:  Recent Labs  Lab 05/08/20 0111 05/09/20 0420  NA 132* 129*  K 3.6 3.2*  CL 97* 97*  CO2 23 22  GLUCOSE 198* 189*  BUN 10 22*  CREATININE 0.90 1.13  CALCIUM 9.1 9.0    Lipid Panel:     Component Value Date/Time   CHOL 183 05/07/2020 0347   TRIG 111 05/07/2020 0347   HDL 39 (L) 05/07/2020 0347   CHOLHDL 4.7 05/07/2020 0347   VLDL 22 05/07/2020 0347   LDLCALC 122 (H) 05/07/2020 0347   HgbA1c:  Lab Results  Component Value Date   HGBA1C 8.7 (H) 05/07/2020   Urine Drug Screen:     Component Value Date/Time   LABOPIA NONE DETECTED 05/07/2020 1850   COCAINSCRNUR NONE DETECTED 05/07/2020 1850   LABBENZ NONE DETECTED 05/07/2020 1850   AMPHETMU NONE DETECTED 05/07/2020 1850   THCU NONE DETECTED 05/07/2020 1850   LABBARB NONE DETECTED 05/07/2020 1850    Alcohol Level No results found for: ETH  IMAGING  CT ANGIO HEAD W OR WO CONTRAST  Result Date: 05/09/2020 CLINICAL DATA:  Stroke/TIA.  Assess intracranial arteries. EXAM: CT ANGIOGRAPHY  HEAD AND NECK TECHNIQUE: Multidetector CT imaging of the head and neck was performed using the standard protocol during bolus administration of intravenous contrast. Multiplanar CT image reconstructions and MIPs were obtained to evaluate the vascular anatomy. Carotid stenosis measurements (when applicable) are obtained utilizing NASCET criteria, using the distal internal carotid diameter as the denominator. CONTRAST:  45mL OMNIPAQUE IOHEXOL 350 MG/ML SOLN COMPARISON:  Prior CT and MRI exams from May 06, 2020. FINDINGS: CTA NECK FINDINGS Aortic arch: Great vessel origins are patent. Please see prior CTA for characterization of ascending aortic aneurysm, not imaged on this study. Right carotid system: No evidence of dissection, stenosis (50% or greater) or occlusion. Left carotid system: Similar abrupt occlusion of the proximal cervical internal carotid artery with non opacification of the internal carotid artery in the remainder of the neck. Vertebral arteries: Right dominant. No evidence of dissection, stenosis (50% or greater) or occlusion. Skeleton: No acute fracture. Other neck: No mass or suspicious adenopathy. Upper chest: No acute findings. Review of the MIP images confirms the above findings CTA HEAD FINDINGS Anterior  circulation: The left petrous and cavernous ICA is not opacified, similar to prior. In comparison to prior, there is new occlusion of the proximal left M1 MCA. Minimal opacification of the paraclinoid left ICA is likely secondary to retrograde flow from the left A1 ACA. There is poor opacification of more distal left MCA branches. The right ICA and right MCA and ACA branches are patent without evidence of hemodynamically significant stenosis. Posterior circulation: Right fetal type PCA. No evidence of hemodynamically significant proximal stenosis or large vessel occlusion in the posterior circulation. Venous sinuses: As permitted by contrast timing, patent. 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 New occlusion of the left M1 MCA proximally with poor opacification of distal left MCA branches, compatible with propogation of thrombus. Critical Value/emergent results were called by telephone at the time of interpretation on 05/09/2020 at 8:50 am to provider [name] , who verbally acknowledged these results. Electronically Signed   By: Feliberto Harts MD   On: 05/09/2020 09:12   CT ANGIO NECK W OR WO CONTRAST  Result Date: 05/09/2020 CLINICAL DATA:  Stroke/TIA.  Assess intracranial arteries. EXAM: CT ANGIOGRAPHY HEAD AND NECK TECHNIQUE: Multidetector CT imaging of the head and neck was performed using the standard protocol during bolus administration of intravenous contrast. Multiplanar CT image reconstructions and MIPs were obtained to evaluate the vascular anatomy. Carotid stenosis measurements (when applicable) are obtained utilizing NASCET criteria, using the distal internal carotid diameter as the denominator. CONTRAST:  2mL OMNIPAQUE IOHEXOL 350 MG/ML SOLN COMPARISON:  Prior CT and MRI exams from May 06, 2020. FINDINGS: CTA NECK FINDINGS Aortic arch: Great vessel origins are patent. Please see prior CTA for characterization of ascending aortic aneurysm, not imaged on this study. Right carotid system: No evidence of dissection, stenosis (50% or greater) or occlusion. Left carotid system: Similar abrupt occlusion of the proximal cervical internal carotid artery with non opacification of the internal carotid artery in the remainder of the neck. Vertebral arteries: Right dominant. No evidence of dissection, stenosis (50% or greater) or occlusion. Skeleton: No acute fracture. Other neck: No mass or suspicious adenopathy. Upper chest: No acute findings. Review of the MIP images confirms the above findings CTA HEAD FINDINGS Anterior circulation: The left petrous and cavernous ICA is not opacified, similar to prior. In comparison to prior, there is new  occlusion of the proximal left M1 MCA. Minimal opacification of the paraclinoid left ICA is likely secondary to retrograde flow from the left A1 ACA. There is poor opacification of more distal left MCA branches. The right ICA and right MCA and ACA branches are patent without evidence of hemodynamically significant stenosis. Posterior circulation: Right fetal type PCA. No evidence of hemodynamically significant proximal stenosis or large vessel occlusion in the posterior circulation. Venous sinuses: As permitted by contrast timing, patent. 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 New occlusion of the left M1 MCA proximally with poor opacification of distal left MCA branches, compatible with propogation of thrombus. Critical Value/emergent results were called by telephone at the time of interpretation on 05/09/2020 at 8:50 am to provider [facility] , who verbally acknowledged these results. Electronically Signed   By: Feliberto Harts MD   On: 05/09/2020 09:12   DG Swallowing Func-Speech Pathology  Result Date: 05/09/2020 Objective Swallowing Evaluation: Type of Study: MBS-Modified Barium Swallow Study  Patient Details Name: Aaron Weiss MRN: [id number] Date of Birth: 07/22/1965 Today's Date: 05/09/2020 Time: SLP Start Time (ACUTE ONLY): 1255 -SLP Stop Time (  ACUTE ONLY): 1316 SLP Time Calculation (min) (ACUTE ONLY): 21 min Past Medical History: No past medical history on file. Past Surgical History: No past surgical history on file. HPI: 54 y.o. Caucasian male with PMH of HTN but not seeing doctors for years presented to ED 05/06/20 as code stroke. NIHSS 6 with global partial aphasia; CT head chronic left cerebellar infarct, subacute/chronic left superior parietal infarct, subacute left parietal infarct, acute left frontal infarct. 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 Pt acutely developed right side paresis, facial droop and CT revealed extension of stroke.  Subjective:  alert Assessment / Plan / Recommendation CHL IP CLINICAL IMPRESSIONS 05/09/2020 Clinical Impression Pt presents with a moderate neurogenic dysphagia impacting timing and mobility.  His pharyngeal function was marked by poor timing of laryngeal vestibule closure, incomplete laryngeal elevation, and reduced pharyngeal stripping. Pt demonstrated oral spillage of materials from right anterior side of mouth.  There were mild deficits in cohesion, but generally controlled propulsion into the pharynx.  Honey thick liquids penetrated the larynx and were aspirated to just below the vocal folds, eliciting a delayed but eventual cough response.  Purees penetrated the entrance of the vestibule in trace amounts and inconsistently.  Notable was intermittent backflow from the cervical esophagus into the pyriform space.  Recommend maintaining a dysphagia 1 diet, downgrade liquids to pudding consistency, and crush meds.  Provide full supervision with meals. SLP will follow closely to ensure safety and adequate intake.  He remains a high aspiration risk.  SLP Visit Diagnosis Dysphagia, oropharyngeal phase (R13.12) Attention and concentration deficit following -- Frontal lobe and executive function deficit following -- Impact on safety and function Moderate aspiration risk   CHL IP TREATMENT RECOMMENDATION 05/09/2020 Treatment Recommendations Therapy as outlined in treatment plan below   Prognosis 05/09/2020 Prognosis for Safe Diet Advancement Good Barriers to Reach Goals Severity of deficits Barriers/Prognosis Comment -- CHL IP DIET RECOMMENDATION 05/09/2020 SLP Diet Recommendations Pudding thick liquid;Dysphagia 1 (Puree) solids Liquid Administration via Spoon Medication Administration Crushed with puree Compensations Slow rate;Small sips/bites;Lingual sweep for clearance of pocketing;Clear throat intermittently Postural Changes Remain semi-upright after after feeds/meals (Comment)   CHL IP OTHER RECOMMENDATIONS 05/09/2020  Recommended Consults -- Oral Care Recommendations Oral care BID Other Recommendations Order thickener from pharmacy   CHL IP FOLLOW UP RECOMMENDATIONS 05/09/2020 Follow up Recommendations Inpatient Rehab   CHL IP FREQUENCY AND DURATION 05/09/2020 Speech Therapy Frequency (ACUTE ONLY) min 2x/week Treatment Duration 2 weeks      CHL IP ORAL PHASE 05/09/2020 Oral Phase Impaired Oral - Pudding Teaspoon -- Oral - Pudding Cup -- Oral - Honey Teaspoon Right anterior bolus loss;Decreased bolus cohesion Oral - Honey Cup -- Oral - Nectar Teaspoon -- Oral - Nectar Cup -- Oral - Nectar Straw -- Oral - Thin Teaspoon -- Oral - Thin Cup -- Oral - Thin Straw -- Oral - Puree Right anterior bolus loss Oral - Mech Soft -- Oral - Regular -- Oral - Multi-Consistency -- Oral - Pill -- Oral Phase - Comment --  CHL IP PHARYNGEAL PHASE 05/09/2020 Pharyngeal Phase Impaired Pharyngeal- Pudding Teaspoon -- Pharyngeal -- Pharyngeal- Pudding Cup -- Pharyngeal -- Pharyngeal- Honey Teaspoon Delayed swallow initiation-vallecula;Delayed swallow initiation-pyriform sinuses;Reduced pharyngeal peristalsis;Reduced laryngeal elevation;Reduced airway/laryngeal closure;Reduced tongue base retraction;Penetration/Aspiration before swallow;Penetration/Aspiration during swallow;Pharyngeal residue - valleculae;Trace aspiration Pharyngeal Material enters airway, passes BELOW cords and not ejected out despite cough attempt by patient Pharyngeal- Honey Cup -- Pharyngeal -- Pharyngeal- Nectar Teaspoon -- Pharyngeal -- Pharyngeal- Nectar Cup -- Pharyngeal -- Pharyngeal-  Nectar Straw -- Pharyngeal -- Pharyngeal- Thin Teaspoon -- Pharyngeal -- Pharyngeal- Thin Cup -- Pharyngeal -- Pharyngeal- Thin Straw -- Pharyngeal -- Pharyngeal- Puree Delayed swallow initiation-vallecula;Delayed swallow initiation-pyriform sinuses;Reduced pharyngeal peristalsis;Reduced laryngeal elevation;Reduced airway/laryngeal closure;Reduced tongue base retraction;Penetration/Aspiration before  swallow;Penetration/Aspiration during swallow;Pharyngeal residue - valleculae Pharyngeal Material enters airway, remains ABOVE vocal cords and not ejected out Pharyngeal- Mechanical Soft -- Pharyngeal -- Pharyngeal- Regular -- Pharyngeal -- Pharyngeal- Multi-consistency -- Pharyngeal -- Pharyngeal- Pill -- Pharyngeal -- Pharyngeal Comment --  CHL IP CERVICAL ESOPHAGEAL PHASE 05/09/2020 Cervical Esophageal Phase (No Data) Pudding Teaspoon -- Pudding Cup -- Honey Teaspoon -- Honey Cup -- Nectar Teaspoon -- Nectar Cup -- Nectar Straw -- Thin Teaspoon -- Thin Cup -- Thin Straw -- Puree -- Mechanical Soft -- Regular -- Multi-consistency -- Pill -- Cervical Esophageal Comment -- Blenda Mounts Laurice 05/09/2020, 1:36 PM              CT HEAD CODE STROKE WO CONTRAST`  Result Date: 05/09/2020 CLINICAL DATA:  Code stroke. Neuro deficit, acute stroke suspected. Right-sided weakness. EXAM: CT HEAD WITHOUT CONTRAST TECHNIQUE: Contiguous axial images were obtained from the base of the skull through the vertex without intravenous contrast. COMPARISON:  CT and MRI May 06, 2020 FINDINGS: Brain: Evolving left MCA territory infarcts. Additionally, there is loss of gray-white differentiation in the left frontal lobe in an area that was previously not restricting diffusion. Hyperdensity within the more posterior left MCA territory infarct (for example series 5, image 49 and series 3, image 18). Progressive edema and mass effect with approximately 2 mm of rightward midline shift. Basal cisterns are patent. No hydrocephalus. No mass lesion. Remote left cerebellar infarct. Vascular: Hyperdense M1 MCA. Additionally, hyperdensity of more distal MCA branches. Skull: No acute fracture. Sinuses/Orbits: Scattered paranasal sinus mucosal thickening Other: No mastoid effusions. IMPRESSION: 1. Evolving left MCA territory infarcts. Additionally, there is loss of gray-white differentiation in the left frontal lobe in an area that was  previously not restricting diffusion, concerning for expansion of infarct. MRI could further evaluate if clinically indicated. 2. Progression of associated edema and mass effect with approximately 2 mm of rightward midline shift. 3. Hyperdense proximal and distal left MCA, concerning for thrombosis. See forthcoming CTA for further evaluation. 4. Hyperdensity within the more posterior left MCA territory infarct, which could represent petechial hemorrhage versus thrombosed cortical vessels. Code stroke imaging results were communicated on 05/09/2020 at 8:43 am to provider Dr. Roda Shutters Via telephone, who verbally acknowledged these results. Electronically Signed   By: Feliberto Harts MD   On: 05/09/2020 08:49    CT ANGIO HEAD W OR WO CONTRAST CT ANGIO NECK W OR WO CONTRAST CT CEREBRAL PERFUSION W CONTRAST 05/06/2020 IMPRESSION:  1. 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 The left A1 ACA and proximal left MCA are opacified; however, there is poor opacification of more distal left MCA branches.  2. RAPID perfusion calculates a left MCA territory penumbra of 49 mL with 24 mL of core infarct, as detailed above.  3. Partially imaged ascending aortic aneurysmal dilation up to 4.1 cm. Recommend annual imaging followup by CTA or MRA.  This recommendation follows 2010 ACCF/AHA/AATS/ACR/ASA/SCA/SCAI/SIR/STS/SVM Guidelines for the Diagnosis and Management of Patients with Thoracic Aortic Disease. Circulation. 2010; 121: W119-J478. Aortic aneurysm NOS (ICD10-I71.9)   MR BRAIN WO CONTRAST 05/06/2020 IMPRESSION:  1. Large acute to early subacute left MCA infarct.  2. Chronic left cerebellar infarct.   CT HEAD CODE STROKE WO CONTRAST  05/06/2020 IMPRESSION:  1. Findings concerning for acute left MCA territory infarct, as detailed above.ASPECTS is 5.  2. This includes an area of hypodensity in the posterior/distal left MCA territory that is more hypodense and may be  more subacute in chronicity.  3. No acute hemorrhage.  4. Remote left cerebellar infarct.   Transthoracic Echocardiogram  1. Left ventricular ejection fraction, by estimation, is 60 to 65%. The left ventricle has normal function. The left ventricle has no regional wall motion abnormalities. There is mild left ventricular hypertrophy. Left ventricular diastolic parameters are consistent with Grade I diastolic dysfunction (impaired relaxation).  2. Right ventricular systolic function is normal. The right ventricular size is normal.  3. The mitral valve is grossly normal. No evidence of mitral valve regurgitation.  4. The aortic valve is tricuspid. Aortic valve regurgitation is not visualized.  5. The inferior vena cava is normal in size with greater than 50% respiratory variability, suggesting right atrial pressure of 3 mmHg.   ECG - SR rate 78 BPM. (See cardiology reading for complete details)  PHYSICAL EXAM  Blood pressure 125/70, pulse 60, temperature 99.5 F (37.5 C), temperature source Oral, resp. rate (!) 21, weight 95.2 kg, SpO2 94 %.  General - well nourished, well developed, in no apparent distress.    Ophthalmologic - fundi not visualized due to noncooperation.    Cardiovascular - regular rhythm and rate  Neuro - slightly drowsy but eyes open, global aphasia, able to say "yes/no" "OK", but perseverated on "1966 to 2001" for most of the questions. Able to follow most simple commands but not all the commands. Not able to name but able to repeat simple 3-word sentences. Left gaze not cross midline with right neglect. Not blinking to visual threat on the right. Right facial droop, right UE flaccid, right LE withdraw 2+/5 to pain. LUE and LLE 5/5. Sensation, coordination not cooperative and gait not tested.  ASSESSMENT/PLAN [name] with history of HTN but not seeing doctors for years brought to ED as code stroke. presenting with confusion, speech  difficulties, and glucose 258 with no previous hx of diabetes.  He did not receive IV t-PA due to established stroke > 1/3 MCA. Neuro worsening (new R hemiparesis and facial droop) in hospital with stroke progression on imaging 11/30. Not a candidate for intervention w/ worsening.   Stroke: left MCA infarcts due to left ICA and M2 branches occlusion, source unclear, ? Left ICA dissection vs. Uncontrolled risk factors Stroke extension - left M1 proximal occlusion, likely due to stump emboli vs. Slow MCA flow in the setting of soft BP  CT Head 11/27 - Findings concerning for acute left MCA territory infarct, as detailed above. ASPECTS is 5. This includes an area of hypodensity in the posterior/distal left MCA territory that is more hypodense and may be more subacute in chronicity.   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 The left A1 ACA and proximal left MCA are opacified; however, there is poor opacification of more distal left MCA branches  CTP 11/27 - penumbra of 49 mL with 24 mL of core infarct  MRI head - Large acute to early subacute left MCA infarcts. Chronic left cerebellar infarct.   2D Echo EF 60 to 65%  CT Head Code Stroke 11/30 evolving L MCA territory infarcts, now including L frontal lobe infarct. Edema w/ 54mm midline shift. Hyperdense distal L MCA.  CTA Head & neck 11/30 proximal L cervical ICA occlusion. New proximal L M1 occlusion w/ poor opacification distal MCA branches c/w propogation of thrombosis  TCD bubble study no window  TEE Wed at 0830 to rule out cardiac source of stroke  Need to repeat CTA neck in 2-3 months for follow up of left ICA status  Consider 30 day cardiac event monitoring to rule out afib  [employer] Corona Virus 2 - negative  LDL - 122  HgbA1c - 8.7  Hypercoagulable work up - neg so far but some still pending  UDS - neg  VTE prophylaxis -Lovenox  aspirin 81 mg daily prior to  admission, now on aspirin 325 mg daily and clopidogrel 75 mg daily DAPT for 3 months and then ASA alone given large vessel occlusion.   Therapy recommendations:  CIR  Disposition:  Pending  Cerebral edema  Now with left MCA extension, pt has MLS with 2mm  Risk of developing cerebral edema with malignant left MCA syndrome  Close neuro check Q2h  If mental status changes, need to repeat CT and may consider transfer to ICU for 3% saline.  Discussed with primary team  Hypertensive urgency  Home BP meds: none   Was on amlodipine 10 and lisinopril 20, now off . Initially high BP then BP soft 120-140s overnight 11/29-11/30 . On IVF . Albumin 4 doses  . Long-term BP goal 130-150 given left ICA occlusion  Hyperlipidemia  Home Lipid lowering medication: none   LDL 122, goal < 70  Current lipid lowering medication: Lipitor 80 mg daily   Continue statin at discharge  Diabetes, new diagnosis  Home diabetic meds: none   Current diabetic meds: SSI   HgbA1c 8.7, goal < 7.0  CBG monitoring  Close PCP follow up  DM education for new DM  Dysphagia . Secondary to stroke progression . Cleared for D1 puree w/ pudding thick liquids . On IVF . Speech on board   Hyponatremia   Na 135->132->129   likely SIADH, but DDx including SWS   Management per primary team  Other Stroke Risk Factors  ETOH - social drinker  Obesity   Previous strokes by imaging - left cerebellar  Other Active Problems  Code status - Full code   Partially imaged ascending aortic aneurysmal dilation up to 4.1 cm. Recommend annual imaging followup by CTA or MRA.  Leukocytosis - WBC's - 8.9->11.8->12.0->13.1  (afebrile)  Vomiting 11/28 - one episode with standing up - zofran PRN  Hospital day # 3  Patient condition worsened within the last 24 hours, has developed new right hemiplegia and right neglect, continues to have aphasia, and I have ordered CT, CTA head and neck and confirmed new MCA  infarct. I discussed with Dr. Sandre Kittyaines and pt brother Aaron Weiss over the phone. I spent  35 minutes in total face-to-face time with the patient, more than 50% of which was spent in counseling and coordination of care, reviewing test results, images and medication, and discussing the diagnosis, treatment plan and potential prognosis. This patient's care requiresreview of multiple databases, neurological assessment, discussion with family, other specialists and medical decision making of high complexity. I had long discussion with brother over the phone at bedside, updated pt current condition, treatment plan and potential prognosis, and answered all the questions. He expressed understanding and appreciation. He will relay the info to pt son.    Marvel PlanJindong Mateus Rewerts, MD PhD Stroke Neurology 05/09/2020 3:47 PM    To contact Stroke Continuity provider, please  refer to [email or web address] After hours, contact General Neurology

## 2020-05-09 NOTE — Code Documentation (Signed)
Stroke Response Nurse Documentation Code Documentation  Aaron Weiss is a 54 y.o. male admitted to [facility] as a code stroke and found to have a left ICA with a left MCA territory infarct. On admission he was notable for mild aphasia, partial hemianopia, disorientation, inattention, and difficulty following commands. Early this morning, during the night shift RN assessment, the RN noted the pt to be weaker on his right side. She reported this to the on-coming RN. Upon the primary RN assessment at 0800 she noticed the pt to be flaccid on the right side and notified the primary attending and activated a code stroke. On aspirin 325 mg daily and aspirin 300 mg suppository daily. Stroke team at the bedside. Patient to CT with team. NIHSS 22, see documentation for details and code stroke times. Patient with disoriented, not following commands, left gaze preference, right hemianopia, facial droop, right arm weakness, right leg weakness, right decreased sensation, global aphasia, dysarthria and sensory neglect on exam. The following imaging was completed: CT, CTA head and neck. Patient is not a candidate for tPA.    Plan: Q2H mNIHSS & VS x 24 hours. One complete NIHSS per shift.   Handoff with Ashley Mariner, LPN  Jennye Moccasin  Rapid Response RN

## 2020-05-09 NOTE — Progress Notes (Addendum)
Upon completing morning assessment I found pt. To be completely flaccid on right side with facial drooping. Night shift nurse stated in report that she noticed the right side was weaker this morning. Immediately notified Dr. Sandre Kitty of the change from yesterday to this morning.  Stat head CT ordered. Dr. Sandre Kitty in route to bedside. Code Stroke Initiated. Will continue to monitor.

## 2020-05-09 NOTE — H&P (View-Only) (Signed)
STROKE TEAM PROGRESS NOTE   INTERVAL HISTORY Code stroke was called for this pt around 8:15am due to new right sided weakness, right gaze and right neglect overnight. Unclear exact time onset. Repeat CT showed new developing left MCA infarct. CTA head and neck showed left M1 proximal occlusion. Of note, pt BP soft at 108/73 at 5am, but 120s-140s throughout the night.   OBJECTIVE Vitals:   05/09/20 0800 05/09/20 1200 05/09/20 1400 05/09/20 1500  BP: 136/84 132/87 125/70   Pulse: 63 60    Resp: 14 (!) 22 (!) 21   Temp: 99.1 F (37.3 C)   99.5 F (37.5 C)  TempSrc: Oral   Oral  SpO2: 97% 94%    Weight:        CBC:  Recent Labs  Lab 05/06/20 1308 05/06/20 1309 05/08/20 0111 05/09/20 0420  WBC 8.9   < > 12.0* 13.1*  NEUTROABS 7.6  --   --   --   HGB 15.7   < > 15.8 16.2  HCT 45.8   < > 45.3 46.7  MCV 89.6   < > 87.6 88.6  PLT 260   < > 270 279   < > = values in this interval not displayed.    Basic Metabolic Panel:  Recent Labs  Lab 05/08/20 0111 05/09/20 0420  NA 132* 129*  K 3.6 3.2*  CL 97* 97*  CO2 23 22  GLUCOSE 198* 189*  BUN 10 22*  CREATININE 0.90 1.13  CALCIUM 9.1 9.0    Lipid Panel:     Component Value Date/Time   CHOL 183 05/07/2020 0347   TRIG 111 05/07/2020 0347   HDL 39 (L) 05/07/2020 0347   CHOLHDL 4.7 05/07/2020 0347   VLDL 22 05/07/2020 0347   LDLCALC 122 (H) 05/07/2020 0347   HgbA1c:  Lab Results  Component Value Date   HGBA1C 8.7 (H) 05/07/2020   Urine Drug Screen:     Component Value Date/Time   LABOPIA NONE DETECTED 05/07/2020 1850   COCAINSCRNUR NONE DETECTED 05/07/2020 1850   LABBENZ NONE DETECTED 05/07/2020 1850   AMPHETMU NONE DETECTED 05/07/2020 1850   THCU NONE DETECTED 05/07/2020 1850   LABBARB NONE DETECTED 05/07/2020 1850    Alcohol Level No results found for: ETH  IMAGING  CT ANGIO HEAD W OR WO CONTRAST  Result Date: 05/09/2020 CLINICAL DATA:  Stroke/TIA.  Assess intracranial arteries. EXAM: CT ANGIOGRAPHY  HEAD AND NECK TECHNIQUE: Multidetector CT imaging of the head and neck was performed using the standard protocol during bolus administration of intravenous contrast. Multiplanar CT image reconstructions and MIPs were obtained to evaluate the vascular anatomy. Carotid stenosis measurements (when applicable) are obtained utilizing NASCET criteria, using the distal internal carotid diameter as the denominator. CONTRAST:  45mL OMNIPAQUE IOHEXOL 350 MG/ML SOLN COMPARISON:  Prior CT and MRI exams from May 06, 2020. FINDINGS: CTA NECK FINDINGS Aortic arch: Great vessel origins are patent. Please see prior CTA for characterization of ascending aortic aneurysm, not imaged on this study. Right carotid system: No evidence of dissection, stenosis (50% or greater) or occlusion. Left carotid system: Similar abrupt occlusion of the proximal cervical internal carotid artery with non opacification of the internal carotid artery in the remainder of the neck. Vertebral arteries: Right dominant. No evidence of dissection, stenosis (50% or greater) or occlusion. Skeleton: No acute fracture. Other neck: No mass or suspicious adenopathy. Upper chest: No acute findings. Review of the MIP images confirms the above findings CTA HEAD FINDINGS Anterior  circulation: The left petrous and cavernous ICA is not opacified, similar to prior. In comparison to prior, there is new occlusion of the proximal left M1 MCA. Minimal opacification of the paraclinoid left ICA is likely secondary to retrograde flow from the left A1 ACA. There is poor opacification of more distal left MCA branches. The right ICA and right MCA and ACA branches are patent without evidence of hemodynamically significant stenosis. Posterior circulation: Right fetal type PCA. No evidence of hemodynamically significant proximal stenosis or large vessel occlusion in the posterior circulation. Venous sinuses: As permitted by contrast timing, patent. 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 New occlusion of the left M1 MCA proximally with poor opacification of distal left MCA branches, compatible with propogation of thrombus. Critical Value/emergent results were called by telephone at the time of interpretation on 05/09/2020 at 8:50 am to provider [name] , who verbally acknowledged these results. Electronically Signed   By: Feliberto Harts MD   On: 05/09/2020 09:12   CT ANGIO NECK W OR WO CONTRAST  Result Date: 05/09/2020 CLINICAL DATA:  Stroke/TIA.  Assess intracranial arteries. EXAM: CT ANGIOGRAPHY HEAD AND NECK TECHNIQUE: Multidetector CT imaging of the head and neck was performed using the standard protocol during bolus administration of intravenous contrast. Multiplanar CT image reconstructions and MIPs were obtained to evaluate the vascular anatomy. Carotid stenosis measurements (when applicable) are obtained utilizing NASCET criteria, using the distal internal carotid diameter as the denominator. CONTRAST:  2mL OMNIPAQUE IOHEXOL 350 MG/ML SOLN COMPARISON:  Prior CT and MRI exams from May 06, 2020. FINDINGS: CTA NECK FINDINGS Aortic arch: Great vessel origins are patent. Please see prior CTA for characterization of ascending aortic aneurysm, not imaged on this study. Right carotid system: No evidence of dissection, stenosis (50% or greater) or occlusion. Left carotid system: Similar abrupt occlusion of the proximal cervical internal carotid artery with non opacification of the internal carotid artery in the remainder of the neck. Vertebral arteries: Right dominant. No evidence of dissection, stenosis (50% or greater) or occlusion. Skeleton: No acute fracture. Other neck: No mass or suspicious adenopathy. Upper chest: No acute findings. Review of the MIP images confirms the above findings CTA HEAD FINDINGS Anterior circulation: The left petrous and cavernous ICA is not opacified, similar to prior. In comparison to prior, there is new  occlusion of the proximal left M1 MCA. Minimal opacification of the paraclinoid left ICA is likely secondary to retrograde flow from the left A1 ACA. There is poor opacification of more distal left MCA branches. The right ICA and right MCA and ACA branches are patent without evidence of hemodynamically significant stenosis. Posterior circulation: Right fetal type PCA. No evidence of hemodynamically significant proximal stenosis or large vessel occlusion in the posterior circulation. Venous sinuses: As permitted by contrast timing, patent. 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 New occlusion of the left M1 MCA proximally with poor opacification of distal left MCA branches, compatible with propogation of thrombus. Critical Value/emergent results were called by telephone at the time of interpretation on 05/09/2020 at 8:50 am to provider [facility] , who verbally acknowledged these results. Electronically Signed   By: Feliberto Harts MD   On: 05/09/2020 09:12   DG Swallowing Func-Speech Pathology  Result Date: 05/09/2020 Objective Swallowing Evaluation: Type of Study: MBS-Modified Barium Swallow Study  Patient Details Name: Aaron Weiss MRN: [id number] Date of Birth: 07/22/1965 Today's Date: 05/09/2020 Time: SLP Start Time (ACUTE ONLY): 1255 -SLP Stop Time (  ACUTE ONLY): 1316 SLP Time Calculation (min) (ACUTE ONLY): 21 min Past Medical History: No past medical history on file. Past Surgical History: No past surgical history on file. HPI: 54 y.o. Caucasian male with PMH of HTN but not seeing doctors for years presented to ED 05/06/20 as code stroke. NIHSS 6 with global partial aphasia; CT head chronic left cerebellar infarct, subacute/chronic left superior parietal infarct, subacute left parietal infarct, acute left frontal infarct. 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 Pt acutely developed right side paresis, facial droop and CT revealed extension of stroke.  Subjective:  alert Assessment / Plan / Recommendation CHL IP CLINICAL IMPRESSIONS 05/09/2020 Clinical Impression Pt presents with a moderate neurogenic dysphagia impacting timing and mobility.  His pharyngeal function was marked by poor timing of laryngeal vestibule closure, incomplete laryngeal elevation, and reduced pharyngeal stripping. Pt demonstrated oral spillage of materials from right anterior side of mouth.  There were mild deficits in cohesion, but generally controlled propulsion into the pharynx.  Honey thick liquids penetrated the larynx and were aspirated to just below the vocal folds, eliciting a delayed but eventual cough response.  Purees penetrated the entrance of the vestibule in trace amounts and inconsistently.  Notable was intermittent backflow from the cervical esophagus into the pyriform space.  Recommend maintaining a dysphagia 1 diet, downgrade liquids to pudding consistency, and crush meds.  Provide full supervision with meals. SLP will follow closely to ensure safety and adequate intake.  He remains a high aspiration risk.  SLP Visit Diagnosis Dysphagia, oropharyngeal phase (R13.12) Attention and concentration deficit following -- Frontal lobe and executive function deficit following -- Impact on safety and function Moderate aspiration risk   CHL IP TREATMENT RECOMMENDATION 05/09/2020 Treatment Recommendations Therapy as outlined in treatment plan below   Prognosis 05/09/2020 Prognosis for Safe Diet Advancement Good Barriers to Reach Goals Severity of deficits Barriers/Prognosis Comment -- CHL IP DIET RECOMMENDATION 05/09/2020 SLP Diet Recommendations Pudding thick liquid;Dysphagia 1 (Puree) solids Liquid Administration via Spoon Medication Administration Crushed with puree Compensations Slow rate;Small sips/bites;Lingual sweep for clearance of pocketing;Clear throat intermittently Postural Changes Remain semi-upright after after feeds/meals (Comment)   CHL IP OTHER RECOMMENDATIONS 05/09/2020  Recommended Consults -- Oral Care Recommendations Oral care BID Other Recommendations Order thickener from pharmacy   CHL IP FOLLOW UP RECOMMENDATIONS 05/09/2020 Follow up Recommendations Inpatient Rehab   CHL IP FREQUENCY AND DURATION 05/09/2020 Speech Therapy Frequency (ACUTE ONLY) min 2x/week Treatment Duration 2 weeks      CHL IP ORAL PHASE 05/09/2020 Oral Phase Impaired Oral - Pudding Teaspoon -- Oral - Pudding Cup -- Oral - Honey Teaspoon Right anterior bolus loss;Decreased bolus cohesion Oral - Honey Cup -- Oral - Nectar Teaspoon -- Oral - Nectar Cup -- Oral - Nectar Straw -- Oral - Thin Teaspoon -- Oral - Thin Cup -- Oral - Thin Straw -- Oral - Puree Right anterior bolus loss Oral - Mech Soft -- Oral - Regular -- Oral - Multi-Consistency -- Oral - Pill -- Oral Phase - Comment --  CHL IP PHARYNGEAL PHASE 05/09/2020 Pharyngeal Phase Impaired Pharyngeal- Pudding Teaspoon -- Pharyngeal -- Pharyngeal- Pudding Cup -- Pharyngeal -- Pharyngeal- Honey Teaspoon Delayed swallow initiation-vallecula;Delayed swallow initiation-pyriform sinuses;Reduced pharyngeal peristalsis;Reduced laryngeal elevation;Reduced airway/laryngeal closure;Reduced tongue base retraction;Penetration/Aspiration before swallow;Penetration/Aspiration during swallow;Pharyngeal residue - valleculae;Trace aspiration Pharyngeal Material enters airway, passes BELOW cords and not ejected out despite cough attempt by patient Pharyngeal- Honey Cup -- Pharyngeal -- Pharyngeal- Nectar Teaspoon -- Pharyngeal -- Pharyngeal- Nectar Cup -- Pharyngeal -- Pharyngeal-  Nectar Straw -- Pharyngeal -- Pharyngeal- Thin Teaspoon -- Pharyngeal -- Pharyngeal- Thin Cup -- Pharyngeal -- Pharyngeal- Thin Straw -- Pharyngeal -- Pharyngeal- Puree Delayed swallow initiation-vallecula;Delayed swallow initiation-pyriform sinuses;Reduced pharyngeal peristalsis;Reduced laryngeal elevation;Reduced airway/laryngeal closure;Reduced tongue base retraction;Penetration/Aspiration before  swallow;Penetration/Aspiration during swallow;Pharyngeal residue - valleculae Pharyngeal Material enters airway, remains ABOVE vocal cords and not ejected out Pharyngeal- Mechanical Soft -- Pharyngeal -- Pharyngeal- Regular -- Pharyngeal -- Pharyngeal- Multi-consistency -- Pharyngeal -- Pharyngeal- Pill -- Pharyngeal -- Pharyngeal Comment --  CHL IP CERVICAL ESOPHAGEAL PHASE 05/09/2020 Cervical Esophageal Phase (No Data) Pudding Teaspoon -- Pudding Cup -- Honey Teaspoon -- Honey Cup -- Nectar Teaspoon -- Nectar Cup -- Nectar Straw -- Thin Teaspoon -- Thin Cup -- Thin Straw -- Puree -- Mechanical Soft -- Regular -- Multi-consistency -- Pill -- Cervical Esophageal Comment -- Couture, Amanda Laurice 05/09/2020, 1:36 PM              CT HEAD CODE STROKE WO CONTRAST`  Result Date: 05/09/2020 CLINICAL DATA:  Code stroke. Neuro deficit, acute stroke suspected. Right-sided weakness. EXAM: CT HEAD WITHOUT CONTRAST TECHNIQUE: Contiguous axial images were obtained from the base of the skull through the vertex without intravenous contrast. COMPARISON:  CT and MRI May 06, 2020 FINDINGS: Brain: Evolving left MCA territory infarcts. Additionally, there is loss of gray-white differentiation in the left frontal lobe in an area that was previously not restricting diffusion. Hyperdensity within the more posterior left MCA territory infarct (for example series 5, image 49 and series 3, image 18). Progressive edema and mass effect with approximately 2 mm of rightward midline shift. Basal cisterns are patent. No hydrocephalus. No mass lesion. Remote left cerebellar infarct. Vascular: Hyperdense M1 MCA. Additionally, hyperdensity of more distal MCA branches. Skull: No acute fracture. Sinuses/Orbits: Scattered paranasal sinus mucosal thickening Other: No mastoid effusions. IMPRESSION: 1. Evolving left MCA territory infarcts. Additionally, there is loss of gray-white differentiation in the left frontal lobe in an area that was  previously not restricting diffusion, concerning for expansion of infarct. MRI could further evaluate if clinically indicated. 2. Progression of associated edema and mass effect with approximately 2 mm of rightward midline shift. 3. Hyperdense proximal and distal left MCA, concerning for thrombosis. See forthcoming CTA for further evaluation. 4. Hyperdensity within the more posterior left MCA territory infarct, which could represent petechial hemorrhage versus thrombosed cortical vessels. Code stroke imaging results were communicated on 05/09/2020 at 8:43 am to provider Dr. Reid Nawrot Via telephone, who verbally acknowledged these results. Electronically Signed   By: Frederick S Jones MD   On: 05/09/2020 08:49    CT ANGIO HEAD W OR WO CONTRAST CT ANGIO NECK W OR WO CONTRAST CT CEREBRAL PERFUSION W CONTRAST 05/06/2020 IMPRESSION:  1. 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 The left A1 ACA and proximal left MCA are opacified; however, there is poor opacification of more distal left MCA branches.  2. RAPID perfusion calculates a left MCA territory penumbra of 49 mL with 24 mL of core infarct, as detailed above.  3. Partially imaged ascending aortic aneurysmal dilation up to 4.1 cm. Recommend annual imaging followup by CTA or MRA.  This recommendation follows 2010 ACCF/AHA/AATS/ACR/ASA/SCA/SCAI/SIR/STS/SVM Guidelines for the Diagnosis and Management of Patients with Thoracic Aortic Disease. Circulation. 2010; 121: E266-e369. Aortic aneurysm NOS (ICD10-I71.9)   MR BRAIN WO CONTRAST 05/06/2020 IMPRESSION:  1. Large acute to early subacute left MCA infarct.  2. Chronic left cerebellar infarct.   CT HEAD CODE STROKE WO CONTRAST   05/06/2020 IMPRESSION:  1. Findings concerning for acute left MCA territory infarct, as detailed above.ASPECTS is 5.  2. This includes an area of hypodensity in the posterior/distal left MCA territory that is more hypodense and may be  more subacute in chronicity.  3. No acute hemorrhage.  4. Remote left cerebellar infarct.   Transthoracic Echocardiogram  1. Left ventricular ejection fraction, by estimation, is 60 to 65%. The left ventricle has normal function. The left ventricle has no regional wall motion abnormalities. There is mild left ventricular hypertrophy. Left ventricular diastolic parameters are consistent with Grade I diastolic dysfunction (impaired relaxation).  2. Right ventricular systolic function is normal. The right ventricular size is normal.  3. The mitral valve is grossly normal. No evidence of mitral valve regurgitation.  4. The aortic valve is tricuspid. Aortic valve regurgitation is not visualized.  5. The inferior vena cava is normal in size with greater than 50% respiratory variability, suggesting right atrial pressure of 3 mmHg.   ECG - SR rate 78 BPM. (See cardiology reading for complete details)  PHYSICAL EXAM  Blood pressure 125/70, pulse 60, temperature 99.5 F (37.5 C), temperature source Oral, resp. rate (!) 21, weight 95.2 kg, SpO2 94 %.  General - well nourished, well developed, in no apparent distress.    Ophthalmologic - fundi not visualized due to noncooperation.    Cardiovascular - regular rhythm and rate  Neuro - slightly drowsy but eyes open, global aphasia, able to say "yes/no" "OK", but perseverated on "1966 to 2001" for most of the questions. Able to follow most simple commands but not all the commands. Not able to name but able to repeat simple 3-word sentences. Left gaze not cross midline with right neglect. Not blinking to visual threat on the right. Right facial droop, right UE flaccid, right LE withdraw 2+/5 to pain. LUE and LLE 5/5. Sensation, coordination not cooperative and gait not tested.  ASSESSMENT/PLAN Aaron Weiss is a 54 y.o. male with history of HTN but not seeing doctors for years brought to ED as code stroke. presenting with confusion, speech  difficulties, and glucose 258 with no previous hx of diabetes.  He did not receive IV t-PA due to established stroke > 1/3 MCA. Neuro worsening (new R hemiparesis and facial droop) in hospital with stroke progression on imaging 11/30. Not a candidate for intervention w/ worsening.   Stroke: left MCA infarcts due to left ICA and M2 branches occlusion, source unclear, ? Left ICA dissection vs. Uncontrolled risk factors Stroke extension - left M1 proximal occlusion, likely due to stump emboli vs. Slow MCA flow in the setting of soft BP  CT Head 11/27 - Findings concerning for acute left MCA territory infarct, as detailed above. ASPECTS is 5. This includes an area of hypodensity in the posterior/distal left MCA territory that is more hypodense and may be more subacute in chronicity.   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 The left A1 ACA and proximal left MCA are opacified; however, there is poor opacification of more distal left MCA branches  CTP 11/27 - penumbra of 49 mL with 24 mL of core infarct  MRI head - Large acute to early subacute left MCA infarcts. Chronic left cerebellar infarct.   2D Echo EF 60 to 65%  CT Head Code Stroke 11/30 evolving L MCA territory infarcts, now including L frontal lobe infarct. Edema w/ 54mm midline shift. Hyperdense distal L MCA.  CTA Head & neck 11/30 proximal L cervical ICA occlusion. New proximal L M1 occlusion w/ poor opacification distal MCA branches c/w propogation of thrombosis  TCD bubble study no window  TEE Wed at 0830 to rule out cardiac source of stroke  Need to repeat CTA neck in 2-3 months for follow up of left ICA status  Consider 30 day cardiac event monitoring to rule out afib  [employer] Corona Virus 2 - negative  LDL - 122  HgbA1c - 8.7  Hypercoagulable work up - neg so far but some still pending  UDS - neg  VTE prophylaxis -Lovenox  aspirin 81 mg daily prior to  admission, now on aspirin 325 mg daily and clopidogrel 75 mg daily DAPT for 3 months and then ASA alone given large vessel occlusion.   Therapy recommendations:  CIR  Disposition:  Pending  Cerebral edema  Now with left MCA extension, pt has MLS with 2mm  Risk of developing cerebral edema with malignant left MCA syndrome  Close neuro check Q2h  If mental status changes, need to repeat CT and may consider transfer to ICU for 3% saline.  Discussed with primary team  Hypertensive urgency  Home BP meds: none   Was on amlodipine 10 and lisinopril 20, now off . Initially high BP then BP soft 120-140s overnight 11/29-11/30 . On IVF . Albumin 4 doses  . Long-term BP goal 130-150 given left ICA occlusion  Hyperlipidemia  Home Lipid lowering medication: none   LDL 122, goal < 70  Current lipid lowering medication: Lipitor 80 mg daily   Continue statin at discharge  Diabetes, new diagnosis  Home diabetic meds: none   Current diabetic meds: SSI   HgbA1c 8.7, goal < 7.0  CBG monitoring  Close PCP follow up  DM education for new DM  Dysphagia . Secondary to stroke progression . Cleared for D1 puree w/ pudding thick liquids . On IVF . Speech on board   Hyponatremia   Na 135->132->129   likely SIADH, but DDx including SWS   Management per primary team  Other Stroke Risk Factors  ETOH - social drinker  Obesity   Previous strokes by imaging - left cerebellar  Other Active Problems  Code status - Full code   Partially imaged ascending aortic aneurysmal dilation up to 4.1 cm. Recommend annual imaging followup by CTA or MRA.  Leukocytosis - WBC's - 8.9->11.8->12.0->13.1  (afebrile)  Vomiting 11/28 - one episode with standing up - zofran PRN  Hospital day # 3  Patient condition worsened within the last 24 hours, has developed new right hemiplegia and right neglect, continues to have aphasia, and I have ordered CT, CTA head and neck and confirmed new MCA  infarct. I discussed with Dr. Sandre Kittyaines and pt brother Aaron Weiss over the phone. I spent  35 minutes in total face-to-face time with the patient, more than 50% of which was spent in counseling and coordination of care, reviewing test results, images and medication, and discussing the diagnosis, treatment plan and potential prognosis. This patient's care requiresreview of multiple databases, neurological assessment, discussion with family, other specialists and medical decision making of high complexity. I had long discussion with brother over the phone at bedside, updated pt current condition, treatment plan and potential prognosis, and answered all the questions. He expressed understanding and appreciation. He will relay the info to pt son.    Marvel PlanJindong Wenonah Milo, MD PhD Stroke Neurology 05/09/2020 3:47 PM    To contact Stroke Continuity provider, please  refer to [email or web address] After hours, contact General Neurology

## 2020-05-09 NOTE — Progress Notes (Signed)
Subjective:   Hospital day: 2  Overnight event: No acute event  Patient was observed to have loss of right side movement and right sided facial drooping. Overnight Rn also reported that patient did have some weakness overnight and progressively worsened. When evaluating at beside, patient is more somnolent with profound right sided weakness. Code stroke was called and STAT CT head was ordered.  When re-evaluate at bedside after he got back from CT imaging, patient appears somnolent. He still says "2021" but speech is more garbled. Right UE and LE were significantly weaker on exam.   Objective:  Vital signs in last 24 hours: Vitals:   05/08/20 1527 05/08/20 2125 05/09/20 0431 05/09/20 0507  BP: 138/85 124/75  108/73  Pulse: 71 60  61  Resp: 20 18  18   Temp: 97.9 F (36.6 C) 99.3 F (37.4 C) 98.8 F (37.1 C) 98.5 F (36.9 C)  TempSrc:  Oral Oral Oral  SpO2: 93% 95%  96%  Weight:       CBC Latest Ref Rng & Units 05/09/2020 05/08/2020 05/07/2020  WBC 4.0 - 10.5 K/uL 13.1(H) 12.0(H) 11.8(H)  Hemoglobin 13.0 - 17.0 g/dL 05/09/2020 36.6 44.0  Hematocrit 39 - 52 % 46.7 45.3 44.0  Platelets 150 - 400 K/uL 279 270 260   CMP Latest Ref Rng & Units 05/09/2020 05/08/2020 05/07/2020  Glucose 70 - 99 mg/dL 05/09/2020) 425(Z) 563(O)  BUN 6 - 20 mg/dL 756(E) 10 8  Creatinine 0.61 - 1.24 mg/dL 33(I 9.51 8.84  Sodium 135 - 145 mmol/L 129(L) 132(L) 135  Potassium 3.5 - 5.1 mmol/L 3.2(L) 3.6 3.9  Chloride 98 - 111 mmol/L 97(L) 97(L) 99  CO2 22 - 32 mmol/L 22 23 26   Calcium 8.9 - 10.3 mg/dL 9.0 9.1 8.9  Total Protein 6.5 - 8.1 g/dL - - -  Total Bilirubin 0.3 - 1.2 mg/dL - - -  Alkaline Phos 38 - 126 U/L - - -  AST 15 - 41 U/L - - -  ALT 0 - 44 U/L - - -    Physical Exam  Physical Exam Constitutional:      Comments: More somnolent  HENT:     Head: Normocephalic.  Eyes:     General:        Right eye: No discharge.        Left eye: No discharge.  Cardiovascular:     Rate and Rhythm:  Normal rate and regular rhythm.  Pulmonary:     Effort: No respiratory distress.  Musculoskeletal:     Right lower leg: No edema.     Left lower leg: No edema.  Skin:    General: Skin is warm.  Neurological:     Comments: Right-sided neglect of EOM Right facial drooping, tongue deviate to the left Right LE and UE: strength 1/5     Assessment/Plan: Aaron Weiss is a 10 yom with hypertension, hyperlipidemia, obesity, and prior CVA who was admitted to IMTS forongoing evaluation and treatment of left MCA territory infarct 2/2 left ICA occlusion  Principal Problem:   Carotid artery occlusion with cerebral infarction [facility]) Active Problems:   Cerebral embolism with cerebral infarction   Aortic aneurysm (HCC)   CVA (cerebral vascular accident) (HCC)   Diabetes mellitus, type 2 (HCC)   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 Partial global aphasia History of cerebellar CVA Patient was found to have new profound weakness of the right side.  CT head shows evolving left MCA  territory infarcts.  There is hyperdensity in the proximal/distal left MCA concerning for thrombosis and posterior left MCA concerning for petechial hemorrhage vs thrombosed cortical vessels.  CTA head and neck show occluded proximal left ICA within new occlusion of the left M1 MCA compatible with propagation of thrombus.  Plan:  Workup per neurology -Neurochecks every 2 hours -Tele monitoring.  - Continue aspirin 325 mg and Plavix 75mg  for 3 months then aspirin alone - Continue atorvastatin80 mg. Goal LDL < 70 -Allow for permissive hypertension.     - Stopped amlodipine and lisinopril - TTE was performed but did not show the window for study.  Neurology recommended TEE which is scheduled for tomorrow. -Consider 30 days cardiac monitor to rule out A. 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  Will obtain serum osmolarity, urine sodium and urine osmol to confirm SIADH   Diabetes Hgb A1C 8.7.  Fasting CBG slightly elevated 189 but prandial CBG okay - Continue 10 units of Lantus nightly - sliding scale insulin - CBG monitor   Hyperlipidemia  LDL 122 -Continue atorvastatin 80 mg. Goal LDL < 70   Hypertension -Stop amlodipine and lisinopril.  Allow for permissive hypertension    Ascending aortic aneurysmal dilation up to 4.1 cm-follow up outpatient   Diet:Heart healthy/carb modified IVF:N/A CODE:Full  Prior to Admission Living Arrangement:Home Anticipated Discharge Location:CIR Barriers to Discharge:Medical management Dispo:CIR  EXB:MWUXLKG, DO 05/09/2020, 6:04 AM Pager: [phone] After 5pm on weekdays and 1pm on weekends: On Call pager [phone]

## 2020-05-09 NOTE — Progress Notes (Signed)
Pt. BP is 125/70 temp of 99.5 oral. Pt. Seems to be sleepy and is falling asleep even with nurse in the room.  Messaged Doctor and updated.

## 2020-05-09 NOTE — Progress Notes (Signed)
Modified Barium Swallow Progress Note  Patient Details  Name: Aaron Weiss MRN: [id number] Date of Birth: 11/14/1965  Today's Date: 05/09/2020  Modified Barium Swallow completed.  Full report located under Chart Review in the Imaging Section.  Brief recommendations include the following:  Clinical Impression  Pt presents with a moderate neurogenic dysphagia impacting timing and mobility.  His pharyngeal function was marked by poor timing of laryngeal vestibule closure, incomplete laryngeal elevation, and reduced pharyngeal stripping. Pt demonstrated oral spillage of materials from right anterior side of mouth.  There were mild deficits in cohesion, but generally controlled propulsion into the pharynx.  Honey thick liquids penetrated the larynx and were aspirated to just below the vocal folds, eliciting a delayed but eventual cough response.  Purees penetrated the entrance of the vestibule in trace amounts and inconsistently.  Notable was intermittent backflow from the cervical esophagus into the pyriform space.  Recommend maintaining a dysphagia 1 diet, downgrade liquids to pudding consistency, and crush meds.  Provide full supervision with meals. SLP will follow closely to ensure safety and adequate intake.  He remains a high aspiration risk.    Swallow Evaluation Recommendations       SLP Diet Recommendations: Pudding thick liquid;Dysphagia 1 (Puree) solids   Liquid Administration via: Spoon   Medication Administration: Crushed with puree   Supervision: Full assist for feeding   Compensations: Slow rate;Small sips/bites;Lingual sweep for clearance of pocketing;Clear throat intermittently   Postural Changes: Remain semi-upright after after feeds/meals (Comment)   Oral Care Recommendations: Oral care BID   Other Recommendations: Order thickener from pharmacy   Yamilet Mcfayden L. Samson Frederic, MA CCC/SLP Acute Rehabilitation Services Office number [phone] Pager  [phone]  Blenda Mounts Laurice 05/09/2020,1:35 PM

## 2020-05-10 ENCOUNTER — Encounter (HOSPITAL_COMMUNITY): Payer: Self-pay | Admitting: Internal Medicine

## 2020-05-10 ENCOUNTER — Inpatient Hospital Stay (HOSPITAL_COMMUNITY): Payer: BLUE CROSS/BLUE SHIELD

## 2020-05-10 ENCOUNTER — Encounter (HOSPITAL_COMMUNITY)
Admission: EM | Disposition: A | Discharge status: Rehabilitation Facility | Payer: Self-pay | Source: Home / Self Care | Attending: Internal Medicine

## 2020-05-10 ENCOUNTER — Inpatient Hospital Stay (HOSPITAL_COMMUNITY): Payer: BLUE CROSS/BLUE SHIELD | Admitting: Certified Registered"

## 2020-05-10 DIAGNOSIS — Z794 Long term (current) use of insulin: Secondary | ICD-10-CM

## 2020-05-10 DIAGNOSIS — I63232 Cerebral infarction due to unspecified occlusion or stenosis of left carotid arteries: Secondary | ICD-10-CM | POA: Diagnosis not present

## 2020-05-10 DIAGNOSIS — E6609 Other obesity due to excess calories: Secondary | ICD-10-CM

## 2020-05-10 DIAGNOSIS — R4701 Aphasia: Secondary | ICD-10-CM

## 2020-05-10 DIAGNOSIS — E785 Hyperlipidemia, unspecified: Secondary | ICD-10-CM

## 2020-05-10 DIAGNOSIS — I10 Essential (primary) hypertension: Secondary | ICD-10-CM

## 2020-05-10 DIAGNOSIS — Z8673 Personal history of transient ischemic attack (TIA), and cerebral infarction without residual deficits: Secondary | ICD-10-CM

## 2020-05-10 DIAGNOSIS — I712 Thoracic aortic aneurysm, without rupture: Secondary | ICD-10-CM

## 2020-05-10 DIAGNOSIS — E871 Hypo-osmolality and hyponatremia: Secondary | ICD-10-CM

## 2020-05-10 DIAGNOSIS — I639 Cerebral infarction, unspecified: Secondary | ICD-10-CM

## 2020-05-10 DIAGNOSIS — I63412 Cerebral infarction due to embolism of left middle cerebral artery: Secondary | ICD-10-CM | POA: Diagnosis not present

## 2020-05-10 HISTORY — PX: TEE WITHOUT CARDIOVERSION: SHX5443

## 2020-05-10 HISTORY — PX: BUBBLE STUDY: SHX6837

## 2020-05-10 LAB — CARDIOLIPIN ANTIBODIES, IGG, IGM, IGA
Anticardiolipin IgA: <9 APL U/mL (ref 0–11)
Anticardiolipin IgG: <9 GPL U/mL (ref 0–14)
Anticardiolipin IgM: <9 MPL U/mL (ref 0–12)

## 2020-05-10 LAB — BASIC METABOLIC PANEL
Anion gap: 12 (ref 5–15)
BUN: 18 mg/dL (ref 6–20)
CO2: 23 mmol/L (ref 22–32)
Calcium: 8.9 mg/dL (ref 8.9–10.3)
Chloride: 101 mmol/L (ref 98–111)
Creatinine, Ser: 0.97 mg/dL (ref 0.61–1.24)
GFR, Estimated: >60 mL/min (ref >60)
Glucose, Bld: 163 mg/dL — ABNORMAL HIGH (ref 70–99)
Potassium: 3.4 mmol/L — ABNORMAL LOW (ref 3.5–5.1)
Sodium: 136 mmol/L (ref 135–145)

## 2020-05-10 LAB — CBC
HCT: 42.8 % (ref 39.0–52.0)
Hemoglobin: 14.6 g/dL (ref 13.0–17.0)
MCH: 30.5 pg (ref 26.0–34.0)
MCHC: 34.1 g/dL (ref 30.0–36.0)
MCV: 89.4 fL (ref 80.0–100.0)
Platelets: 240 10*3/uL (ref 150–400)
RBC: 4.79 MIL/uL (ref 4.22–5.81)
RDW: 12.6 % (ref 11.5–15.5)
WBC: 10.6 10*3/uL — ABNORMAL HIGH (ref 4.0–10.5)
nRBC: 0 % (ref 0.0–0.2)

## 2020-05-10 LAB — GLUCOSE, CAPILLARY
Glucose-Capillary: 140 mg/dL — ABNORMAL HIGH (ref 70–99)
Glucose-Capillary: 154 mg/dL — ABNORMAL HIGH (ref 70–99)
Glucose-Capillary: 155 mg/dL — ABNORMAL HIGH (ref 70–99)
Glucose-Capillary: 149 mg/dL — ABNORMAL HIGH (ref 70–99)

## 2020-05-10 SURGERY — ECHOCARDIOGRAM, TRANSESOPHAGEAL
Anesthesia: Monitor Anesthesia Care

## 2020-05-10 MED ORDER — PROPOFOL 10 MG/ML IV BOLUS
INTRAVENOUS | Status: DC | PRN
  Administered 2020-05-10: 25 mg via INTRAVENOUS
  Administered 2020-05-10: 30 mg via INTRAVENOUS

## 2020-05-10 MED ORDER — PROPOFOL 500 MG/50ML IV EMUL
INTRAVENOUS | Status: DC | PRN
  Administered 2020-05-10: 150 ug/kg/min via INTRAVENOUS

## 2020-05-10 MED ORDER — SODIUM CHLORIDE 0.9 % IV SOLN: INTRAVENOUS | Status: DC

## 2020-05-10 NOTE — Anesthesia Procedure Notes (Signed)
Procedure Name: MAC Date/Time: 05/10/2020 8:52 AM Performed by: Imagene Riches, CRNA Pre-anesthesia Checklist: Patient identified, Emergency Drugs available, Suction available and Patient being monitored Patient Re-evaluated:Patient Re-evaluated prior to induction Oxygen Delivery Method: Nasal cannula

## 2020-05-10 NOTE — Progress Notes (Signed)
Occupational Therapy Re-Eval Patient Details Name: Aaron Weiss MRN: [id number] DOB: [birth year] Today's Date: 05/10/2020    History of present illness 54 y.o. male presenting with code stroke. NIHSS 6 with global partial aphasia; CT head chronic left cerebellar infarct, subacute/chronic left superior parietal infarct, subacute left parietal infarct, acute left frontal infarct. 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 Code stroke called again on 11/30 around 8:15am due to new R sided weakness, R gaze and R neglect overnight. CT (+) new developing L MCA CVA. CTA head/neck (+) L M1 proximal occusion. PMHx significant for uncontrolled HTN.    OT comments  Upon initial evaluation, patient was requiring Min guard for bed mobility, functional transfers, and functional mobility with cues for safety awareness 2/2 impulsivity. Patient currently demonstrating new deficits in including R hemiplegia with assistance required to maintain static sitting balance, R neglect, L gaze, R visual field cut, and +2 assist for functional transfers. Patient would benefit from continued acute OT services. Continued recommendation for CIR given patient's CLOF.    Follow Up Recommendations  CIR    Equipment Recommendations  3 in 1 bedside commode    Recommendations for Other Services Rehab consult    Precautions / Restrictions Precautions Precautions: Fall;Other (comment) Precaution Comments: R visual field cut Restrictions Weight Bearing Restrictions: No       Mobility Bed Mobility Overal bed mobility: Needs Assistance Bed Mobility: Rolling;Sidelying to Sit Rolling: Mod assist Sidelying to sit: Max assist;+2 for physical assistance;+2 for safety/equipment       General bed mobility comments: Mod A for rolling to L and Max A +2 for sidelying to sitting at BLE and trunk. Unable to use RUE to assist.    Transfers Overall transfer level: Needs assistance Equipment used: 2 person hand held  assist Transfers: Sit to/from [employer] Transfers Sit to Stand: Max assist;+2 physical assistance;+2 safety/equipment Stand pivot transfers: Max assist;+2 physical assistance;+2 safety/equipment       General transfer comment: Max A +2 with R knee block and hand held assist for sit to stand from EOB and stand-pivot transfer to recliner.     Balance Overall balance assessment: Needs assistance Sitting-balance support: Single extremity supported;Feet supported Sitting balance-Leahy Scale: Poor Sitting balance - Comments: Patient required external assist to maintain sitting balance at EOB. Demonstrates L truncal sway and mild pushers syndrome.    Standing balance support: Bilateral upper extremity supported;During functional activity Standing balance-Leahy Scale: Poor Standing balance comment: Hand held assist +2 to maintain standing balance.                            ADL either performed or assessed with clinical judgement   ADL Overall ADL's : Needs assistance/impaired     Grooming: Total assistance           Upper Body Dressing : Total assistance Upper Body Dressing Details (indicate cue type and reason): To don anterior hospital gown in supine Lower Body Dressing: Total assistance Lower Body Dressing Details (indicate cue type and reason): To don footwear lying supine in bed Toilet Transfer: Maximal assistance;+2 for physical assistance;+2 for safety/equipment Toilet Transfer Details (indicate cue type and reason): Simulated with stand-pivot transfer to recliner with Max A +2 and +2 hand-held assist.         Functional mobility during ADLs: Maximal assistance;+2 for physical assistance;+2 for safety/equipment       Vision   Vision Assessment?: Yes Eye Alignment: Impaired (comment)  Ocular Range of Motion: Restricted on the right Alignment/Gaze Preference: Gaze left;Chin down;Head turned Tracking/Visual Pursuits: Impaired - to be further tested in  functional context (Difficult to assess 2/2 cognition) Saccades: Additional head turns occurred during testing;Undershoots Convergence: Impaired - to be further tested in functional context Visual Fields: Right visual field deficit;Impaired-to be further tested in functional context Additional Comments: Difficulty to assess vision 2/2 decreased cognition and inability to follow 1-step verbal commands consistently.    Perception     Praxis      Cognition Arousal/Alertness: Awake/alert Behavior During Therapy: Flat affect Overall Cognitive Status: Impaired/Different from baseline Area of Impairment: Problem solving;Safety/judgement                 Orientation Level: Disoriented to;Person;Place;Time Current Attention Level: Focused Memory: Decreased short-term memory Following Commands: Follows one step commands with increased time;Follows multi-step commands inconsistently Safety/Judgement: Decreased awareness of deficits Awareness: Intellectual Problem Solving: Slow processing;Requires verbal cues;Decreased initiation;Difficulty sequencing;Requires tactile cues General Comments: Patient inconsistently follows 1-step verbal commands. Perseverative speech repeating the word "one" throughout visual assessment. Patient able to answer questions with multi choice cues with 50-60% accuracy.         Exercises     Shoulder Instructions       General Comments VSS, pt with BM, dependent for hygiene    Pertinent Vitals/ Pain       Pain Assessment: Faces Faces Pain Scale: No hurt Pain Descriptors / Indicators: Moaning Pain Intervention(s): Monitored during session  Home Living                                          Prior Functioning/Environment              Frequency  Min 2X/week        Progress Toward Goals  OT Goals(current goals can now be found in the care plan section)  Progress towards OT goals: Goals drowngraded-see care plan  Acute  Rehab OT Goals Patient Stated Goal: Unable to state OT Goal Formulation: Patient unable to participate in goal setting Time For Goal Achievement: 05/24/20 Potential to Achieve Goals: Fair ADL Goals Pt Will Perform Grooming: with min assist;sitting Pt Will Transfer to Toilet: squat pivot transfer;with mod assist  Plan Discharge plan remains appropriate    Co-evaluation    PT/OT/SLP Co-Evaluation/Treatment: Yes Reason for Co-Treatment: Complexity of the patient's impairments (multi-system involvement) PT goals addressed during session: Mobility/safety with mobility        AM-PAC OT "6 Clicks" Daily Activity     Outcome Measure   Help from another person eating meals?: Total Help from another person taking care of personal grooming?: A Lot Help from another person toileting, which includes using toliet, bedpan, or urinal?: Total Help from another person bathing (including washing, rinsing, drying)?: Total Help from another person to put on and taking off regular upper body clothing?: A Lot Help from another person to put on and taking off regular lower body clothing?: Total 6 Click Score: 8    End of Session Equipment Utilized During Treatment: Gait belt  OT Visit Diagnosis: Unsteadiness on feet (R26.81);Other abnormalities of gait and mobility (R26.89);Cognitive communication deficit (R41.841);Other symptoms and signs involving cognitive function Symptoms and signs involving cognitive functions: Cerebral infarction   Activity Tolerance Patient tolerated treatment well   Patient Left in chair;with call bell/phone within reach;with chair alarm set   Nurse  Communication Mobility status        Time: 1110-1135 OT Time Calculation (min): 25 min  Charges: OT General Charges $OT Visit: 1 Visit OT Evaluation $OT Re-eval: 1 Re-eval OT Treatments $Therapeutic Activity: 8-22 mins  Madlyn Crosby H. OTR/L Supplemental OT, Department of rehab services [phone]   Clarabel [name] R  H. 05/10/2020, 2:42 PM

## 2020-05-10 NOTE — Progress Notes (Signed)
°  Echocardiogram Echocardiogram Transesophageal has been performed.  Aaron Weiss 05/10/2020, 9:35 AM

## 2020-05-10 NOTE — CV Procedure (Signed)
   Transesophageal Echocardiogram  Indications: Stroke  Time out performed  Propofol, anesthesia present  Findings:  Left Ventricle: Normal EF  Mitral Valve: Normal no MR  Aortic Valve: Normal valve, trace AI  Tricuspid Valve: Normal, no TR  Left Atrium: Normal, no LAA thrombus  Bubble Contrast Study: Negative. No shunt  Donato Schultz, MD

## 2020-05-10 NOTE — Progress Notes (Signed)
  Speech Language Pathology Treatment: Dysphagia;Cognitive-Linquistic  Patient Details Name: Aaron Weiss MRN: [id number] DOB: 05-24-1966 Today's Date: 05/10/2020 Time: 1140-1200 SLP Time Calculation (min) (ACUTE ONLY): 20 min  Assessment / Plan / Recommendation Clinical Impression  Pt seen for language and aphasia treatment while in chair, awake but slightly drowsy. He initially followed verbal and visual cues for utensil placement on right side of oral cavity. There was no pocketing with applesauce and pudding thick juice. He was unable to execute tongue movement to right to check for residue. No cough, throat clear or wet vocal quality during snack. He appropriately responded "no" to question but most were "yes with 30% accuracy. Given phrase completion and phonemic cue he named objects with 1/5 accurate. Unable to read/verbally identify brother's name. He was appropriately labile after hearing therapist update staff re: stroke extension yesterday. Pt would benefit from intense ST on inpatient rehab setting.    HPI HPI: 54 y.o. Caucasian male with PMH of HTN but not seeing doctors for years presented to ED 05/06/20 as code stroke. NIHSS 6 with global partial aphasia; CT head chronic left cerebellar infarct, subacute/chronic left superior parietal infarct, subacute left parietal infarct, acute left frontal infarct. 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 Pt acutely developed right side paresis, facial droop and CT revealed extension of stroke.      SLP Plan  Continue with current plan of care       Recommendations  Diet recommendations: Dysphagia 1 (puree);Pudding-thick liquid Medication Administration: Crushed with puree Supervision: Patient able to self feed;Full supervision/cueing for compensatory strategies Compensations: Slow rate;Small sips/bites;Lingual sweep for clearance of pocketing Postural Changes and/or Swallow Maneuvers: Seated upright 90 degrees                 General recommendations: Rehab consult Oral Care Recommendations: Oral care BID Follow up Recommendations: Inpatient Rehab SLP Visit Diagnosis: Dysphagia, oropharyngeal phase (R13.12);Aphasia (R47.01) Plan: Continue with current plan of care                       Royce Macadamia 05/10/2020, 2:10 PM  Breck Coons Lonell Face.Ed Nurse, children's [phone] Office [phone]

## 2020-05-10 NOTE — Progress Notes (Addendum)
TC LM with [facility] and [place], [phone].  Potential CIR program in [place]. Daleen Squibb, MSW, LCSW 12/1/202112:09 PM   1240: Phone call back from [place], admissions at [facility]: fax referral to [phone].  Fax sent. Daleen Squibb, MSW, LCSW 12/1/20211:31 PM

## 2020-05-10 NOTE — Progress Notes (Signed)
Inpatient Rehabilitation-Admissions Coordinator   Received insurance approval for CIR. However, noted pt had new CVA and still undergoing workup. AC will follow for medical readiness.   Also, pt's son prefers CIR in [place] if possible. TOC team working on that request. They will notify me if the requested place in [place] is unable to accept.   Cheri Rous, OTR/L  Rehab Admissions Coordinator  [phone] 05/10/2020 3:13 PM

## 2020-05-10 NOTE — Plan of Care (Signed)
  Problem: Education: Goal: Knowledge of General Education information will improve Description Including pain rating scale, medication(s)/side effects and non-pharmacologic comfort measures Outcome: Progressing   

## 2020-05-10 NOTE — Interval H&P Note (Signed)
History and Physical Interval Note:  05/10/2020 8:39 AM  Aaron Weiss  has presented today for surgery, with the diagnosis of STROKE.  The various methods of treatment have been discussed with the patient and family. After consideration of risks, benefits and other options for treatment, the patient has consented to  Procedure(s): TRANSESOPHAGEAL ECHOCARDIOGRAM (TEE) (N/A) as a surgical intervention.  The patient's history has been reviewed, patient examined, no change in status, stable for surgery.  I have reviewed the patient's chart and labs.  Questions were answered to the patient's satisfaction.     [employer]

## 2020-05-10 NOTE — Transfer of Care (Signed)
Immediate Anesthesia Transfer of Care Note  Patient: Aaron Weiss  Procedure(s) Performed: TRANSESOPHAGEAL ECHOCARDIOGRAM (TEE) (N/A ) BUBBLE STUDY  Patient Location: Endoscopy Unit  Anesthesia Type:MAC  Level of Consciousness: drowsy  Airway & Oxygen Therapy: Patient Spontanous Breathing and Patient connected to nasal cannula oxygen  Post-op Assessment: Report given to RN and Post -op Vital signs reviewed and stable  Post vital signs: Reviewed and stable  Last Vitals:  Vitals Value Taken Time  BP 104/65 05/10/20 0911  Temp    Pulse 77 05/10/20 0911  Resp 25 05/10/20 0911  SpO2 97 % 05/10/20 0911  Vitals shown include unvalidated device data.  Last Pain:  Vitals:   05/10/20 0735  TempSrc: Temporal  PainSc:          Complications: No complications documented.

## 2020-05-10 NOTE — Anesthesia Postprocedure Evaluation (Signed)
Anesthesia Post Note  Patient: Aaron Weiss  Procedure(s) Performed: TRANSESOPHAGEAL ECHOCARDIOGRAM (TEE) (N/A ) BUBBLE STUDY     Patient location during evaluation: Endoscopy Anesthesia Type: MAC Level of consciousness: awake and alert Pain management: pain level controlled Vital Signs Assessment: post-procedure vital signs reviewed and stable Respiratory status: spontaneous breathing, nonlabored ventilation, respiratory function stable and patient connected to nasal cannula oxygen Cardiovascular status: stable and blood pressure returned to baseline Postop Assessment: no apparent nausea or vomiting Anesthetic complications: no   No complications documented.  Last Vitals:  Vitals:   05/10/20 0930 05/10/20 1156  BP: (!) 145/79   Pulse: 75   Resp: (!) 21   Temp:  37.1 C  SpO2: 95%     Last Pain:  Vitals:   05/10/20 1000  TempSrc:   PainSc: 0-No pain                 Belenda Cruise P Shawnna Pancake

## 2020-05-10 NOTE — Progress Notes (Signed)
HD#4 Subjective:  Overnight Events: None  Follows commands. Saying more words. Continues to have right-sided neglect. Facial droop improving. No headache, dizziness, or chest pain.    Objective:  Vital signs in last 24 hours: Vitals:   05/09/20 1922 05/09/20 2150 05/09/20 2315 05/10/20 0337  BP: (!) 153/83 (!) 143/78 (!) 141/116 (!) 155/92  Pulse: 73 (!) 55 70 65  Resp: 20 (!) 21 20 (!) 21  Temp: 99.4 F (37.4 C) 99.1 F (37.3 C) 99.2 F (37.3 C) 99 F (37.2 C)  TempSrc: Oral Axillary Axillary Axillary  SpO2: 95% 94% 96% 96%  Weight:       Supplemental O2: Room Air SpO2: 96 %   Physical Exam:  Physical Exam Constitutional:      Comments: Resting in bed, no acute distress  HENT:     Mouth/Throat:     Mouth: Mucous membranes are moist.  Eyes:     Conjunctiva/sclera: Conjunctivae normal.  Cardiovascular:     Rate and Rhythm: Normal rate and regular rhythm.  Pulmonary:     Effort: Pulmonary effort is normal.     Breath sounds: Normal breath sounds.  Abdominal:     General: Abdomen is flat. Bowel sounds are normal.  Neurological:     Mental Status: He is alert.     Comments: Speaking more clearly this morning, able to answer yes and now questions, fladcid on the RUE and RLE, right facial droop improving. Follows commands     Filed Weights   05/06/20 1300 05/08/20 0935  Weight: 96.7 kg 95.2 kg     Intake/Output Summary (Last 24 hours) at 05/10/2020 0511 Last data filed at 05/09/2020 1500 Gross per 24 hour  Intake 326.49 ml  Output --  Net 326.49 ml   Net IO Since Admission: -965.18 mL [05/10/20 0511]  Pertinent Labs: CBC Latest Ref Rng & Units 05/10/2020 05/09/2020 05/08/2020  WBC 4.0 - 10.5 K/uL 10.6(H) 13.1(H) 12.0(H)  Hemoglobin 13.0 - 17.0 g/dL 16.1 09.6 04.5  Hematocrit 39 - 52 % 42.8 46.7 45.3  Platelets 150 - 400 K/uL 240 279 270    CMP Latest Ref Rng & Units 05/10/2020 05/09/2020 05/08/2020  Glucose 70 - 99 mg/dL 409(W) 119(J) 478(G)  BUN  6 - 20 mg/dL 18 95(A) 10  Creatinine 0.61 - 1.24 mg/dL 2.13 0.86 5.78  Sodium 135 - 145 mmol/L 136 129(L) 132(L)  Potassium 3.5 - 5.1 mmol/L 3.4(L) 3.2(L) 3.6  Chloride 98 - 111 mmol/L 101 97(L) 97(L)  CO2 22 - 32 mmol/L 23 22 23   Calcium 8.9 - 10.3 mg/dL 8.9 9.0 9.1  Total Protein 6.5 - 8.1 g/dL - - -  Total Bilirubin 0.3 - 1.2 mg/dL - - -  Alkaline Phos 38 - 126 U/L - - -  AST 15 - 41 U/L - - -  ALT 0 - 44 U/L - - -    Imaging: CT ANGIO HEAD W OR WO CONTRAST  Result Date: 05/09/2020 CLINICAL DATA:  Stroke/TIA.  Assess intracranial arteries. EXAM: CT ANGIOGRAPHY HEAD AND NECK TECHNIQUE: Multidetector CT imaging of the head and neck was performed using the standard protocol during bolus administration of intravenous contrast. Multiplanar CT image reconstructions and MIPs were obtained to evaluate the vascular anatomy. Carotid stenosis measurements (when applicable) are obtained utilizing NASCET criteria, using the distal internal carotid diameter as the denominator. CONTRAST:  19mL OMNIPAQUE IOHEXOL 350 MG/ML SOLN COMPARISON:  Prior CT and MRI exams from May 06, 2020. FINDINGS: CTA NECK FINDINGS  Aortic arch: Great vessel origins are patent. Please see prior CTA for characterization of ascending aortic aneurysm, not imaged on this study. Right carotid system: No evidence of dissection, stenosis (50% or greater) or occlusion. Left carotid system: Similar abrupt occlusion of the proximal cervical internal carotid artery with non opacification of the internal carotid artery in the remainder of the neck. Vertebral arteries: Right dominant. No evidence of dissection, stenosis (50% or greater) or occlusion. Skeleton: No acute fracture. Other neck: No mass or suspicious adenopathy. Upper chest: No acute findings. Review of the MIP images confirms the above findings CTA HEAD FINDINGS Anterior circulation: The left petrous and cavernous ICA is not opacified, similar to prior. In comparison to prior,  there is new occlusion of the proximal left M1 MCA. Minimal opacification of the paraclinoid left ICA is likely secondary to retrograde flow from the left A1 ACA. There is poor opacification of more distal left MCA branches. The right ICA and right MCA and ACA branches are patent without evidence of hemodynamically significant stenosis. Posterior circulation: Right fetal type PCA. No evidence of hemodynamically significant proximal stenosis or large vessel occlusion in the posterior circulation. Venous sinuses: As permitted by contrast timing, patent. 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 New occlusion of the left M1 MCA proximally with poor opacification of distal left MCA branches, compatible with propogation of thrombus. Critical Value/emergent results were called by telephone at the time of interpretation on 05/09/2020 at 8:50 am to provider [facility] , who verbally acknowledged these results. Electronically Signed   By: Feliberto Harts MD   On: 05/09/2020 09:12   CT ANGIO NECK W OR WO CONTRAST  Result Date: 05/09/2020 CLINICAL DATA:  Stroke/TIA.  Assess intracranial arteries. EXAM: CT ANGIOGRAPHY HEAD AND NECK TECHNIQUE: Multidetector CT imaging of the head and neck was performed using the standard protocol during bolus administration of intravenous contrast. Multiplanar CT image reconstructions and MIPs were obtained to evaluate the vascular anatomy. Carotid stenosis measurements (when applicable) are obtained utilizing NASCET criteria, using the distal internal carotid diameter as the denominator. CONTRAST:  75mL OMNIPAQUE IOHEXOL 350 MG/ML SOLN COMPARISON:  Prior CT and MRI exams from May 06, 2020. FINDINGS: CTA NECK FINDINGS Aortic arch: Great vessel origins are patent. Please see prior CTA for characterization of ascending aortic aneurysm, not imaged on this study. Right carotid system: No evidence of dissection, stenosis (50% or greater) or occlusion. Left  carotid system: Similar abrupt occlusion of the proximal cervical internal carotid artery with non opacification of the internal carotid artery in the remainder of the neck. Vertebral arteries: Right dominant. No evidence of dissection, stenosis (50% or greater) or occlusion. Skeleton: No acute fracture. Other neck: No mass or suspicious adenopathy. Upper chest: No acute findings. Review of the MIP images confirms the above findings CTA HEAD FINDINGS Anterior circulation: The left petrous and cavernous ICA is not opacified, similar to prior. In comparison to prior, there is new occlusion of the proximal left M1 MCA. Minimal opacification of the paraclinoid left ICA is likely secondary to retrograde flow from the left A1 ACA. There is poor opacification of more distal left MCA branches. The right ICA and right MCA and ACA branches are patent without evidence of hemodynamically significant stenosis. Posterior circulation: Right fetal type PCA. No evidence of hemodynamically significant proximal stenosis or large vessel occlusion in the posterior circulation. Venous sinuses: As permitted by contrast timing, patent. 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 New occlusion of  the left M1 MCA proximally with poor opacification of distal left MCA branches, compatible with propogation of thrombus. Critical Value/emergent results were called by telephone at the time of interpretation on 05/09/2020 at 8:50 am to provider [facility] , who verbally acknowledged these results. Electronically Signed   By: Feliberto Harts MD   On: 05/09/2020 09:12   DG Swallowing Func-Speech Pathology  Result Date: 05/09/2020 Objective Swallowing Evaluation: Type of Study: MBS-Modified Barium Swallow Study  Patient Details Name: Aaron Weiss MRN: [id number] Date of Birth: 03-11-1966 Today's Date: 05/09/2020 Time: SLP Start Time (ACUTE ONLY): 1255 -SLP Stop Time (ACUTE ONLY): 1316 SLP Time Calculation (min)  (ACUTE ONLY): 21 min Past Medical History: No past medical history on file. Past Surgical History: No past surgical history on file. HPI: 54 y.o. Caucasian male with PMH of HTN but not seeing doctors for years presented to ED 05/06/20 as code stroke. NIHSS 6 with global partial aphasia; CT head chronic left cerebellar infarct, subacute/chronic left superior parietal infarct, subacute left parietal infarct, acute left frontal infarct. 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 Pt acutely developed right side paresis, facial droop and CT revealed extension of stroke.  Subjective: alert Assessment / Plan / Recommendation CHL IP CLINICAL IMPRESSIONS 05/09/2020 Clinical Impression Pt presents with a moderate neurogenic dysphagia impacting timing and mobility.  His pharyngeal function was marked by poor timing of laryngeal vestibule closure, incomplete laryngeal elevation, and reduced pharyngeal stripping. Pt demonstrated oral spillage of materials from right anterior side of mouth.  There were mild deficits in cohesion, but generally controlled propulsion into the pharynx.  Honey thick liquids penetrated the larynx and were aspirated to just below the vocal folds, eliciting a delayed but eventual cough response.  Purees penetrated the entrance of the vestibule in trace amounts and inconsistently.  Notable was intermittent backflow from the cervical esophagus into the pyriform space.  Recommend maintaining a dysphagia 1 diet, downgrade liquids to pudding consistency, and crush meds.  Provide full supervision with meals. SLP will follow closely to ensure safety and adequate intake.  He remains a high aspiration risk.  SLP Visit Diagnosis Dysphagia, oropharyngeal phase (R13.12) Attention and concentration deficit following -- Frontal lobe and executive function deficit following -- Impact on safety and function Moderate aspiration risk   CHL IP TREATMENT RECOMMENDATION 05/09/2020 Treatment Recommendations Therapy as  outlined in treatment plan below   Prognosis 05/09/2020 Prognosis for Safe Diet Advancement Good Barriers to Reach Goals Severity of deficits Barriers/Prognosis Comment -- CHL IP DIET RECOMMENDATION 05/09/2020 SLP Diet Recommendations Pudding thick liquid;Dysphagia 1 (Puree) solids Liquid Administration via Spoon Medication Administration Crushed with puree Compensations Slow rate;Small sips/bites;Lingual sweep for clearance of pocketing;Clear throat intermittently Postural Changes Remain semi-upright after after feeds/meals (Comment)   CHL IP OTHER RECOMMENDATIONS 05/09/2020 Recommended Consults -- Oral Care Recommendations Oral care BID Other Recommendations Order thickener from pharmacy   CHL IP FOLLOW UP RECOMMENDATIONS 05/09/2020 Follow up Recommendations Inpatient Rehab   CHL IP FREQUENCY AND DURATION 05/09/2020 Speech Therapy Frequency (ACUTE ONLY) min 2x/week Treatment Duration 2 weeks      CHL IP ORAL PHASE 05/09/2020 Oral Phase Impaired Oral - Pudding Teaspoon -- Oral - Pudding Cup -- Oral - Honey Teaspoon Right anterior bolus loss;Decreased bolus cohesion Oral - Honey Cup -- Oral - Nectar Teaspoon -- Oral - Nectar Cup -- Oral - Nectar Straw -- Oral - Thin Teaspoon -- Oral - Thin Cup -- Oral - Thin Straw -- Oral - Puree Right anterior bolus  loss Oral - Mech Soft -- Oral - Regular -- Oral - Multi-Consistency -- Oral - Pill -- Oral Phase - Comment --  CHL IP PHARYNGEAL PHASE 05/09/2020 Pharyngeal Phase Impaired Pharyngeal- Pudding Teaspoon -- Pharyngeal -- Pharyngeal- Pudding Cup -- Pharyngeal -- Pharyngeal- Honey Teaspoon Delayed swallow initiation-vallecula;Delayed swallow initiation-pyriform sinuses;Reduced pharyngeal peristalsis;Reduced laryngeal elevation;Reduced airway/laryngeal closure;Reduced tongue base retraction;Penetration/Aspiration before swallow;Penetration/Aspiration during swallow;Pharyngeal residue - valleculae;Trace aspiration Pharyngeal Material enters airway, passes BELOW cords and not  ejected out despite cough attempt by patient Pharyngeal- Honey Cup -- Pharyngeal -- Pharyngeal- Nectar Teaspoon -- Pharyngeal -- Pharyngeal- Nectar Cup -- Pharyngeal -- Pharyngeal- Nectar Straw -- Pharyngeal -- Pharyngeal- Thin Teaspoon -- Pharyngeal -- Pharyngeal- Thin Cup -- Pharyngeal -- Pharyngeal- Thin Straw -- Pharyngeal -- Pharyngeal- Puree Delayed swallow initiation-vallecula;Delayed swallow initiation-pyriform sinuses;Reduced pharyngeal peristalsis;Reduced laryngeal elevation;Reduced airway/laryngeal closure;Reduced tongue base retraction;Penetration/Aspiration before swallow;Penetration/Aspiration during swallow;Pharyngeal residue - valleculae Pharyngeal Material enters airway, remains ABOVE vocal cords and not ejected out Pharyngeal- Mechanical Soft -- Pharyngeal -- Pharyngeal- Regular -- Pharyngeal -- Pharyngeal- Multi-consistency -- Pharyngeal -- Pharyngeal- Pill -- Pharyngeal -- Pharyngeal Comment --  CHL IP CERVICAL ESOPHAGEAL PHASE 05/09/2020 Cervical Esophageal Phase (No Data) Pudding Teaspoon -- Pudding Cup -- Honey Teaspoon -- Honey Cup -- Nectar Teaspoon -- Nectar Cup -- Nectar Straw -- Thin Teaspoon -- Thin Cup -- Thin Straw -- Puree -- Mechanical Soft -- Regular -- Multi-consistency -- Pill -- Cervical Esophageal Comment -- Blenda MountsCouture, Amanda Laurice 05/09/2020, 1:36 PM              CT HEAD CODE STROKE WO CONTRAST`  Result Date: 05/09/2020 CLINICAL DATA:  Code stroke. Neuro deficit, acute stroke suspected. Right-sided weakness. EXAM: CT HEAD WITHOUT CONTRAST TECHNIQUE: Contiguous axial images were obtained from the base of the skull through the vertex without intravenous contrast. COMPARISON:  CT and MRI May 06, 2020 FINDINGS: Brain: Evolving left MCA territory infarcts. Additionally, there is loss of gray-white differentiation in the left frontal lobe in an area that was previously not restricting diffusion. Hyperdensity within the more posterior left MCA territory infarct (for example  series 5, image 49 and series 3, image 18). Progressive edema and mass effect with approximately 2 mm of rightward midline shift. Basal cisterns are patent. No hydrocephalus. No mass lesion. Remote left cerebellar infarct. Vascular: Hyperdense M1 MCA. Additionally, hyperdensity of more distal MCA branches. Skull: No acute fracture. Sinuses/Orbits: Scattered paranasal sinus mucosal thickening Other: No mastoid effusions. IMPRESSION: 1. Evolving left MCA territory infarcts. Additionally, there is loss of gray-white differentiation in the left frontal lobe in an area that was previously not restricting diffusion, concerning for expansion of infarct. MRI could further evaluate if clinically indicated. 2. Progression of associated edema and mass effect with approximately 2 mm of rightward midline shift. 3. Hyperdense proximal and distal left MCA, concerning for thrombosis. See forthcoming CTA for further evaluation. 4. Hyperdensity within the more posterior left MCA territory infarct, which could represent petechial hemorrhage versus thrombosed cortical vessels. Code stroke imaging results were communicated on 05/09/2020 at 8:43 am to provider Dr. Roda ShuttersXu Via telephone, who verbally acknowledged these results. Electronically Signed   By: Feliberto HartsFrederick S Jones MD   On: 05/09/2020 08:49    Assessment/Plan:   Principal Problem:   Carotid artery occlusion with cerebral infarction [facility](HCC) Active Problems:   Cerebral embolism with cerebral infarction   Aortic aneurysm (HCC)   CVA (cerebral vascular accident) (HCC)   Diabetes mellitus, type 2 (HCC)   Patient Summary: Aaron MorganSandy Alan Perdueis a 54 yom  with hypertension, hyperlipidemia, obesity, and prior CVA who was admitted to IMTS forongoing evaluation and treatment of left MCA territory infarct 2/2 left ICA occlusion  Principal Problem:   Carotid artery occlusion with cerebral infarction [facility]) Active Problems:   Cerebral embolism with cerebral infarction   Aortic  aneurysm (HCC)   CVA (cerebral vascular accident) (HCC)   Diabetes mellitus, type 2 (HCC)   Evolving left MCA territory infarct secondary to left ICA occlusion. Partial global aphasia History of cerebellar CVA Patient continues to have weakness on the right side, however aphrasia is improving able to say more works and following commands was found to have new profound weakness of the right side. Plan:  Workup per neurology - TEE today to R/o cardiac source - hypercoagulable studies negative so far -Neurochecks every 2 hours -Tele monitoring. - Continue aspirin 325 mg and Plavix 75mg for 3 months then aspirin alone -Continueatorvastatin80 mg. Goal LDL <70 -Allow permissive hypertension, hold amlodipine and lisinopril  -Consider 30 dayscardiac monitorto rule out A. 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  Pending serum osmolarity, urine sodium and urine osmol to confirm SIADH - BMP in AM  Diabetes Hgb A1C 8.7. -Continue 10 units of Lantus nightly - sliding scale insulin - CBG monitor  Hyperlipidemia  LDL 122 -Continue atorvastatin80 mg. Goal LDL < 70  Hypertension BP in 150s over 90s -Stop amlodipine and lisinopril.  Allow for permissive hypertension   Ascending aortic aneurysmal dilation up to 4.1 cm-follow up outpatient  Diet:Heart healthy/carb modified dysphagia 1 diet IVF:N/A CODE:Full  Prior to Admission Living Arrangement:Home Anticipated Discharge Location:CIR Barriers to Discharge:Medical management Dispo:CIR  ZHG:DJMEQAS, MD 05/10/2020, 5:11 AM Pager: [phone]  Please contact the on call pager after 5 pm and on weekends at [phone].

## 2020-05-10 NOTE — Progress Notes (Signed)
Physical Therapy RE-EVALUATION Patient Details Name: Aaron Weiss MRN: [id number] DOB: [birth year] Today's Date: 05/10/2020    History of Present Illness 54 y.o. male presenting with code stroke. NIHSS 6 with global partial aphasia; CT head chronic left cerebellar infarct, subacute/chronic left superior parietal infarct, subacute left parietal infarct, acute left frontal infarct. 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 Code stroke called again on 11/30 around 8:15am due to new R sided weakness, R gaze and R neglect overnight. CT (+) new developing L MCA CVA. CTA head/neck (+) L M1 proximal occusion. PMHx significant for uncontrolled HTN.     PT Comments    Pt now with R hemiparesis, R sided neglect, vision deficits, impaired cognition, and is requiring maxA for OOB mobility as a result from new L MCA CVA. Pt to strongly benefit from CIR upon d/c for maximal functional recovery as pt was ambulating PTA. Acute PT to cont to follow.  Follow Up Recommendations  CIR     Equipment Recommendations  None recommended by PT    Recommendations for Other Services Rehab consult     Precautions / Restrictions Precautions Precautions: Fall;Other (comment) Precaution Comments: R visual fields deficits, garbled speech Restrictions Weight Bearing Restrictions: No    Mobility  Bed Mobility Overal bed mobility: Needs Assistance Bed Mobility: Rolling;Sidelying to Sit Rolling: Mod assist Sidelying to sit: Max assist;+2 for physical assistance;+2 for safety/equipment       General bed mobility comments: rolled to L, pt unable to use R UE to assist  Transfers Overall transfer level: Needs assistance Equipment used: 2 person hand held assist (2 person lift with gait belt and bed pad) Transfers: Sit to/from Stand;Stand Pivot Transfers Sit to Stand: Max assist;+2 physical assistance;+2 safety/equipment Stand pivot transfers: Max assist;+2 physical assistance;+2 safety/equipment        General transfer comment: maxAx2 with R knee block, pt able to advance L LE with short shuffled step and blocking of R knee, max directional verbal cues, instructed RN staff to use the stedy  Ambulation/Gait                 Stairs             Wheelchair Mobility    Modified Rankin (Stroke Patients Only) Modified Rankin (Stroke Patients Only) Pre-Morbid Rankin Score: No symptoms Modified Rankin: Severe disability     Balance Overall balance assessment: Needs assistance Sitting-balance support: Single extremity supported;Feet supported Sitting balance-Leahy Scale: Poor Sitting balance - Comments: Patient required external assist to maintain sitting balance at EOB. Demonstrates L truncal sway and mild pushers syndrome.    Standing balance support: Bilateral upper extremity supported;During functional activity Standing balance-Leahy Scale: Poor Standing balance comment: Hand held assist +2 to maintain standing balance.                             Cognition Arousal/Alertness: Awake/alert Behavior During Therapy: Flat affect Overall Cognitive Status: Impaired/Different from baseline Area of Impairment: Orientation;Attention;Following commands;Safety/judgement;Problem solving;Awareness;Memory                 Orientation Level: Disoriented to;Place;Time;Situation Current Attention Level: Focused Memory: Decreased short-term memory Following Commands: Follows one step commands with increased time;Follows multi-step commands with increased time;Follows multi-step commands inconsistently Safety/Judgement: Decreased awareness of safety;Decreased awareness of deficits Awareness: Intellectual (pt attempting to turn and reach for buttocks after BM) Problem Solving: Slow processing;Decreased initiation;Difficulty sequencing;Requires verbal cues;Requires tactile cues General Comments: pt answering yes/no  questions appropriately 50% of the time. Pt able to pick  the right answer when given 2 words approx 50% of time ie. is your birthday july or [place], he stated july. R sided neglect/L gaze preference      Exercises      General Comments General comments (skin integrity, edema, etc.): VSS, pt with BM, dependent for hygiene      Pertinent Vitals/Pain Pain Assessment: Faces Faces Pain Scale: No hurt Pain Descriptors / Indicators: Moaning Pain Intervention(s): Monitored during session    Home Living                      Prior Function            PT Goals (current goals can now be found in the care plan section) Acute Rehab PT Goals Patient Stated Goal: Unable to state Progress towards PT goals: Goals downgraded-see care plan    Frequency    Min 4X/week      PT Plan Current plan remains appropriate    Co-evaluation PT/OT/SLP Co-Evaluation/Treatment: Yes Reason for Co-Treatment: Complexity of the patient's impairments (multi-system involvement) PT goals addressed during session: Mobility/safety with mobility        AM-PAC PT "6 Clicks" Mobility   Outcome Measure  Help needed turning from your back to your side while in a flat bed without using bedrails?: A Lot Help needed moving from lying on your back to sitting on the side of a flat bed without using bedrails?: A Lot Help needed moving to and from a bed to a chair (including a wheelchair)?: A Lot Help needed standing up from a chair using your arms (e.g., wheelchair or bedside chair)?: A Lot Help needed to walk in hospital room?: Total Help needed climbing 3-5 steps with a railing? : Total 6 Click Score: 10    End of Session Equipment Utilized During Treatment: Gait belt Activity Tolerance: Patient tolerated treatment well Patient left: in chair;with call bell/phone within reach;with chair alarm set Nurse Communication: Mobility status PT Visit Diagnosis: Unsteadiness on feet (R26.81);Difficulty in walking, not elsewhere classified (R26.2);Other symptoms  and signs involving the nervous system (O13.086)     Time: 5784-6962 PT Time Calculation (min) (ACUTE ONLY): 27 min  Charges:                        Aaron Weiss, PT, DPT Acute Rehabilitation Services Pager #: [phone] Office #: [phone]    Aaron Weiss 05/10/2020, 1:05 PM

## 2020-05-10 NOTE — Progress Notes (Signed)
STROKE TEAM PROGRESS NOTE   INTERVAL HISTORY Patient back from TEE, reclining in chair, mildly lethargic, easy arousable and follow some simple commands.  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  TEE unremarkable.  OBJECTIVE Vitals:   05/10/20 0920 05/10/20 0930 05/10/20 1000 05/10/20 1156  BP: 140/71 (!) 145/79    Pulse: 74 75    Resp: (!) 23 (!) 21    Temp:    98.7 F (37.1 C)  TempSrc:      SpO2: 96% 95%    Weight:   90 kg    CBC:  Recent Labs  Lab 05/06/20 1308 05/06/20 1309 05/09/20 0420 05/10/20 0052  WBC 8.9   < > 13.1* 10.6*  NEUTROABS 7.6  --   --   --   HGB 15.7   < > 16.2 14.6  HCT 45.8   < > 46.7 42.8  MCV 89.6   < > 88.6 89.4  PLT 260   < > 279 240   < > = values in this interval not displayed.   Basic Metabolic Panel:  Recent Labs  Lab 05/09/20 0420 05/10/20 0052  NA 129* 136  K 3.2* 3.4*  CL 97* 101  CO2 22 23  GLUCOSE 189* 163*  BUN 22* 18  CREATININE 1.13 0.97  CALCIUM 9.0 8.9    Lipid Panel:     Component Value Date/Time   CHOL 183 05/07/2020 0347   TRIG 111 05/07/2020 0347   HDL 39 (L) 05/07/2020 0347   CHOLHDL 4.7 05/07/2020 0347   VLDL 22 05/07/2020 0347   LDLCALC 122 (H) 05/07/2020 0347   HgbA1c:  Lab Results  Component Value Date   HGBA1C 8.7 (H) 05/07/2020   Urine Drug Screen:     Component Value Date/Time   LABOPIA NONE DETECTED 05/07/2020 1850   COCAINSCRNUR NONE DETECTED 05/07/2020 1850   LABBENZ NONE DETECTED 05/07/2020 1850   AMPHETMU NONE DETECTED 05/07/2020 1850   THCU NONE DETECTED 05/07/2020 1850   LABBARB NONE DETECTED 05/07/2020 1850    Alcohol Level No results found for: [facility]  IMAGING  CT HEAD CODE STROKE WO CONTRAST 05/06/2020 1. Findings concerning for acute left MCA territory infarct, as detailed above.ASPECTS is 5.  2. This includes an area of hypodensity in the posterior/distal left MCA territory that is more hypodense and may be more subacute in chronicity.  3. No acute hemorrhage.   4. Remote left cerebellar infarct.   CT ANGIO HEAD W OR WO CONTRAST CT ANGIO NECK W OR WO CONTRAST CT CEREBRAL PERFUSION W CONTRAST 05/06/2020 1. 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 The left A1 ACA and proximal left MCA are opacified; however, there is poor opacification of more distal left MCA branches.  2. RAPID perfusion calculates a left MCA territory penumbra of 49 mL with 24 mL of core infarct, as detailed above.  3. Partially imaged ascending aortic aneurysmal dilation up to 4.1 cm. Recommend annual imaging followup by CTA or MRA.  This recommendation follows 2010 ACCF/AHA/AATS/ACR/ASA/SCA/SCAI/SIR/STS/SVM Guidelines for the Diagnosis and Management of Patients with Thoracic Aortic Disease. Circulation. 2010; 121: U981-X914. Aortic aneurysm NOS (ICD10-I71.9)   MR BRAIN WO CONTRAST 05/06/2020 1. Large acute to early subacute left MCA infarct.  2. Chronic left cerebellar infarct.   Transthoracic Echocardiogram  1. Left ventricular ejection fraction, by estimation, is 60 to 65%. The left ventricle has normal function. The left ventricle has no regional wall motion abnormalities. There is mild  left ventricular hypertrophy. Left ventricular diastolic parameters are consistent with Grade I diastolic dysfunction (impaired relaxation).  2. Right ventricular systolic function is normal. The right ventricular size is normal.  3. The mitral valve is grossly normal. No evidence of mitral valve regurgitation.  4. The aortic valve is tricuspid. Aortic valve regurgitation is not visualized.  5. The inferior vena cava is normal in size with greater than 50% respiratory variability, suggesting right atrial pressure of 3 mmHg.   TEE 05/10/2020  1. Left ventricular ejection fraction, by estimation, is 55 to 60%. The left ventricle has normal function.  2. Right ventricular systolic function is normal. The right ventricular size is normal.  3. No  left atrial/left atrial appendage thrombus was detected.  4. The mitral valve is normal in structure. Trivial mitral valve regurgitation.  5. The aortic valve is normal in structure. Aortic valve regurgitation is trivial.  6. Agitated saline contrast bubble study was negative, with no evidence of any interatrial shunt. Conclusion(s)/Recommendation(s): No LA/LAA thrombus identified. Negative bubble study for interatrial shunt. No intracardiac source of embolism detected on this on this transesophageal echocardiogram.   ECG - SR rate 78 BPM. (See cardiology reading for complete details)  PHYSICAL EXAM  Blood pressure (!) 145/79, pulse 75, temperature 98.7 F (37.1 C), resp. rate (!) 21, weight 90 kg, SpO2 95 %.  General - well nourished, well developed, in no apparent distress.    Ophthalmologic - fundi not visualized due to noncooperation.    Cardiovascular - regular rhythm and rate  Neuro - slightly drowsy but eyes open easily, global aphasia, able to say "yes/no" "OK", but perseverated on "1966 to 2001" for most of the questions. Able to follow most simple commands but not all the commands. Not able to name but able to repeat simple 3-word sentences. Left gaze not cross midline with right neglect. Not blinking to visual threat on the right. Right facial droop, right UE flaccid, right LE withdraw 2+/5 to pain. LUE and LLE 5/5. Sensation, coordination not cooperative and gait not tested.  ASSESSMENT/PLAN [name] with history of HTN but not seeing doctors for years brought to ED as code stroke. presenting with confusion, speech difficulties, and glucose 258 with no previous hx of diabetes.  He did not receive IV t-PA due to established stroke > 1/3 MCA. Neuro worsening (new R hemiparesis and facial droop) in hospital with stroke progression on imaging 11/30. Not a candidate for intervention w/ worsening.   Stroke: left MCA infarcts due to left ICA and M2 branches  occlusion, source unclear, ? Left ICA dissection vs. Uncontrolled risk factors Stroke extension - left M1 proximal occlusion, likely due to stump emboli vs. Slow MCA flow in the setting of soft BP  CT Head 11/27 - Findings concerning for acute left MCA territory infarct, as detailed above. ASPECTS is 5. This includes an area of hypodensity in the posterior/distal left MCA territory that is more hypodense and may be more subacute in chronicity.   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 The left A1 ACA and proximal left MCA are opacified; however, there is poor opacification of more distal left MCA branches  CTP 11/27 - penumbra of 49 mL with 24 mL of core infarct  MRI head - Large acute to early subacute left MCA infarcts. Chronic left cerebellar infarct.   2D Echo EF 60 to 65%  CT Head  Code Stroke 11/30 evolving L MCA territory infarcts, now including L frontal lobe infarct. Edema w/ 62mm midline shift. Hyperdense distal L MCA.   CTA Head & neck 11/30 proximal L cervical ICA occlusion. New proximal L M1 occlusion w/ poor opacification distal MCA branches c/w propogation of thrombosis  TCD bubble study no window  TEE normal, no cardiac source of stroke  Need to repeat CTA neck in 2-3 months for follow up of left ICA status  Consider 30 day cardiac event monitoring to rule out afib  [employer] Virus 2 - negative  LDL - 122  HgbA1c - 8.7  Hypercoagulable work up - neg  UDS - neg  VTE prophylaxis - Lovenox 40 mg sq daily   aspirin 81 mg daily prior to admission, now on aspirin 325 mg daily and clopidogrel 75 mg daily DAPT for 3 months and then ASA alone given large vessel occlusion.   Therapy recommendations:  CIR  Disposition:  Pending  Cerebral edema  Now with left MCA extension, pt has MLS with 37mm  Risk of developing cerebral edema with malignant left MCA syndrome  Close neuro check Q2h->q4h  If mental  status changes, need to repeat CT and may consider transfer to ICU for 3% saline.  Discussed with primary team  Hypertensive urgency  Home BP meds: none   Was on amlodipine 10 and lisinopril 20, now off . Stable today . On IVF . Albumin 4 doses 11/30 . Long-term BP goal 130-150 given left ICA occlusion  Hyperlipidemia  Home Lipid lowering medication: none   LDL 122, goal < 70  Current lipid lowering medication: Lipitor 80 mg daily   Continue statin at discharge  Diabetes, new diagnosis  Home diabetic meds: none   Current diabetic meds: SSI   HgbA1c 8.7, goal < 7.0  CBG monitoring  Close PCP follow up  DM education for new DM  Dysphagia . Secondary to stroke progression . Cleared for D1 puree w/ pudding thick liquids . On IVF . Speech on board   Hyponatremia   Na 135->132->129->136   likely SIADH, but DDx including SWS   Management per primary team  Other Stroke Risk Factors  ETOH - social drinker  Obesity   Previous strokes by imaging - left cerebellar  Other Active Problems  Code status - Full code   Partially imaged ascending aortic aneurysmal dilation up to 4.1 cm. Recommend annual imaging followup by CTA or MRA.  Leukocytosis - WBC's - 8.9->11.8->12.0->13.1->10.6  (afebrile)  Vomiting 11/28 - one episode with standing up - zofran PRN  Hypokalemia 3.4 - supplement  Hospital day # 4   Marvel Plan, MD PhD Stroke Neurology 05/10/2020 2:22 PM    To contact Stroke Continuity provider, please refer to [email or web address]. After hours, contact General Neurology

## 2020-05-10 NOTE — Anesthesia Preprocedure Evaluation (Addendum)
Anesthesia Evaluation  Patient identified by MRN, date of birth, ID band Patient confused    Reviewed: Patient's Chart, lab work & pertinent test results  Airway Mallampati: II  TM Distance: >3 FB Neck ROM: Full    Dental  (+) Teeth Intact   Pulmonary neg pulmonary ROS,    Pulmonary exam normal        Cardiovascular negative cardio ROS   Rhythm:Regular Rate:Normal     Neuro/Psych CVA negative psych ROS   GI/Hepatic negative GI ROS, Neg liver ROS,   Endo/Other  diabetes, Well Controlled, Type 2  Renal/GU negative Renal ROS  negative genitourinary   Musculoskeletal negative musculoskeletal ROS (+)   Abdominal (+)  Abdomen: soft. Bowel sounds: normal.  Peds  Hematology negative hematology ROS (+)   Anesthesia Other Findings   Reproductive/Obstetrics negative OB ROS                            Anesthesia Physical Anesthesia Plan  ASA: III  Anesthesia Plan: MAC   Post-op Pain Management:    Induction:   PONV Risk Score and Plan: 1 and Ondansetron and Propofol infusion  Airway Management Planned: Simple Face Mask, Natural Airway and Nasal Cannula  Additional Equipment: None  Intra-op Plan:   Post-operative Plan:   Informed Consent: I have reviewed the patients History and Physical, chart, labs and discussed the procedure including the risks, benefits and alternatives for the proposed anesthesia with the patient or authorized representative who has indicated his/her understanding and acceptance.     Dental advisory given  Plan Discussed with: CRNA  Anesthesia Plan Comments: (Lab Results      Component                Value               Date                      WBC                      10.6 (H)            05/10/2020                HGB                      14.6                05/10/2020                HCT                      42.8                05/10/2020                 MCV                      89.4                05/10/2020                PLT                      240                 05/10/2020          )  Anesthesia Quick Evaluation  

## 2020-05-11 ENCOUNTER — Encounter (HOSPITAL_COMMUNITY): Payer: Self-pay | Admitting: Cardiology

## 2020-05-11 ENCOUNTER — Other Ambulatory Visit: Payer: Self-pay | Admitting: Cardiology

## 2020-05-11 DIAGNOSIS — I63232 Cerebral infarction due to unspecified occlusion or stenosis of left carotid arteries: Secondary | ICD-10-CM | POA: Diagnosis not present

## 2020-05-11 DIAGNOSIS — I63412 Cerebral infarction due to embolism of left middle cerebral artery: Secondary | ICD-10-CM | POA: Diagnosis not present

## 2020-05-11 DIAGNOSIS — I639 Cerebral infarction, unspecified: Secondary | ICD-10-CM

## 2020-05-11 DIAGNOSIS — R4182 Altered mental status, unspecified: Secondary | ICD-10-CM

## 2020-05-11 LAB — BASIC METABOLIC PANEL
Anion gap: 14 (ref 5–15)
BUN: 18 mg/dL (ref 6–20)
CO2: 21 mmol/L — ABNORMAL LOW (ref 22–32)
Calcium: 9.4 mg/dL (ref 8.9–10.3)
Chloride: 103 mmol/L (ref 98–111)
Creatinine, Ser: 0.93 mg/dL (ref 0.61–1.24)
GFR, Estimated: >60 mL/min (ref >60)
Glucose, Bld: 160 mg/dL — ABNORMAL HIGH (ref 70–99)
Potassium: 3.2 mmol/L — ABNORMAL LOW (ref 3.5–5.1)
Sodium: 138 mmol/L (ref 135–145)

## 2020-05-11 LAB — CBC
HCT: 43 % (ref 39.0–52.0)
Hemoglobin: 15.3 g/dL (ref 13.0–17.0)
MCH: 31.4 pg (ref 26.0–34.0)
MCHC: 35.6 g/dL (ref 30.0–36.0)
MCV: 88.3 fL (ref 80.0–100.0)
Platelets: 249 10*3/uL (ref 150–400)
RBC: 4.87 MIL/uL (ref 4.22–5.81)
RDW: 12.9 % (ref 11.5–15.5)
WBC: 13.5 10*3/uL — ABNORMAL HIGH (ref 4.0–10.5)
nRBC: 0 % (ref 0.0–0.2)

## 2020-05-11 LAB — GLUCOSE, CAPILLARY
Glucose-Capillary: 144 mg/dL — ABNORMAL HIGH (ref 70–99)
Glucose-Capillary: 151 mg/dL — ABNORMAL HIGH (ref 70–99)
Glucose-Capillary: 139 mg/dL — ABNORMAL HIGH (ref 70–99)
Glucose-Capillary: 148 mg/dL — ABNORMAL HIGH (ref 70–99)

## 2020-05-11 MED ORDER — LISINOPRIL 20 MG PO TABS
20.0000 mg | ORAL_TABLET | Freq: Every day | ORAL | Status: DC
  Administered 2020-05-11 – 2020-05-15 (×5): 20 mg via ORAL
  Filled 2020-05-11 (×5): qty 1

## 2020-05-11 MED ORDER — POTASSIUM CHLORIDE 20 MEQ PO PACK
40.0000 meq | PACK | Freq: Two times a day (BID) | ORAL | Status: DC
  Administered 2020-05-11 – 2020-05-13 (×6): 40 meq via ORAL
  Filled 2020-05-11 (×6): qty 2

## 2020-05-11 NOTE — Progress Notes (Signed)
Physical Therapy Treatment Patient Details Name: Aaron Weiss MRN: [id number] DOB: Jun 24, 1965 Today's Date: 05/11/2020    History of Present Illness 54 y.o. male presenting with code stroke. NIHSS 6 with global partial aphasia; CT head chronic left cerebellar infarct, subacute/chronic left superior parietal infarct, subacute left parietal infarct, acute left frontal infarct. 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 Code stroke called again on 11/30 around 8:15am due to new R sided weakness, R gaze and R neglect overnight. CT (+) new developing L MCA CVA. CTA head/neck (+) L M1 proximal occusion. PMHx significant for uncontrolled HTN.     PT Comments    +2 max assist for rolling in bed for cleanup of incontinence of bowel. +2 max assist for supine to sit. Pt sat at edge of bed for ~10 minutes with mod A for sitting balance. Pt follows 1 step commands inconsistently, he did not respond to command to perform forward reaching with LUE in sitting. He answers most questions with, "1966, 2001". BP sitting 166/114, so did not attempt transfer.    Follow Up Recommendations  CIR     Equipment Recommendations  None recommended by PT    Recommendations for Other Services Rehab consult     Precautions / Restrictions Precautions Precautions: Fall;Other (comment) Precaution Comments: R visual fields deficits, garbled speech, R hemiparesis Restrictions Weight Bearing Restrictions: No    Mobility  Bed Mobility Overal bed mobility: Needs Assistance Bed Mobility: Rolling;Sidelying to Sit Rolling: Max assist;+2 for physical assistance Sidelying to sit: Max assist;+2 for physical assistance;+2 for safety/equipment   Sit to supine: +2 for physical assistance;Total assist   General bed mobility comments: pt attempts to assist with bed mobiltiy with LUE/LLE but has poor motor planning so efforts are ineffective. BP sitting 166/114 so did not attempt transfer, BP supine 150/86 supine prior to  PT session  Transfers                    Ambulation/Gait                 Stairs             Wheelchair Mobility    Modified Rankin (Stroke Patients Only)       Balance Overall balance assessment: Needs assistance Sitting-balance support: Single extremity supported;Feet supported Sitting balance-Leahy Scale: Poor Sitting balance - Comments: Patient required mod to max assist to maintain sitting balance at EOB. Able to actively rotate head to R and gaze to R across midline. Attempted forward reaching with LUE in sitting, pt did not respond to command to reach.                                    Cognition Arousal/Alertness: Awake/alert Behavior During Therapy: Flat affect Overall Cognitive Status: Impaired/Different from baseline Area of Impairment: Problem solving;Safety/judgement                 Orientation Level: Disoriented to;Person;Place;Time Current Attention Level: Focused Memory: Decreased short-term memory Following Commands: Follows one step commands with increased time;Follows multi-step commands inconsistently Safety/Judgement: Decreased awareness of deficits Awareness: Intellectual Problem Solving: Slow processing;Requires verbal cues;Decreased initiation;Difficulty sequencing;Requires tactile cues General Comments: Patient inconsistently follows 1-step verbal commands. Perseverative speech repeating the word "one" throughout visual assessment. Patient able to answer questions with multi choice cues with 50-60% accuracy.       Exercises General Exercises - Lower Extremity Ankle Circles/Pumps:  PROM;Right;10 reps;Supine    General Comments        Pertinent Vitals/Pain Pain Assessment: No/denies pain    Home Living                      Prior Function            PT Goals (current goals can now be found in the care plan section) Acute Rehab PT Goals Patient Stated Goal: Unable to state PT Goal  Formulation: Patient unable to participate in goal setting Time For Goal Achievement: 05/28/20 Potential to Achieve Goals: Good Additional Goals Additional Goal #1: Complete appropriate standardized balance assessment (?DGI) with scoring above fall risk threshold. Progress towards PT goals: Progressing toward goals    Frequency    Min 4X/week      PT Plan Current plan remains appropriate    Co-evaluation PT/OT/SLP Co-Evaluation/Treatment: Yes Reason for Co-Treatment: Complexity of the patient's impairments (multi-system involvement);Necessary to address cognition/behavior during functional activity;For patient/therapist safety;To address functional/ADL transfers PT goals addressed during session: Mobility/safety with mobility;Balance        AM-PAC PT "6 Clicks" Mobility   Outcome Measure  Help needed turning from your back to your side while in a flat bed without using bedrails?: A Lot Help needed moving from lying on your back to sitting on the side of a flat bed without using bedrails?: A Lot Help needed moving to and from a bed to a chair (including a wheelchair)?: A Lot Help needed standing up from a chair using your arms (e.g., wheelchair or bedside chair)?: A Lot Help needed to walk in hospital room?: Total Help needed climbing 3-5 steps with a railing? : Total 6 Click Score: 10    End of Session   Activity Tolerance: Treatment limited secondary to medical complications (Comment) (elevated BP) Patient left: with call bell/phone within reach;in bed;with bed alarm set Nurse Communication: Mobility status PT Visit Diagnosis: Unsteadiness on feet (R26.81);Difficulty in walking, not elsewhere classified (R26.2);Other symptoms and signs involving the nervous system (R29.898)     Time: 5003-7048 PT Time Calculation (min) (ACUTE ONLY): 29 min  Charges:  $Therapeutic Activity: 8-22 mins                     Ralene Bathe Kistler PT 05/11/2020  Acute Rehabilitation  Services Pager [phone] Office [phone]

## 2020-05-11 NOTE — Progress Notes (Signed)
Inpatient Rehab Admissions Coordinator:   I do not have a CIR bed for patient today. Awaiting hearing back from rehab center in [place] (son's preference), but if they do not offer a bed, [facility] team will pursue for CIR admission.  Megan Salon, MS, CCC-SLP Rehab Admissions Coordinator  [phone] (celll) [phone] (office)

## 2020-05-11 NOTE — Progress Notes (Signed)
STROKE TEAM PROGRESS NOTE   INTERVAL HISTORY No family at the bedside.  Patient lying in bed, neuro largely unchanged.  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  Aphasia and left gaze seems some improvement.  No evidence of brain herniation.  OBJECTIVE Vitals:   05/11/20 0346 05/11/20 0726 05/11/20 1125 05/11/20 1143  BP: (!) 167/82 (!) 150/86 (!) 166/114 (!) 145/77  Pulse: (!) 58 (!) 56  62  Resp: (!) 21 19  18   Temp: 99 F (37.2 C) 98.2 F (36.8 C)  98.1 F (36.7 C)  TempSrc: Axillary Oral  Oral  SpO2: 93% 98%  94%  Weight:       CBC:  Recent Labs  Lab 05/06/20 1308 05/06/20 1309 05/10/20 0052 05/11/20 0413  WBC 8.9   < > 10.6* 13.5*  NEUTROABS 7.6  --   --   --   HGB 15.7   < > 14.6 15.3  HCT 45.8   < > 42.8 43.0  MCV 89.6   < > 89.4 88.3  PLT 260   < > 240 249   < > = values in this interval not displayed.   Basic Metabolic Panel:  Recent Labs  Lab 05/10/20 0052 05/11/20 0413  NA 136 138  K 3.4* 3.2*  CL 101 103  CO2 23 21*  GLUCOSE 163* 160*  BUN 18 18  CREATININE 0.97 0.93  CALCIUM 8.9 9.4    Lipid Panel:     Component Value Date/Time   CHOL 183 05/07/2020 0347   TRIG 111 05/07/2020 0347   HDL 39 (L) 05/07/2020 0347   CHOLHDL 4.7 05/07/2020 0347   VLDL 22 05/07/2020 0347   LDLCALC 122 (H) 05/07/2020 0347   HgbA1c:  Lab Results  Component Value Date   HGBA1C 8.7 (H) 05/07/2020   Urine Drug Screen:     Component Value Date/Time   LABOPIA NONE DETECTED 05/07/2020 1850   COCAINSCRNUR NONE DETECTED 05/07/2020 1850   LABBENZ NONE DETECTED 05/07/2020 1850   AMPHETMU NONE DETECTED 05/07/2020 1850   THCU NONE DETECTED 05/07/2020 1850   LABBARB NONE DETECTED 05/07/2020 1850    Alcohol Level No results found for: [facility]  IMAGING  CT HEAD CODE STROKE WO CONTRAST 05/06/2020 1. Findings concerning for acute left MCA territory infarct, as detailed above.ASPECTS is 5.  2. This includes an area of hypodensity in the posterior/distal left MCA territory that is more  hypodense and may be more subacute in chronicity.  3. No acute hemorrhage.  4. Remote left cerebellar infarct.   CT ANGIO HEAD W OR WO CONTRAST CT ANGIO NECK W OR WO CONTRAST CT CEREBRAL PERFUSION W CONTRAST 05/06/2020 1. 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 The left A1 ACA and proximal left MCA are opacified; however, there is poor opacification of more distal left MCA branches.  2. RAPID perfusion calculates a left MCA territory penumbra of 49 mL with 24 mL of core infarct, as detailed above.  3. Partially imaged ascending aortic aneurysmal dilation up to 4.1 cm. Recommend annual imaging followup by CTA or MRA.  This recommendation follows 2010 ACCF/AHA/AATS/ACR/ASA/SCA/SCAI/SIR/STS/SVM Guidelines for the Diagnosis and Management of Patients with Thoracic Aortic Disease. Circulation. 2010; 1212011. Aortic aneurysm NOS (ICD10-I71.9)   MR BRAIN WO CONTRAST 05/06/2020 1. Large acute to early subacute left MCA infarct.  2. Chronic left cerebellar infarct.   Transthoracic Echocardiogram  1. Left ventricular ejection fraction, by estimation, is 60 to 65%. The left ventricle has normal  function. The left ventricle has no regional wall motion abnormalities. There is mild left ventricular hypertrophy. Left ventricular diastolic parameters are consistent with Grade I diastolic dysfunction (impaired relaxation).  2. Right ventricular systolic function is normal. The right ventricular size is normal.  3. The mitral valve is grossly normal. No evidence of mitral valve regurgitation.  4. The aortic valve is tricuspid. Aortic valve regurgitation is not visualized.  5. The inferior vena cava is normal in size with greater than 50% respiratory variability, suggesting right atrial pressure of 3 mmHg.   TEE 05/10/2020  1. Left ventricular ejection fraction, by estimation, is 55 to 60%. The left ventricle has normal function.  2. Right  ventricular systolic function is normal. The right ventricular size is normal.  3. No left atrial/left atrial appendage thrombus was detected.  4. The mitral valve is normal in structure. Trivial mitral valve regurgitation.  5. The aortic valve is normal in structure. Aortic valve regurgitation is trivial.  6. Agitated saline contrast bubble study was negative, with no evidence of any interatrial shunt. Conclusion(s)/Recommendation(s): No LA/LAA thrombus identified. Negative bubble study for interatrial shunt. No intracardiac source of embolism detected on this on this transesophageal echocardiogram.   ECG - SR rate 78 BPM. (See cardiology reading for complete details)  PHYSICAL EXAM Blood pressure (!) 145/77, pulse 62, temperature 98.1 F (36.7 C), temperature source Oral, resp. rate 18, weight 90 kg, SpO2 94 %.  General - well nourished, well developed, in no apparent distress.    Ophthalmologic - fundi not visualized due to noncooperation.    Cardiovascular - regular rhythm and rate  Neuro - slightly drowsy but eyes open easily, global aphasia, able to say "yes/no" "all right", but perseverated on "1966 to 2001" and "apple juice" for most of the questions. Able to follow most simple commands but not all the commands. Not able to name but able to repeat simple 5-word sentences now. Left gaze not able to cross midline with right gaze incomplete and still has right neglect. Not blinking to visual threat on the right. Right facial droop, right UE flaccid, right LE withdraw 2+/5 to pain. LUE and LLE 5/5. Sensation, coordination not cooperative and gait not tested.  ASSESSMENT/PLAN Mr. Legend Tumminello is a 54 y.o. male with history of HTN but not seeing doctors for years brought to ED as code stroke. presenting with confusion, speech difficulties, and glucose 258 with no previous hx of diabetes.  He did not receive IV t-PA due to established stroke > 1/3 MCA. Neuro worsening (new R hemiparesis and  facial droop) in hospital with stroke progression on imaging 11/30. Not a candidate for intervention w/ worsening.   Stroke: left MCA infarcts due to left ICA and M2 branches occlusion, source unclear, ? Left ICA dissection vs. Uncontrolled risk factors Stroke extension - left M1 proximal occlusion, likely due to stump emboli vs. Slow MCA flow in the setting of soft BP  CT Head 11/27 - Findings concerning for acute left MCA territory infarct, as detailed above. ASPECTS is 5. This includes an area of hypodensity in the posterior/distal left MCA territory that is more hypodense and may be more subacute in chronicity.   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 The left A1 ACA and proximal left MCA are opacified; however, there is poor opacification of more distal left MCA branches  CTP 11/27 - penumbra of 49 mL with 24 mL of core infarct  MRI head - Large acute to early subacute left MCA infarcts. Chronic left cerebellar infarct.   2D Echo EF 60 to 65%  CT Head Code Stroke 11/30 evolving L MCA territory infarcts, now including L frontal lobe infarct. Edema w/ 22mm midline shift. Hyperdense distal L MCA.   CTA Head & neck 11/30 proximal L cervical ICA occlusion. New proximal L M1 occlusion w/ poor opacification distal MCA branches c/w propogation of thrombosis  TCD bubble study no window  TEE normal, no cardiac source of stroke  Need to repeat CTA neck in 2-3 months for follow up of left ICA status  Consider 30 day cardiac event monitoring to rule out afib  [employer] Virus 2 - negative  LDL - 122  HgbA1c - 8.7  Hypercoagulable work up - neg  UDS - neg  VTE prophylaxis - Lovenox 40 mg sq daily   aspirin 81 mg daily prior to admission, now on aspirin 325 mg daily and clopidogrel 75 mg daily DAPT for 3 months and then ASA alone given large vessel occlusion.   Therapy recommendations:  CIR  Disposition:   Pending  Cerebral edema  Now with left MCA extension, pt has MLS with 25mm  Risk of developing cerebral edema with malignant left MCA syndrome  Close neuro check Q2h->q4h  If mental status changes, need to repeat CT and may consider transfer to ICU for 3% saline.  Discussed with primary team  Hypertensive urgency  Home BP meds: none   Was on amlodipine 10 and lisinopril 20, now off . Stable at goal . Albumin 4 doses 11/30 . Long-term BP goal 130-150 given left ICA occlusion  Hyperlipidemia  Home Lipid lowering medication: none   LDL 122, goal < 70  Current lipid lowering medication: Lipitor 80 mg daily   Continue statin at discharge  Diabetes, new diagnosis  Home diabetic meds: none   Current diabetic meds: SSI   HgbA1c 8.7, goal < 7.0  CBG monitoring  On lantus  Close PCP follow up  DM education for new DM  Dysphagia . Secondary to stroke progression . Cleared for D1 puree w/ pudding thick liquids . On IVF . Speech on board   Hyponatremia   Na 135->132->129->136->138   likely SIADH, but DDx including SWS   resolved  Management per primary team  Other Stroke Risk Factors  ETOH - social drinker  Obesity   Previous strokes by imaging - left cerebellar  Other Active Problems  Code status - Full code   Partially imaged ascending aortic aneurysmal dilation up to 4.1 cm. Recommend annual imaging followup by CTA or MRA.  Leukocytosis - WBC's - 8.9->11.8->12.0->13.1->10.6->13.5  (afebrile)  Vomiting 11/28 - one episode with standing up - zofran PRN  Hypokalemia 3.4->3.2->3.2 - supplement  Hospital day # 5  Marvel Plan, MD PhD Stroke Neurology 05/11/2020 12:18 PM  To contact Stroke Continuity provider, please refer to [email or web address]. After hours, contact General Neurology

## 2020-05-11 NOTE — Progress Notes (Addendum)
CSW spoke with Leotis Shames, admissions Interior and spatial designer at [facility].  She is reviewing referral, needs to speak with pt family about discharge plans.  Will call CSW back. Daleen Squibb, MSW, LCSW 12/2/20212:36 PM   [facility] called back, can accept pt tomorrow. Cell: [phone] Daleen Squibb, MSW, LCSW 12/2/20212:57 PM   1545: CSW informed by Megan Salon that this facility is SNF, not CIR. Daleen Squibb, MSW, LCSW 12/3/20218:27 AM

## 2020-05-11 NOTE — Progress Notes (Signed)
Occupational Therapy Treatment Patient Details Name: Aaron Weiss MRN: [id number] DOB: 28-Aug-1965 Today's Date: 05/11/2020    History of present illness 54 y.o. male presenting with code stroke. NIHSS 6 with global partial aphasia; CT head chronic left cerebellar infarct, subacute/chronic left superior parietal infarct, subacute left parietal infarct, acute left frontal infarct. 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 Code stroke called again on 11/30 around 8:15am due to new R sided weakness, R gaze and R neglect overnight. CT (+) new developing L MCA CVA. CTA head/neck (+) L M1 proximal occusion. PMHx significant for uncontrolled HTN.    OT comments  Pt today is max A +2 to total A +2 for bed mobility. Pt is very interactive with therapy team but inconsistent response to questions. Answering "Yes" to 80% of questions, and repeating words when provided with "choices" for answers. R side remains flaccid, does not withdraw, or react from painful stimulus in RUE (deep nailbed pressure). While sitting EOB, Pt is able to track slightly past midline to the right with auditory stimulus. Pt total A +2 rolling for peri care to clean BM. PROM performed at all joints of RUE and propped up on pillow with hand OPEN at end of session. OT will continue to follow acutely and CIR level remains appropriate.    Follow Up Recommendations  CIR    Equipment Recommendations  3 in 1 bedside commode    Recommendations for Other Services Rehab consult    Precautions / Restrictions Precautions Precautions: Fall;Other (comment) Precaution Comments: R visual fields deficits, garbled speech, R hemiparesis Restrictions Weight Bearing Restrictions: No       Mobility Bed Mobility Overal bed mobility: Needs Assistance Bed Mobility: Rolling;Supine to Sit;Sit to Supine Rolling: Max assist;+2 for physical assistance Sidelying to sit: Max assist;+2 for physical assistance;+2 for safety/equipment Supine to  sit: Max assist;+2 for physical assistance;+2 for safety/equipment Sit to supine: Total assist;+2 for physical assistance;+2 for safety/equipment   General bed mobility comments: pt attempts to assist with bed mobiltiy with LUE/LLE but has poor motor planning so efforts are ineffective. BP sitting 166/114 so did not attempt transfer, BP supine 150/86 supine prior to Therapy session  Transfers                 General transfer comment: NT this session due to elevated BP, and need to clean BM    Balance Overall balance assessment: Needs assistance Sitting-balance support: Single extremity supported;Feet supported Sitting balance-Leahy Scale: Zero Sitting balance - Comments: Patient required mod to max assist to maintain sitting balance at EOB. Able to actively rotate head to R and gaze to R across midline. Attempted forward reaching with LUE in sitting, pt did not respond to command to reach.                                   ADL either performed or assessed with clinical judgement   ADL Overall ADL's : Needs assistance/impaired Eating/Feeding: Maximal assistance Eating/Feeding Details (indicate cue type and reason): Pt with only a small corner of breakfast consumed. needs more assist during feeding during typical day Grooming: Total assistance                       Toileting- Clothing Manipulation and Hygiene: Total assistance;Bed level Toileting - Clothing Manipulation Details (indicate cue type and reason): total A for all apsects of peri care, rolling in  bed     Functional mobility during ADLs: Maximal assistance;+2 for physical assistance;+2 for safety/equipment General ADL Comments: Pt max A +2 to total A for ADL at this time, limited by global aphasia, hemiperesis     Vision   Additional Comments: Pt is now tracking past midline to the right with auditory cues   Perception     Praxis      Cognition Arousal/Alertness: Awake/alert (easily  awakens) Behavior During Therapy: Flat affect Overall Cognitive Status: Impaired/Different from baseline Area of Impairment: Problem solving;Safety/judgement                 Orientation Level: Disoriented to;Person;Place;Time Current Attention Level: Focused Memory: Decreased short-term memory Following Commands: Follows one step commands with increased time;Follows multi-step commands inconsistently Safety/Judgement: Decreased awareness of deficits Awareness: Intellectual Problem Solving: Slow processing;Requires verbal cues;Decreased initiation;Difficulty sequencing;Requires tactile cues General Comments: Pt remains globally aphasic, replies "yes" to every question and repeats open ended question options without purpose        Exercises Exercises: Other exercises General Exercises - Lower Extremity Ankle Circles/Pumps: PROM;Right;10 reps;Supine Other Exercises Other Exercises: PROM at hand, wrist, elbow, shoulder on R side   Shoulder Instructions       General Comments      Pertinent Vitals/ Pain       Pain Assessment: Faces Faces Pain Scale: No hurt Pain Intervention(s): Monitored during session;Repositioned;Other (comment) (special attention for position of R side)  Home Living                                          Prior Functioning/Environment              Frequency  Min 2X/week        Progress Toward Goals  OT Goals(current goals can now be found in the care plan section)  Progress towards OT goals: Progressing toward goals  Acute Rehab OT Goals Patient Stated Goal: Unable to state  Plan Discharge plan remains appropriate    Co-evaluation    PT/OT/SLP Co-Evaluation/Treatment: Yes Reason for Co-Treatment: Complexity of the patient's impairments (multi-system involvement);Necessary to address cognition/behavior during functional activity;For patient/therapist safety PT goals addressed during session: Mobility/safety with  mobility;Balance;Strengthening/ROM OT goals addressed during session: ADL's and self-care;Strengthening/ROM      AM-PAC OT "6 Clicks" Daily Activity     Outcome Measure   Help from another person eating meals?: Total Help from another person taking care of personal grooming?: A Lot Help from another person toileting, which includes using toliet, bedpan, or urinal?: Total Help from another person bathing (including washing, rinsing, drying)?: Total Help from another person to put on and taking off regular upper body clothing?: A Lot Help from another person to put on and taking off regular lower body clothing?: Total 6 Click Score: 8    End of Session    OT Visit Diagnosis: Unsteadiness on feet (R26.81);Other abnormalities of gait and mobility (R26.89);Cognitive communication deficit (R41.841);Other symptoms and signs involving cognitive function Symptoms and signs involving cognitive functions: Cerebral infarction   Activity Tolerance Patient tolerated treatment well   Patient Left in bed;with call bell/phone within reach;with bed alarm set   Nurse Communication Mobility status;Other (comment) (needs more assist with meals)        Time: 5732-2025 OT Time Calculation (min): 29 min  Charges: OT General Charges $OT Visit: 1 Visit OT Treatments $Therapeutic Activity: 8-22  mins  Nyoka Cowden OTR/L Acute Rehabilitation Services Pager: [phone] Office: [phone]   Evern Bio Lakethia Coppess 05/11/2020, 1:46 PM

## 2020-05-11 NOTE — Progress Notes (Signed)
HD#5 Subjective:  Overnight Events: None  Follows commands. Saying more words. Continues to have right-sided neglect. Facial droop improving. No headache, dizziness, or chest pain.    Objective:  Vital signs in last 24 hours: Vitals:   05/10/20 1647 05/10/20 1924 05/10/20 2351 05/11/20 0346  BP:  (!) 163/90 (!) 154/91 (!) 167/82  Pulse:  100 64 (!) 58  Resp:  20 (!) 24 (!) 21  Temp: 99.2 F (37.3 C) 99 F (37.2 C) 98.9 F (37.2 C) 99 F (37.2 C)  TempSrc:  Axillary Axillary Axillary  SpO2:  95% 94% 93%  Weight:       Supplemental O2: Room Air SpO2: 93 % O2 Flow Rate (L/min): 2 L/min   Physical Exam:  Physical Exam Constitutional:      Comments: Resting in bed, no acute distress  HENT:     Mouth/Throat:     Mouth: Mucous membranes are moist.  Eyes:     Conjunctiva/sclera: Conjunctivae normal.  Cardiovascular:     Rate and Rhythm: Normal rate and regular rhythm.  Pulmonary:     Effort: Pulmonary effort is normal.     Breath sounds: Normal breath sounds.  Abdominal:     General: Abdomen is flat. Bowel sounds are normal.  Neurological:     Mental Status: He is alert.     Comments: Able to say more words today. Continue expressive aphasia with reading and naming. 1/5 strength of RUE, 0/5 of RLE     Filed Weights   05/06/20 1300 05/08/20 0935 05/10/20 1000  Weight: 96.7 kg 95.2 kg 90 kg     Intake/Output Summary (Last 24 hours) at 05/11/2020 0622 Last data filed at 05/10/2020 0905 Gross per 24 hour  Intake 300 ml  Output --  Net 300 ml   Net IO Since Admission: -1,365.18 mL [05/11/20 0622]  Pertinent Labs: CBC Latest Ref Rng & Units 05/11/2020 05/10/2020 05/09/2020  WBC 4.0 - 10.5 K/uL 13.5(H) 10.6(H) 13.1(H)  Hemoglobin 13.0 - 17.0 g/dL 15.1 76.1 60.7  Hematocrit 39 - 52 % 43.0 42.8 46.7  Platelets 150 - 400 K/uL 249 240 279    CMP Latest Ref Rng & Units 05/11/2020 05/10/2020 05/09/2020  Glucose 70 - 99 mg/dL 371(G) 626(R) 485(I)  BUN 6 - 20 mg/dL  18 18 62(V)  Creatinine 0.61 - 1.24 mg/dL 0.35 0.09 3.81  Sodium 135 - 145 mmol/L 138 136 129(L)  Potassium 3.5 - 5.1 mmol/L 3.2(L) 3.4(L) 3.2(L)  Chloride 98 - 111 mmol/L 103 101 97(L)  CO2 22 - 32 mmol/L 21(L) 23 22  Calcium 8.9 - 10.3 mg/dL 9.4 8.9 9.0  Total Protein 6.5 - 8.1 g/dL - - -  Total Bilirubin 0.3 - 1.2 mg/dL - - -  Alkaline Phos 38 - 126 U/L - - -  AST 15 - 41 U/L - - -  ALT 0 - 44 U/L - - -    Imaging: ECHO TEE  Result Date: 05/10/2020    TRANSESOPHOGEAL ECHO REPORT   Patient Name:   Aaron Weiss Date of Exam: 05/10/2020 Medical Rec #:  [id number]         Height: Accession #:    [id number]        Weight:       209.9 lb Date of Birth:  [birth year]         BSA:          2.085 m Patient Age:    54 years  BP:           145/79 mmHg Patient Gender: M                 HR:           103 bpm. Exam Location:  Inpatient Procedure: Transesophageal Echo, Color Doppler, Cardiac Doppler and Saline            Contrast Bubble Study Indications:     Stroke  History:         Patient has prior history of Echocardiogram examinations, most                  recent 05/07/2020.  Sonographer:     Aaron Weiss RDCS (AE) Referring Phys:  [id number] Aaron Weiss Diagnosing Phys: Aaron Schultz MD PROCEDURE: The transesophogeal probe was passed without difficulty through the esophogus of the patient. Sedation performed by different physician. The patient was monitored while under deep sedation. Anesthestetic sedation was provided intravenously by Anesthesiology: 302.04mg  of Propofol. The patient developed no complications during the procedure. IMPRESSIONS  1. Left ventricular ejection fraction, by estimation, is 55 to 60%. The left ventricle has normal function.  2. Right ventricular systolic function is normal. The right ventricular size is normal.  3. No left atrial/left atrial appendage thrombus was detected.  4. The mitral valve is normal in structure. Trivial mitral valve regurgitation.  5. The  aortic valve is normal in structure. Aortic valve regurgitation is trivial.  6. Agitated saline contrast bubble study was negative, with no evidence of any interatrial shunt. Conclusion(s)/Recommendation(s): No LA/LAA thrombus identified. Negative bubble study for interatrial shunt. No intracardiac source of embolism detected on this on this transesophageal echocardiogram. FINDINGS  Left Ventricle: Left ventricular ejection fraction, by estimation, is 55 to 60%. The left ventricle has normal function. The left ventricular internal cavity size was normal in size. Right Ventricle: The right ventricular size is normal. No increase in right ventricular wall thickness. Right ventricular systolic function is normal. Left Atrium: Left atrial size was normal in size. No left atrial/left atrial appendage thrombus was detected. Right Atrium: Right atrial size was normal in size. Pericardium: There is no evidence of pericardial effusion. Mitral Valve: The mitral valve is normal in structure. Trivial mitral valve regurgitation. Tricuspid Valve: The tricuspid valve is normal in structure. Tricuspid valve regurgitation is trivial. Aortic Valve: The aortic valve is normal in structure. Aortic valve regurgitation is trivial. Pulmonic Valve: The pulmonic valve was normal in structure. Pulmonic valve regurgitation is not visualized. Aorta: The aortic root is normal in size and structure. IAS/Shunts: No atrial level shunt detected by color flow Doppler. Agitated saline contrast was given intravenously to evaluate for intracardiac shunting. Agitated saline contrast bubble study was negative, with no evidence of any interatrial shunt. MD Electronically signed by Aaron Schultz MD Signature Date/Time: 05/10/2020/10:52:41 AM    Final     Assessment/Plan:   Principal Problem:   Carotid artery occlusion with cerebral infarction [facility]) Active Problems:   Cerebral embolism with cerebral infarction   Aortic aneurysm (HCC)    CVA (cerebral vascular accident) (HCC)   Diabetes mellitus, type 2 (HCC)   Altered mental status   Patient Summary: Aaron Weide Perdueis a 89 yom with hypertension, hyperlipidemia, obesity, and prior CVA who was admitted to IMTS forongoing evaluation and treatment of left MCA territory infarct 2/2 left ICA occlusion  Principal Problem:   Carotid artery occlusion with cerebral infarction [facility]) Active Problems:   Cerebral embolism  with cerebral infarction   Aortic aneurysm (HCC)   CVA (cerebral vascular accident) (HCC)   Diabetes mellitus, type 2 (HCC)  Evolving left MCA territory infarct secondary to left ICA occlusion. Partial global aphasia History of cerebellar CVA Speaking more words this morning.  Plan:  Workup per neurology -Tele monitoring. - Continue aspirin 325 mg and Plavix 75mg for 3 months then aspirin alone -Continueatorvastatin80 mg. Goal LDL <70 -Will slowly restart hypertension medications  -Contacted cardsmaster to set up 30 dayscardiac monitorto rule out A. Fib -Repeat CT neck in2-3 monthsto follow-up on leftICA -SLP recommended dysphagia 1 diet -PT/OT recommending CIR, pending CIR placement [place]   Diabetes Hgb A1C 8.7. -Continue 10 units of Lantus nightly - sliding scale insulin - CBG monitor  Hyperlipidemia  LDL 122 -Continue atorvastatin80 mg. Goal LDL < 70  Hypertension - BP in 160s will restart lisinopril 20 mg today   - Continue to hold amlodipine  Ascending aortic aneurysmal dilation up to 4.1 cm-follow up outpatient  Diet:Heart healthy/carb modified dysphagia 1 diet IVF:N/A [place] CODE:Full  Prior to Admission Living Arrangement:Home Anticipated Discharge Location:CIR Barriers to Discharge:CIR placement Dispo:CIR  FOY:DXAJOIN, MD 05/11/2020, 6:22 AM Pager: [phone]  Please contact the on call pager after 5 pm and on weekends at [phone].

## 2020-05-12 DIAGNOSIS — I63412 Cerebral infarction due to embolism of left middle cerebral artery: Secondary | ICD-10-CM | POA: Diagnosis not present

## 2020-05-12 DIAGNOSIS — I63232 Cerebral infarction due to unspecified occlusion or stenosis of left carotid arteries: Secondary | ICD-10-CM | POA: Diagnosis not present

## 2020-05-12 DIAGNOSIS — E119 Type 2 diabetes mellitus without complications: Secondary | ICD-10-CM

## 2020-05-12 LAB — GLUCOSE, CAPILLARY
Glucose-Capillary: 156 mg/dL — ABNORMAL HIGH (ref 70–99)
Glucose-Capillary: 153 mg/dL — ABNORMAL HIGH (ref 70–99)
Glucose-Capillary: 137 mg/dL — ABNORMAL HIGH (ref 70–99)
Glucose-Capillary: 155 mg/dL — ABNORMAL HIGH (ref 70–99)

## 2020-05-12 NOTE — Progress Notes (Addendum)
°  Speech Language Pathology Treatment: Dysphagia;Cognitive-Linquistic  Patient Details Name: Aaron Weiss MRN: [id number] DOB: 1965/12/11 Today's Date: 05/12/2020 Time: 3845-3646 SLP Time Calculation (min) (ACUTE ONLY): 22 min  Assessment / Plan / Recommendation Clinical Impression  Pt seen for aphasia and dysphagia and is making progress towards goals. He consumed approximately 3 bites magic cup and trial of honey thick liquids with delayed minimal throat clear -during MBS he exhibited delayed cough after aspiration. He continues with no/trace movement of right face/lips and was unable to cross midline to right side buccal cavity, therefore Dys 1 (puree) continues to be appropriate; continue pudding thick liquids.Cues given to remove residue from lips indicative of reduced sensation. Will repeat MBS next week for hopeful upgrade and initiation of liquids.  Increased spontaneous verbalizations today. He counted 1-3 upon request in context of repositioning up in bed. He identified words from field of 2 given responsive naming questions with 75% accuracy (self corrected x 1). Therapist provided auditory and visual stimuli during singing (in unison) familiar song with mild word and tune approximations. Pt followed one step commands with auditory cues only for 5/5 accuracy. He continues to need assist for yes/no accuracy of biographical questions. Pt would benefit from inpatient rehab program to maximize language, swallow independence.    HPI HPI: 54 y.o. Caucasian male with PMH of HTN but not seeing doctors for years presented to ED 05/06/20 as code stroke. NIHSS 6 with global partial aphasia; CT head chronic left cerebellar infarct, subacute/chronic left superior parietal infarct, subacute left parietal infarct, acute left frontal infarct. 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 Pt acutely developed right side paresis, facial droop and CT revealed extension of stroke.      SLP Plan  Continue  with current plan of care       Recommendations  Diet recommendations: Dysphagia 1 (puree);Pudding-thick liquid Medication Administration: Crushed with puree Supervision: Staff to assist with self feeding;Patient able to self feed;Full supervision/cueing for compensatory strategies Compensations: Slow rate;Small sips/bites;Lingual sweep for clearance of pocketing Postural Changes and/or Swallow Maneuvers: Seated upright 90 degrees                General recommendations: Rehab consult Oral Care Recommendations: Oral care BID Follow up Recommendations: Inpatient Rehab SLP Visit Diagnosis: Dysphagia, oropharyngeal phase (R13.12);Aphasia (R47.01) Plan: Continue with current plan of care                       Royce Macadamia 05/12/2020, 2:33 PM  Breck Coons Lonell Face.Ed Nurse, children's [phone] Office [phone]

## 2020-05-12 NOTE — Plan of Care (Signed)

## 2020-05-12 NOTE — Progress Notes (Addendum)
Inpatient Rehabilitation-Admissions Coordinator   Spoke with pt's son who states he still prefers an [place] program in [place] and would like to give it until Monday before accepting placement at [facility] CIR. I do not have a bed available for this patient until next week if they do decide on staying here at [facility]. TOC team updated.   Cheri Rous, OTR/L  Rehab Admissions Coordinator  [phone] 05/12/2020 1:07 PM

## 2020-05-12 NOTE — Progress Notes (Signed)
CIR referral faxed to new number for [facility]. Fax [phone].  Phone number [phone]. Daleen Squibb, MSW, LCSW 12/3/20218:26 AM

## 2020-05-12 NOTE — Progress Notes (Addendum)
   Subjective:   Patient is oriented to self. Unable to state where he is or what year is. He does not know why he is in the hospital. He was able to state his birthday. He denies any pain.   Objective:  Vital signs in last 24 hours: Vitals:   05/11/20 1626 05/11/20 1922 05/12/20 0009 05/12/20 0406  BP: 137/80 (!) 148/86 (!) 141/94 (!) 139/98  Pulse: 66 71 65 63  Resp: 18 (!) 22 (!) 25 20  Temp: 99.2 F (37.3 C) 99.3 F (37.4 C) 99.7 F (37.6 C) 98.8 F (37.1 C)  TempSrc: Oral Oral Oral Oral  SpO2: 96% 94% 95% 94%  Weight:       Physical Exam- General: NAD, laying in bed  Neuro: oriented to self, right sided facial droop, right UE 1/5 strength, right LE strength 0/5, aphasia still present however speaking more on exam today MSK: no LE edema Skin: warm and dry  Assessment/Plan:  Principal Problem:   Carotid artery occlusion with cerebral infarction [facility]) Active Problems:   Cerebral embolism with cerebral infarction   Aortic aneurysm (HCC)   CVA (cerebral vascular accident) (HCC)   Diabetes mellitus, type 2 (HCC)   Altered mental status   Aaron Kaspar Perdueis a 54 yom with hypertension, hyperlipidemia, obesity, and prior CVA who was admitted to IMTS forongoing evaluation and treatment of left MCA territory infarct 2/2 left ICA occlusion  Evolving left MCA territory infarct secondary to left ICA occlusion. Partial global aphasia History of cerebellar CVA Patient was approved for placement at [facility], however this is not an inpatient rehab facility; SW is continuing to work on placement. He is stable for discharge pending placement. Workup per neurology as below. -Tele monitoring. - Continue aspirin 325 mg and Plavix 75mg for 3 months then aspirin alone -Continueatorvastatin80 mg. Goal LDL <70 -Will slowly restart hypertension medications -Contacted cardsmaster to set up 30 dayscardiac monitorto rule out A. Fib -Repeat CT neck in2-3 monthsto  follow-up on leftICA -SLPrecommended dysphagia 1 diet -PT/OT recommending CIR, pending CIR placement in [place], [place]   Diabetes Hgb A1C 8.7. -Continue10 units of Lantus nightly - sliding scale insulin - CBG monitor  Hyperlipidemia  LDL 122 -Continue atorvastatin80 mg. Goal LDL < 70  Hypertension BP in the 140-150s. Restarted lisinopril yesterday. - Continue to hold amlodipine  Ascending aortic aneurysmal dilation up to 4.1 cm Follow up outpatient. Recommend annual imaging by CTA or MRA, per neuro.  Dispo: Anticipated discharge pending SNF placement.   [place], DO 05/12/2020, 6:08 AM Pager: [phone] After 5pm on weekdays and 1pm on weekends: On Call pager [phone]

## 2020-05-12 NOTE — Progress Notes (Signed)
STROKE TEAM PROGRESS NOTE   INTERVAL HISTORY Son at bedside.  Patient neurologically stable, still has right neglect, right hemianopia, right hemiplegia.  Discussed with son in length, updated patient condition, prognosis and treatment options.  Encourage aggressive PT/OT.  Son expressed understanding.  OBJECTIVE Vitals:   05/11/20 1922 05/12/20 0009 05/12/20 0406 05/12/20 0730  BP: (!) 148/86 (!) 141/94 (!) 139/98 (!) 154/84  Pulse: 71 65 63 (!) 58  Resp: (!) 22 (!) 25 20 (!) 23  Temp: 99.3 F (37.4 C) 99.7 F (37.6 C) 98.8 F (37.1 C) 98.7 F (37.1 C)  TempSrc: Oral Oral Oral Oral  SpO2: 94% 95% 94% 96%  Weight:       CBC:  Recent Labs  Lab 05/06/20 1308 05/06/20 1309 05/10/20 0052 05/11/20 0413  WBC 8.9   < > 10.6* 13.5*  NEUTROABS 7.6  --   --   --   HGB 15.7   < > 14.6 15.3  HCT 45.8   < > 42.8 43.0  MCV 89.6   < > 89.4 88.3  PLT 260   < > 240 249   < > = values in this interval not displayed.   Basic Metabolic Panel:  Recent Labs  Lab 05/10/20 0052 05/11/20 0413  NA 136 138  K 3.4* 3.2*  CL 101 103  CO2 23 21*  GLUCOSE 163* 160*  BUN 18 18  CREATININE 0.97 0.93  CALCIUM 8.9 9.4    Lipid Panel:     Component Value Date/Time   CHOL 183 05/07/2020 0347   TRIG 111 05/07/2020 0347   HDL 39 (L) 05/07/2020 0347   CHOLHDL 4.7 05/07/2020 0347   VLDL 22 05/07/2020 0347   LDLCALC 122 (H) 05/07/2020 0347   HgbA1c:  Lab Results  Component Value Date   HGBA1C 8.7 (H) 05/07/2020   Urine Drug Screen:     Component Value Date/Time   LABOPIA NONE DETECTED 05/07/2020 1850   COCAINSCRNUR NONE DETECTED 05/07/2020 1850   LABBENZ NONE DETECTED 05/07/2020 1850   AMPHETMU NONE DETECTED 05/07/2020 1850   THCU NONE DETECTED 05/07/2020 1850   LABBARB NONE DETECTED 05/07/2020 1850    Alcohol Level No results found for: [facility]  IMAGING  CT HEAD CODE STROKE WO CONTRAST 05/06/2020 1. Findings concerning for acute left MCA territory infarct, as detailed above.ASPECTS  is 5.  2. This includes an area of hypodensity in the posterior/distal left MCA territory that is more hypodense and may be more subacute in chronicity.  3. No acute hemorrhage.  4. Remote left cerebellar infarct.   CT ANGIO HEAD W OR WO CONTRAST CT ANGIO NECK W OR WO CONTRAST CT CEREBRAL PERFUSION W CONTRAST 05/06/2020 1. 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 The left A1 ACA and proximal left MCA are opacified; however, there is poor opacification of more distal left MCA branches.  2. RAPID perfusion calculates a left MCA territory penumbra of 49 mL with 24 mL of core infarct, as detailed above.  3. Partially imaged ascending aortic aneurysmal dilation up to 4.1 cm. Recommend annual imaging followup by CTA or MRA.  This recommendation follows 2010 ACCF/AHA/AATS/ACR/ASA/SCA/SCAI/SIR/STS/SVM Guidelines for the Diagnosis and Management of Patients with Thoracic Aortic Disease. Circulation. 2010; 121: Z601-U932. Aortic aneurysm NOS (ICD10-I71.9)   MR BRAIN WO CONTRAST 05/06/2020 1. Large acute to early subacute left MCA infarct.  2. Chronic left cerebellar infarct.   Transthoracic Echocardiogram  1. Left ventricular ejection fraction, by estimation, is  60 to 65%. The left ventricle has normal function. The left ventricle has no regional wall motion abnormalities. There is mild left ventricular hypertrophy. Left ventricular diastolic parameters are consistent with Grade I diastolic dysfunction (impaired relaxation).  2. Right ventricular systolic function is normal. The right ventricular size is normal.  3. The mitral valve is grossly normal. No evidence of mitral valve regurgitation.  4. The aortic valve is tricuspid. Aortic valve regurgitation is not visualized.  5. The inferior vena cava is normal in size with greater than 50% respiratory variability, suggesting right atrial pressure of 3 mmHg.   TEE 05/10/2020  1. Left ventricular  ejection fraction, by estimation, is 55 to 60%. The left ventricle has normal function.  2. Right ventricular systolic function is normal. The right ventricular size is normal.  3. No left atrial/left atrial appendage thrombus was detected.  4. The mitral valve is normal in structure. Trivial mitral valve regurgitation.  5. The aortic valve is normal in structure. Aortic valve regurgitation is trivial.  6. Agitated saline contrast bubble study was negative, with no evidence of any interatrial shunt. Conclusion(s)/Recommendation(s): No LA/LAA thrombus identified. Negative bubble study for interatrial shunt. No intracardiac source of embolism detected on this on this transesophageal echocardiogram.   ECG - SR rate 78 BPM. (See cardiology reading for complete details)  PHYSICAL EXAM   Blood pressure (!) 154/84, pulse (!) 58, temperature 98.7 F (37.1 C), temperature source Oral, resp. rate (!) 23, weight 90 kg, SpO2 96 %.  General - well nourished, well developed, in no apparent distress.    Ophthalmologic - fundi not visualized due to noncooperation.    Cardiovascular - regular rhythm and rate  Neuro - slightly drowsy but eyes open easily, global aphasia, able to say "yes/no" "all right", but perseverated on "1966 to 2001" and "apple juice" for most of the questions. Able to follow most simple commands but not all the commands. Not able to name but able to repeat simple 5-word sentences now. Left gaze not able to cross midline with right gaze incomplete and still has right neglect. Not blinking to visual threat on the right. Right facial droop, right UE flaccid, right LE withdraw 2+/5 to pain. LUE and LLE 5/5. Sensation, coordination not cooperative and gait not tested.  ASSESSMENT/PLAN [name] with history of HTN but not seeing doctors for years brought to ED as code stroke. presenting with confusion, speech difficulties, and glucose 258 with no previous hx of  diabetes.  He did not receive IV t-PA due to established stroke > 1/3 MCA. Neuro worsening (new R hemiparesis and facial droop) in hospital with stroke progression on imaging 11/30. Not a candidate for intervention w/ worsening.   Stroke: left MCA infarcts due to left ICA and M2 branches occlusion, source unclear, ? Left ICA dissection vs. Uncontrolled risk factors Stroke extension - left M1 proximal occlusion, likely due to stump emboli vs. Slow MCA flow in the setting of soft BP  CT Head 11/27 - Findings concerning for acute left MCA territory infarct, as detailed above. ASPECTS is 5. This includes an area of hypodensity in the posterior/distal left MCA territory that is more hypodense and may be more subacute in chronicity.   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 The left A1 ACA and proximal left MCA are opacified; however, there is poor opacification of more distal left MCA branches  CTP  11/27 - penumbra of 49 mL with 24 mL of core infarct  MRI head - Large acute to early subacute left MCA infarcts. Chronic left cerebellar infarct.   2D Echo EF 60 to 65%  CT Head Code Stroke 11/30 evolving L MCA territory infarcts, now including L frontal lobe infarct. Edema w/ 35mm midline shift. Hyperdense distal L MCA.   CTA Head & neck 11/30 proximal L cervical ICA occlusion. New proximal L M1 occlusion w/ poor opacification distal MCA branches c/w propogation of thrombosis  TCD bubble study no window  TEE normal, no cardiac source of stroke  Recommend to repeat CTA neck in 2-3 months for follow up of left ICA status  Consider 30 day cardiac event monitoring to rule out afib  [employer] Virus 2 - negative  LDL - 122  HgbA1c - 8.7  Hypercoagulable work up - neg  UDS - neg  VTE prophylaxis - Lovenox 40 mg sq daily   aspirin 81 mg daily prior to admission, now on aspirin 325 mg daily and clopidogrel 75 mg daily DAPT for 3  months and then ASA alone given large vessel occlusion.   Therapy recommendations:  CIR - son considering rehab in [place], if not available [facility] will consider admitting w/ bed availability next week  Disposition:  Pending  Cerebral edema  Now with left MCA extension, pt has MLS with 13mm  Risk of developing cerebral edema with malignant left MCA syndrome  Close neuro check Q2h->q4h  If mental status changes, need to repeat CT and may consider transfer to ICU for 3% saline.  Discussed with primary team  Hypertensive urgency  Home BP meds: none   Was on amlodipine 10 and lisinopril 20, now off . Stable at goal . Albumin 4 doses 11/30 . Long-term BP goal 130-150 given left ICA occlusion  Hyperlipidemia  Home Lipid lowering medication: none   LDL 122, goal < 70  Current lipid lowering medication: Lipitor 80 mg daily   Continue statin at discharge  Diabetes, new diagnosis  Home diabetic meds: none   Current diabetic meds: SSI   HgbA1c 8.7, goal < 7.0  CBG monitoring  On lantus  Close PCP follow up  DM education for new DM  Dysphagia . Secondary to stroke progression . Cleared for D1 puree w/ pudding thick liquids . On IVF . Speech on board   Hyponatremia   Na 135->132->129->136->138   likely SIADH, but DDx including SWS   resolved  Management per primary team  Other Stroke Risk Factors  ETOH - social drinker  Obesity   Previous strokes by imaging - left cerebellar  Other Active Problems  Code status - Full code   Partially imaged ascending aortic aneurysmal dilation up to 4.1 cm. Recommend annual imaging followup by CTA or MRA.  Leukocytosis - WBC's - 8.9->11.8->12.0->13.1->10.6->13.5  (afebrile)  Vomiting 11/28 - one episode with standing up - zofran PRN  Hypokalemia 3.4->3.2->3.2 - supplement  Hospital day # 6  Neurology will sign off. Please call with questions. Pt will follow up with neurology locally near home in about 3-4 weeks  after rehab. Thanks for the consult.  Marvel Plan, MD PhD Stroke Neurology 05/12/2020 3:18 PM  To contact Stroke Continuity provider, please refer to [email or web address]. After hours, contact General Neurology

## 2020-05-12 NOTE — Progress Notes (Addendum)
Physical Therapy Treatment Patient Details Name: Aaron Weiss MRN: [id number] DOB: 11-Dec-1965 Today's Date: 05/12/2020    History of Present Illness 54 y.o. male presenting with code stroke. NIHSS 6 with global partial aphasia; CT head chronic left cerebellar infarct, subacute/chronic left superior parietal infarct, subacute left parietal infarct, acute left frontal infarct. 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 Code stroke called again on 11/30 around 8:15am due to new R sided weakness, R gaze and R neglect overnight. CT (+) new developing L MCA CVA. CTA head/neck (+) L M1 proximal occusion. PMHx significant for uncontrolled HTN.     PT Comments    Pt with excellent participation in >45 min treatment session.  He is getting more consistent at following basic one step commands.  He continues to have difficulty with the accuracy of his "yeah".  Physically he was mod to heavy mod assist for all mobility and attempts at standing.  He would likely do very well with stedy standing frame, if this can be attempted during his next session.  PT will continue to follow acutely for safe mobility progression.  He remains highly appropriate for CIR level therapies at d/c.  Goals down graded, but only because they were not re-assessed after his extension on 05/09/20.  Follow Up Recommendations  CIR     Equipment Recommendations  Wheelchair (measurements PT);Wheelchair cushion (measurements PT);Hospital bed;Other (comment) (hoyer)    Recommendations for Other Services       Precautions / Restrictions Precautions Precautions: Fall;Other (comment) Precaution Comments: R visual fields deficits, R hemi, aphasia    Mobility  Bed Mobility Overal bed mobility: Needs Assistance Bed Mobility: Rolling;Supine to Sit Rolling: Min assist;Max assist Sidelying to sit: Mod assist;HOB elevated   Sit to supine: Mod assist   General bed mobility comments: Min assist to roll to his right due to using  his left leg and arm to push/pull over, max assist to the left. Mod assist to support trunk, help protect R UE and progress R LE to EOB.  Most significant help is at the trunk during supine or side to sit transitions.  Mod assist to lift one leg and trunk back into bed to return to supine.  We got up on both sides of the bed (had to lay down and preform peri care in the middle of the session).   Transfers Overall transfer level: Needs assistance Equipment used: None Transfers: Sit to/from Stand Sit to Stand: From elevated surface;Mod assist (HOB max elevated and pt holding the top of the bed rail. ) Stand pivot transfers: Mod assist;From elevated surface       General transfer comment: Attempted to stand x 3 with heavy mod assist PT blocking R knee, assisting at trunk and attempting hip extension.  He would likely do well with stedy standing frame next session.   Ambulation/Gait                 Stairs             Wheelchair Mobility    Modified Rankin (Stroke Patients Only) Modified Rankin (Stroke Patients Only) Pre-Morbid Rankin Score: No symptoms Modified Rankin: Severe disability     Balance Overall balance assessment: Needs assistance Sitting-balance support: Feet supported;Single extremity supported Sitting balance-Leahy Scale: Poor Sitting balance - Comments: mod to max assist EOB due to R pushing.  Practiced going down to left elbow and back up to sitting x 10, cues for upright posture, to look up   Standing balance  support: Single extremity supported Standing balance-Leahy Scale: Zero Standing balance comment: unable to obtain fully upright stand.                             Cognition Arousal/Alertness: Awake/alert Behavior During Therapy: Flat affect Overall Cognitive Status: Impaired/Different from baseline Area of Impairment: Attention;Safety/judgement;Awareness;Problem solving                   Current Attention Level:  Sustained   Following Commands: Follows one step commands inconsistently Safety/Judgement: Decreased awareness of safety;Decreased awareness of deficits Awareness: Emergent Problem Solving: Difficulty sequencing;Requires verbal cues;Requires tactile cues General Comments: apraxic, can indicate he is weak on his R but still moves very quickly and impulsively. Doing better with basic one step commands on his L      Exercises      General Comments        Pertinent Vitals/Pain Pain Assessment: No/denies pain    Home Living                      Prior Function            PT Goals (current goals can now be found in the care plan section) Progress towards PT goals: Progressing toward goals    Frequency    Min 4X/week      PT Plan Current plan remains appropriate    Co-evaluation              AM-PAC PT "6 Clicks" Mobility   Outcome Measure  Help needed turning from your back to your side while in a flat bed without using bedrails?: A Lot Help needed moving from lying on your back to sitting on the side of a flat bed without using bedrails?: A Lot Help needed moving to and from a bed to a chair (including a wheelchair)?: A Lot Help needed standing up from a chair using your arms (e.g., wheelchair or bedside chair)?: A Lot Help needed to walk in hospital room?: Total Help needed climbing 3-5 steps with a railing? : Total 6 Click Score: 10    End of Session   Activity Tolerance: Patient tolerated treatment well Patient left: in bed;with call bell/phone within reach;with bed alarm set   PT Visit Diagnosis: Unsteadiness on feet (R26.81);Difficulty in walking, not elsewhere classified (R26.2);Hemiplegia and hemiparesis Hemiplegia - Right/Left: Right Hemiplegia - dominant/non-dominant: Dominant Hemiplegia - caused by: Cerebral infarction     Time: 3716-9678 PT Time Calculation (min) (ACUTE ONLY): 37 min  Charges:  $Therapeutic Activity: 23-37  mins $Neuromuscular Re-education: 8-22 mins                    Corinna Capra, PT, DPT  Acute Rehabilitation [phone] pager [phone]) [phone] office

## 2020-05-13 ENCOUNTER — Encounter (HOSPITAL_COMMUNITY): Payer: Self-pay | Admitting: Internal Medicine

## 2020-05-13 ENCOUNTER — Other Ambulatory Visit: Payer: Self-pay

## 2020-05-13 DIAGNOSIS — E1159 Type 2 diabetes mellitus with other circulatory complications: Secondary | ICD-10-CM

## 2020-05-13 DIAGNOSIS — I639 Cerebral infarction, unspecified: Secondary | ICD-10-CM

## 2020-05-13 DIAGNOSIS — I719 Aortic aneurysm of unspecified site, without rupture: Secondary | ICD-10-CM

## 2020-05-13 LAB — GLUCOSE, CAPILLARY
Glucose-Capillary: 148 mg/dL — ABNORMAL HIGH (ref 70–99)
Glucose-Capillary: 160 mg/dL — ABNORMAL HIGH (ref 70–99)
Glucose-Capillary: 174 mg/dL — ABNORMAL HIGH (ref 70–99)
Glucose-Capillary: 164 mg/dL — ABNORMAL HIGH (ref 70–99)

## 2020-05-13 NOTE — Progress Notes (Signed)
HD#7 Subjective:  Overnight Events: No events overnight  Patient resting comfortably in bed.  Patient continues to have mild improvement with speech and vocabulary.  Patient has improved lower extremity strength.  He was previously 0 out of 5 in his right lower extremity and now has 1 out of 5 strength.  Patient's sister was at bedside with him today.  Objective:  Vital signs in last 24 hours: Vitals:   05/12/20 0406 05/12/20 0730 05/12/20 1555 05/12/20 2116  BP: (!) 139/98 (!) 154/84 (!) 144/91 (!) 134/92  Pulse: 63 (!) 58  80  Resp: 20 (!) 23  18  Temp: 98.8 F (37.1 C) 98.7 F (37.1 C) 98.3 F (36.8 C) 98 F (36.7 C)  TempSrc: Oral Oral Oral Oral  SpO2: 94% 96%  97%  Weight:       Supplemental O2: Room Air SpO2: 97 % O2 Flow Rate (L/min): 0   Physical Exam:  Physical Exam Constitutional:      Appearance: Normal appearance.  HENT:     Head: Normocephalic and atraumatic.  Eyes:     Conjunctiva/sclera: Conjunctivae normal.     Pupils: Pupils are equal, round, and reactive to light.     Comments: Patient has relatively normal extraocular movement with significant right-sided neglect limiting right sided eye movements.  Cardiovascular:     Rate and Rhythm: Normal rate.     Pulses: Normal pulses.     Heart sounds: Normal heart sounds.  Pulmonary:     Effort: Pulmonary effort is normal.     Breath sounds: Normal breath sounds.  Abdominal:     General: Bowel sounds are normal.     Palpations: Abdomen is soft.     Tenderness: There is no abdominal tenderness.  Musculoskeletal:        General: Normal range of motion.     Cervical back: Normal range of motion.     Right lower leg: No edema.     Left lower leg: No edema.  Skin:    General: Skin is warm and dry.  Neurological:     Mental Status: He is alert.     Cranial Nerves: Dysarthria present.     Motor: Weakness (RUE 0/5 and RLE 1/5 ) present.     Coordination: Coordination abnormal.     Comments:  Significant expressive aphasia with progressively increasing vocabulary. Patient will perseverate over certain words. Patient has abnormal coordination significant right-sided weakness, right-sided facial droop minimal dysarthria and right-sided neglect  Psychiatric:        Mood and Affect: Mood normal.     Filed Weights   05/06/20 1300 05/08/20 0935 05/10/20 1000  Weight: 96.7 kg 95.2 kg 90 kg     Intake/Output Summary (Last 24 hours) at 05/13/2020 0534 Last data filed at 05/13/2020 0517 Gross per 24 hour  Intake --  Output 825 ml  Net -825 ml   Net IO Since Admission: -2,740.18 mL [05/13/20 0534]  Pertinent Labs: CBC Latest Ref Rng & Units 05/11/2020 05/10/2020 05/09/2020  WBC 4.0 - 10.5 K/uL 13.5(H) 10.6(H) 13.1(H)  Hemoglobin 13.0 - 17.0 g/dL 42.6 83.4 19.6  Hematocrit 39 - 52 % 43.0 42.8 46.7  Platelets 150 - 400 K/uL 249 240 279    CMP Latest Ref Rng & Units 05/11/2020 05/10/2020 05/09/2020  Glucose 70 - 99 mg/dL 222(L) 798(X) 211(H)  BUN 6 - 20 mg/dL 18 18 41(D)  Creatinine 0.61 - 1.24 mg/dL 4.08 1.44 8.18  Sodium 135 - 145 mmol/L  138 136 129(L)  Potassium 3.5 - 5.1 mmol/L 3.2(L) 3.4(L) 3.2(L)  Chloride 98 - 111 mmol/L 103 101 97(L)  CO2 22 - 32 mmol/L 21(L) 23 22  Calcium 8.9 - 10.3 mg/dL 9.4 8.9 9.0  Total Protein 6.5 - 8.1 g/dL - - -  Total Bilirubin 0.3 - 1.2 mg/dL - - -  Alkaline Phos 38 - 126 U/L - - -  AST 15 - 41 U/L - - -  ALT 0 - 44 U/L - - -    Imaging: No results found.  Assessment/Plan:   Principal Problem:   Carotid artery occlusion with cerebral infarction [facility]) Active Problems:   Cerebral embolism with cerebral infarction   Aortic aneurysm (HCC)   CVA (cerebral vascular accident) (HCC)   Diabetes mellitus, type 2 (HCC)   Altered mental status   Patient Summary: Aaron Clymer Perdueis a 21 yom with hypertension, hyperlipidemia, obesity, and prior CVA who presented to [facility] with expressive aphasia and found to have left MCA infarct complicated by  progression of MCA infarct and new ICA thrombosis.  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 Partial global aphasia History of cerebellar CVA Continues to have significant expressive aphasia, right-sided neglect, and right-sided upper and lower extremity weakness.  But overall continues to make some improvement each day with strength and speech.  We stressed the importance of early PT. currently working on inpatient rehab placement in [place]. -Continue telemetry -Continue dual antiplatelet therapy with ASA 300 and Plavix 75 mg for 3 months then aspirin alone -Continue atorvastatin 80 mg, goal LDL less than 70 -Slowly restart hypertension medications, currently on lisinopril 20 mg -Patient will need a follow-up CT in 2 to 3 months to assess his left ICA -Currently on dysphagia 1 diet -Continue PT/OT daily and TSE placement for CIR and [place]  Diabetes Patient's diabetes is well controlled on Lantus 10 units and sliding scale insulin. Last hemoglobin A1c of 8.7 and this morning's fasting glucose of 155. -Continue Lantus 10 units daily and SSI  Hyperlipidemia  LDL 122 -Continue atorvastatin80 mg. Goal LDL < 70  Hypertension Patient's blood pressure today is 134/92 on lisinopril 20 mg. We will continue to hold amlodipine statin. Plan to slowly restart antihypertensive medication with goal of normotensive blood pressure  Ascending aortic aneurysmal dilation up to 4.1 cm Follow up outpatient. Recommend annual imaging by CTA or MRA, per neuro.  Diet: Dysphagia 1 IVF: None,None VTE: Enoxaparin Code: Full PT/OT recs: CIR, none. TOC recs: Pending CRR placement in [place]   Dispo: Anticipated discharge to Inpatient rehab in 1 days pending no further work-up.    Please contact the on call pager after 5 pm and on weekends at [phone].

## 2020-05-13 NOTE — Progress Notes (Signed)
  Speech Language Pathology Treatment: Dysphagia;Cognitive-Linquistic  Patient Details Name: Aaron Weiss MRN: [id number] DOB: 1966/01/16 Today's Date: 05/13/2020 Time: 1030-1100 SLP Time Calculation (min) (ACUTE ONLY): 30 min  Assessment / Plan / Recommendation Clinical Impression  Pt seen for dysphagia tx with limited intake of pudding-thickened liquids/puree consistency d/t pt decreased satiety.  Swallow efficiency improved with more timely swallow, right buccal residue noted with lingual sweep encouraged to remove from buccal cavity given min verbal cues.  Pt shook his head when pudding-thickened liquids presented with several tsp ingested with grimacing noted and one incidence of delayed throat clearing.  Continue current diet of dysphagia 1/pudding-thick liquids with repeat MBS as pt progresses for plan.  Aphasia tx completed with pt able to indicate yes/no with 100% accuracy with simple biographical questions/personal info questions with "yes" primary response, but when "no" was accurate answer, he would pause and say "no" or shake head due to decreased overall processing of information.  Followed 1-step commands with 100% accuracy, but 50% accuracy noted with 2-step commands with mod verbal/visual cues provided.  Pt able to name objects within environment with phonemic cues provided and/or sentence completion.  Perseveration noted throughout session with verbal tasks.  Pt able to count, state months of the year and days of the week with min verbal cues to initiate sequence.  Spontaneous speech perseverative with apraxia-like components noted intermittently.  Pt with fluctuating awareness of deficits within verbal responses as he would impulsively answer and then hesitate and attempt to produce another response after a brief amount of time.  ST will continue efforts with aphasia tx; CIR referral recommended.   HPI HPI: 54 y.o. Caucasian male with PMH of HTN but not seeing doctors for years  presented to ED 05/06/20 as code stroke. NIHSS 6 with global partial aphasia; CT head chronic left cerebellar infarct, subacute/chronic left superior parietal infarct, subacute left parietal infarct, acute left frontal infarct. 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 Pt acutely developed right side paresis, facial droop and CT revealed extension of stroke      SLP Plan  Continue with current plan of care       Recommendations  Diet recommendations: Dysphagia 1 (puree);Pudding-thick liquid Liquids provided via: Cup Medication Administration: Crushed with puree Supervision: Staff to assist with self feeding Compensations: Minimize environmental distractions;Slow rate;Small sips/bites;Lingual sweep for clearance of pocketing Postural Changes and/or Swallow Maneuvers: Seated upright 90 degrees                General recommendations: Rehab consult Oral Care Recommendations: Oral care BID Follow up Recommendations: Inpatient Rehab SLP Visit Diagnosis: Dysphagia, oropharyngeal phase (R13.12);Aphasia (R47.01) Plan: Continue with current plan of care                       Tressie Stalker, M.S., CCC-SLP 05/13/2020, 1:56 PM

## 2020-05-13 NOTE — Progress Notes (Signed)
Occupational Therapy Treatment Patient Details Name: Aaron Weiss MRN: [id number] DOB: Nov 05, 1965 Today's Date: 05/13/2020    History of present illness 54 y.o. male presenting with code stroke. NIHSS 6 with global partial aphasia; CT head chronic left cerebellar infarct, subacute/chronic left superior parietal infarct, subacute left parietal infarct, acute left frontal infarct. 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 Code stroke called again on 11/30 around 8:15am due to new R sided weakness, R gaze and R neglect overnight. CT (+) new developing L MCA CVA. CTA head/neck (+) L M1 proximal occusion. PMHx significant for uncontrolled HTN.    OT comments  Pt. Seen for skilled OT session.  Focused on bed mobility and sitting balance as precursor for increasing mobility and adl integration.  Pt. Making gains with supine/sit and sit/supine today mod/max a for trunk and also management of RUE/LE.  Pt. With intermittent un supported sitting approx. 3 min. Initiated scooting towards hob with LUE support on bed rail.  Remains excellent candidate for CIR level therapies.  Presented with motivation and willingness to participate along with noted progress each day with acute level therapies.    Follow Up Recommendations  CIR    Equipment Recommendations  3 in 1 bedside commode    Recommendations for Other Services Rehab consult    Precautions / Restrictions Precautions Precautions: Fall;Other (comment) Precaution Comments: R visual fields deficits, R hemi, aphasia       Mobility Bed Mobility Overal bed mobility: Needs Assistance Bed Mobility: Rolling;Supine to Sit;Sit to Sidelying;Sit to Supine Rolling: Min assist Sidelying to sit: Mod assist;HOB elevated Supine to sit: Max assist Sit to supine: Mod assist Sit to sidelying: Min assist General bed mobility comments: hob elevated. pt. held bed rail with LUE and pulled into sidelying.  able to bring LLE off of bed. assistance for RLE and  rue.  most of assistance was to guide trunk upright.  pt. able to follow command to scoot left hip to have B feet on the floor. intially seated with LUE on bed rail.  able to place hand on lap and maintain periods of un supported sitting.  cued to push to center when he felt he was beginning to lean/lob to R.  intermittent cues to correct but sometimes initiated on his own.  end of session hob flat. instructed pt. we needed to scoot to hob before lying down.  he said "yeah" which he says a lot but did understand.  he grabbed bed rail with LUE and pulled and scooted x3 scoots towards hob.  instructed to lay on LUE and bring bles into bed.  assistance for Rle but brought LLE in without assistance.  Transfers                      Balance Overall balance assessment: Needs assistance Sitting-balance support: Feet supported;Single extremity supported;No upper extremity supported Sitting balance-Leahy Scale: Poor     Standing balance support: Single extremity supported (able to sit for brief periods of time un supported sitting with cues to maintain upright)                               ADL either performed or assessed with clinical judgement   ADL Overall ADL's : Needs assistance/impaired  Vision       Perception     Praxis      Cognition Arousal/Alertness: Awake/alert Behavior During Therapy: Flat affect Overall Cognitive Status: Impaired/Different from baseline                     Current Attention Level: Sustained   Following Commands: Follows one step commands inconsistently;Follows one step commands with increased time Safety/Judgement: Decreased awareness of safety;Decreased awareness of deficits Awareness: Emergent   General Comments: able to state name and birthdate when asked.  followed 1 step commands with increased time but was able to        Exercises     Shoulder  Instructions       General Comments      Pertinent Vitals/ Pain       Pain Assessment: No/denies pain  Home Living                                          Prior Functioning/Environment              Frequency  Min 2X/week        Progress Toward Goals  OT Goals(current goals can now be found in the care plan section)  Progress towards OT goals: Progressing toward goals     Plan Discharge plan remains appropriate    Co-evaluation                 AM-PAC OT "6 Clicks" Daily Activity     Outcome Measure   Help from another person eating meals?: Total Help from another person taking care of personal grooming?: A Lot Help from another person toileting, which includes using toliet, bedpan, or urinal?: Total Help from another person bathing (including washing, rinsing, drying)?: Total Help from another person to put on and taking off regular upper body clothing?: A Lot Help from another person to put on and taking off regular lower body clothing?: Total 6 Click Score: 8    End of Session    OT Visit Diagnosis: Unsteadiness on feet (R26.81);Other abnormalities of gait and mobility (R26.89);Cognitive communication deficit (R41.841);Other symptoms and signs involving cognitive function Symptoms and signs involving cognitive functions: Cerebral infarction   Activity Tolerance Patient tolerated treatment well   Patient Left in bed;with call bell/phone within reach;with bed alarm set   Nurse Communication          Time: 3976-7341 OT Time Calculation (min): 12 min  Charges: OT General Charges $OT Visit: 1 Visit OT Treatments $Therapeutic Activity: 8-22 mins  Boneta Lucks, COTA/L Acute Rehabilitation [phone]   Robet Leu 05/13/2020, 9:10 AM

## 2020-05-13 NOTE — Plan of Care (Signed)

## 2020-05-14 DIAGNOSIS — H1031 Unspecified acute conjunctivitis, right eye: Secondary | ICD-10-CM

## 2020-05-14 LAB — GLUCOSE, CAPILLARY
Glucose-Capillary: 170 mg/dL — ABNORMAL HIGH (ref 70–99)
Glucose-Capillary: 215 mg/dL — ABNORMAL HIGH (ref 70–99)
Glucose-Capillary: 193 mg/dL — ABNORMAL HIGH (ref 70–99)
Glucose-Capillary: 155 mg/dL — ABNORMAL HIGH (ref 70–99)

## 2020-05-14 MED ORDER — ERYTHROMYCIN 5 MG/GM OP OINT
TOPICAL_OINTMENT | Freq: Four times a day (QID) | OPHTHALMIC | Status: DC
  Filled 2020-05-14: qty 3.5

## 2020-05-14 MED ORDER — FLUCONAZOLE 100 MG PO TABS
200.0000 mg | ORAL_TABLET | Freq: Every day | ORAL | Status: DC
  Administered 2020-05-14 – 2020-05-15 (×2): 200 mg via ORAL
  Filled 2020-05-14 (×2): qty 2

## 2020-05-14 NOTE — Progress Notes (Addendum)
HD#8 Subjective:  Overnight Events: No events overnight  Patient resting comfortably in bed.  Patient continues to have mild improvement with speech and vocabulary.  Right sided weakness is appears unchanged from yesterday.  Objective:  Vital signs in last 24 hours: Vitals:   05/13/20 1030 05/13/20 1411 05/13/20 2115 05/14/20 0532  BP:  (!) 145/90 (!) 147/97 136/88  Pulse:  86 77 71  Resp:  18 20 18   Temp:  98.5 F (36.9 C) 98.7 F (37.1 C) 98.6 F (37 C)  TempSrc:   Oral Oral  SpO2:  96% 96% 95%  Weight:      Height: 5\' 6"  (1.676 m)      Supplemental O2: Room Air SpO2: 97 % O2 Flow Rate (L/min): 0   Physical Exam:  Physical Exam Constitutional:      Appearance: Normal appearance.  HENT:     Head: Normocephalic and atraumatic.  Eyes:     Pupils: Pupils are equal, round, and reactive to light.     Comments: Patient with purulent drainage of the right eye with mild conjunctival injection no pain with eye movements, significant right-sided neglect limiting right sided eye movements.  Cardiovascular:     Rate and Rhythm: Normal rate.     Pulses: Normal pulses.     Heart sounds: Normal heart sounds. No murmur heard.   Pulmonary:     Effort: Pulmonary effort is normal.     Breath sounds: Normal breath sounds.  Abdominal:     General: Abdomen is flat.     Palpations: Abdomen is soft.     Tenderness: There is no abdominal tenderness.  Musculoskeletal:        General: Normal range of motion.     Cervical back: Normal range of motion.     Right lower leg: No edema.     Left lower leg: No edema.  Skin:    General: Skin is warm and dry.  Neurological:     Mental Status: He is alert.     Cranial Nerves: Dysarthria present.     Motor: Weakness (RUE 0/5 and RLE 1/5 ) present.     Coordination: Coordination abnormal.     Comments: Progressively improving but significant aphasia with perseveration.  Motor strength 1/5 RLE, 1/5 RUE right-sided facial droop minimal  dysarthria and right-sided neglect  Psychiatric:        Mood and Affect: Mood normal.     Filed Weights   05/06/20 1300 05/08/20 0935 05/10/20 1000  Weight: 96.7 kg 95.2 kg 90 kg     Intake/Output Summary (Last 24 hours) at 05/14/2020 0550 Last data filed at 05/14/2020 0533 Gross per 24 hour  Intake -  Output 200 ml  Net -200 ml   Net IO Since Admission: -2,940.18 mL [05/14/20 0550]  Pertinent Labs: CBC Latest Ref Rng & Units 05/11/2020 05/10/2020 05/09/2020  WBC 4.0 - 10.5 K/uL 13.5(H) 10.6(H) 13.1(H)  Hemoglobin 13.0 - 17.0 g/dL 14/06/2019 05/11/2020 47.0  Hematocrit 39 - 52 % 43.0 42.8 46.7  Platelets 150 - 400 K/uL 249 240 279    CMP Latest Ref Rng & Units 05/11/2020 05/10/2020 05/09/2020  Glucose 70 - 99 mg/dL 14/06/2019) 05/11/2020) 629(U)  BUN 6 - 20 mg/dL 18 18 765(Y)  Creatinine 0.61 - 1.24 mg/dL 650(P 54(S 5.68  Sodium 135 - 145 mmol/L 138 136 129(L)  Potassium 3.5 - 5.1 mmol/L 3.2(L) 3.4(L) 3.2(L)  Chloride 98 - 111 mmol/L 103 101 97(L)  CO2 22 - 32  mmol/L 21(L) 23 22  Calcium 8.9 - 10.3 mg/dL 9.4 8.9 9.0  Total Protein 6.5 - 8.1 g/dL - - -  Total Bilirubin 0.3 - 1.2 mg/dL - - -  Alkaline Phos 38 - 126 U/L - - -  AST 15 - 41 U/L - - -  ALT 0 - 44 U/L - - -    Imaging: No results found.  Assessment/Plan:   Principal Problem:   Carotid artery occlusion with cerebral infarction [facility]) Active Problems:   Cerebral embolism with cerebral infarction   Aortic aneurysm (HCC)   CVA (cerebral vascular accident) (HCC)   Diabetes mellitus, type 2 (HCC)   Altered mental status   Patient Summary: Aaron Weiss a 21 yom with hypertension, hyperlipidemia, obesity, and prior CVA who presented to [facility] with expressive aphasia and found to have left MCA infarct complicated by progression of MCA infarct and new ICA thrombosis.  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 Partial global aphasia History of cerebellar CVA Continues to have significant expressive  aphasia, right-sided neglect, and right-sided upper and lower extremity weakness. Currently working on inpatient rehab placement in [place], may consider inpatient rehab here if unable to find placement in [place] soon.  -Continue telemetry -Continue dual antiplatelet therapy with ASA 325 and Plavix 75 mg for 3 months then aspirin alone -Continue atorvastatin 80 mg, goal LDL less than 70 -Slowly restart hypertension medications, currently on lisinopril 20 mg -Patient will need a follow-up CT in 2 to 3 months to assess his left ICA -Currently on dysphagia 1 diet -Continue PT/OT daily and TSE placement for CIR and [name]  Conjunctivitis  Patient with new purulent drainage from right eye this morning with mild conjunctival injection concerning for bacterial vs viral conjunctivitis. Expect this to self limited will monitor and consider erythromycin drops drainage is persists.  Diabetes Patient's diabetes is well controlled on Lantus 10 units and sliding scale insulin. Last hemoglobin A1c of 8.7 and this morning's fasting glucose of 155. -Continue Lantus 10 units daily and SSI  Hyperlipidemia  LDL 122 -Continue atorvastatin80 mg. Goal LDL < 70  Hypertension Patient's blood pressure today is 136/88 on lisinopril 20 mg. We will continue to hold amlodipine . Plan to slowly restart antihypertensive medication with goal of normotensive blood pressure  Ascending aortic aneurysmal dilation up to 4.1 cm Follow up outpatient. Recommend annual imaging by CTA or MRA, per neuro.  Diet: Dysphagia 1 IVF: None,None VTE: Enoxaparin Code: Full PT/OT recs: CIR, none. TOC recs: Pending CRR placement in [place]   Dispo: Anticipated discharge to Inpatient rehab in 1 days pending no further work-up.   Quincy Simmonds, MD 05/14/20 5:52 AM Pager: [phone]  Please contact the on call pager after 5 pm and on weekends at [phone].

## 2020-05-14 NOTE — Progress Notes (Signed)
Physical Therapy Treatment Patient Details Name: Aaron Weiss MRN: [id number] DOB: [birth year] Today's Date: 05/14/2020    History of Present Illness 54 y.o. male presenting with code stroke. NIHSS 6 with global partial aphasia; CT head chronic left cerebellar infarct, subacute/chronic left superior parietal infarct, subacute left parietal infarct, acute left frontal infarct. 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 Code stroke called again on 11/30 around 8:15am due to new R sided weakness, R gaze and R neglect overnight. CT (+) new developing L MCA CVA. CTA head/neck (+) L M1 proximal occusion. PMHx significant for uncontrolled HTN.     PT Comments    Pt shows decreased need for assistance with bed mobility and EOB sitting today. Pt shows increased independence on sitting and standing balance and transfers with [facility], external feedback from the mirror, and multimodal cueing. Motor and verbal perseveration continues to be shown during this session. Pt able to find visual stimuli in R visual field, attend to the R UE with some trial and error, and lift up with R LE with hooking the L LE. Pt would be an excellent CIR candidate to work on returning to [employer].  Follow Up Recommendations  CIR     Equipment Recommendations  Wheelchair (measurements PT);Wheelchair cushion (measurements PT);Hospital bed;Other (comment)    Recommendations for Other Services Rehab consult     Precautions / Restrictions Precautions Precautions: Fall;Other (comment) Precaution Comments: R visual fields deficits, R hemi, aphasia Restrictions Weight Bearing Restrictions: No    Mobility  Bed Mobility Overal bed mobility: Needs Assistance Bed Mobility: Supine to Sit     Supine to sit: Min assist;HOB elevated     General bed mobility comments: pt min assist for supine to sit with needing verbal and tactile and min physical assist for bringing legs EOB and trunk elevation  Transfers Overall transfer  level: Needs assistance   Transfers: Sit to/from Stand Sit to Stand: From elevated surface;Min assist;+2 physical assistance         General transfer comment: x5 sit to stand with min assist +2 and multimodal cues for placement of R hand on Stedy and preventing R hip IR/adducton with standing. Pt needed multimodal cues for shifting hips to the L and correcting R trunk lean. Pt benefited from performing sit to stands on steady in front of the mirror for external cueing on trunk position  Ambulation/Gait             General Gait Details: not attempted   Stairs             Wheelchair Mobility    Modified Rankin (Stroke Patients Only) Modified Rankin (Stroke Patients Only) Pre-Morbid Rankin Score: No symptoms Modified Rankin: Severe disability     Balance Overall balance assessment: Needs assistance Sitting-balance support: Feet supported;Single extremity supported;No upper extremity supported Sitting balance-Leahy Scale: Fair Sitting balance - Comments: min guard for sitting EOB; pt min assist for sitting on steady for assist and multimodal cueing for maintaing vertical trunk sinc pt had a tendency for R lateral trunk lean Postural control: Right lateral lean   Standing balance-Leahy Scale: Poor Standing balance comment: pt able to stand fully on steady with knees blocked, pulling to stand with L UE, and min assist for even weight distrubtion on LE. pt able to maintain verticality with external cueing of the mirror.  Cognition Arousal/Alertness: Awake/alert Behavior During Therapy: Flat affect Overall Cognitive Status: Impaired/Different from baseline Area of Impairment: Attention;Safety/judgement;Awareness;Problem solving;Orientation                 Orientation Level: Disoriented to;Person;Place;Time;Situation Current Attention Level: Sustained   Following Commands: Follows one step commands inconsistently;Follows one  step commands with increased time Safety/Judgement: Decreased awareness of safety;Decreased awareness of deficits Awareness: Emergent Problem Solving: Difficulty sequencing;Requires verbal cues;Requires tactile cues General Comments: verbally perseverates but this decreased with mobility; did have motor perseveration on last task when asked to do somethig else      Exercises      General Comments        Pertinent Vitals/Pain Pain Assessment: No/denies pain    Home Living                      Prior Function            PT Goals (current goals can now be found in the care plan section) Progress towards PT goals: Progressing toward goals    Frequency    Min 4X/week      PT Plan Current plan remains appropriate    Co-evaluation              AM-PAC PT "6 Clicks" Mobility   Outcome Measure  Help needed turning from your back to your side while in a flat bed without using bedrails?: A Little Help needed moving from lying on your back to sitting on the side of a flat bed without using bedrails?: A Little Help needed moving to and from a bed to a chair (including a wheelchair)?: A Lot Help needed standing up from a chair using your arms (e.g., wheelchair or bedside chair)?: A Little Help needed to walk in hospital room?: Total Help needed climbing 3-5 steps with a railing? : Total 6 Click Score: 13    End of Session Equipment Utilized During Treatment: Gait belt Activity Tolerance: Patient tolerated treatment well Patient left: in chair;with call bell/phone within reach;with chair alarm set Nurse Communication: Mobility status;Need for lift equipment (use of stedy to stand and pivot back to bed) PT Visit Diagnosis: Unsteadiness on feet (R26.81);Difficulty in walking, not elsewhere classified (R26.2);Hemiplegia and hemiparesis Hemiplegia - Right/Left: Right Hemiplegia - dominant/non-dominant: Dominant Hemiplegia - caused by: Cerebral infarction      Time: 3888-2800 PT Time Calculation (min) (ACUTE ONLY): 26 min  Charges:  $Therapeutic Activity: 8-22 mins $Neuromuscular Re-education: 8-22 mins                     Jeri Cos, SPT [id number]  Aaron Weiss 05/14/2020, 9:49 AM

## 2020-05-15 ENCOUNTER — Encounter (HOSPITAL_COMMUNITY): Payer: Self-pay | Admitting: Physical Medicine & Rehabilitation

## 2020-05-15 ENCOUNTER — Other Ambulatory Visit: Payer: Self-pay

## 2020-05-15 ENCOUNTER — Inpatient Hospital Stay (HOSPITAL_COMMUNITY)
Admission: RE | Admit: 2020-05-15 | Discharge: 2020-06-09 | DRG: 057 | Disposition: A | Discharge status: Home Health | Payer: BC Managed Care – PPO | Source: Intra-hospital | Attending: Physical Medicine & Rehabilitation | Admitting: Physical Medicine & Rehabilitation

## 2020-05-15 DIAGNOSIS — I69320 Aphasia following cerebral infarction: Secondary | ICD-10-CM | POA: Diagnosis not present

## 2020-05-15 DIAGNOSIS — D72829 Elevated white blood cell count, unspecified: Secondary | ICD-10-CM | POA: Diagnosis not present

## 2020-05-15 DIAGNOSIS — E785 Hyperlipidemia, unspecified: Secondary | ICD-10-CM | POA: Diagnosis present

## 2020-05-15 DIAGNOSIS — I69390 Apraxia following cerebral infarction: Secondary | ICD-10-CM | POA: Diagnosis present

## 2020-05-15 DIAGNOSIS — K5900 Constipation, unspecified: Secondary | ICD-10-CM | POA: Diagnosis not present

## 2020-05-15 DIAGNOSIS — R001 Bradycardia, unspecified: Secondary | ICD-10-CM | POA: Diagnosis not present

## 2020-05-15 DIAGNOSIS — R7989 Other specified abnormal findings of blood chemistry: Secondary | ICD-10-CM | POA: Diagnosis not present

## 2020-05-15 DIAGNOSIS — Z833 Family history of diabetes mellitus: Secondary | ICD-10-CM

## 2020-05-15 DIAGNOSIS — Z83438 Family history of other disorder of lipoprotein metabolism and other lipidemia: Secondary | ICD-10-CM | POA: Diagnosis not present

## 2020-05-15 DIAGNOSIS — R112 Nausea with vomiting, unspecified: Secondary | ICD-10-CM | POA: Diagnosis not present

## 2020-05-15 DIAGNOSIS — I63512 Cerebral infarction due to unspecified occlusion or stenosis of left middle cerebral artery: Secondary | ICD-10-CM | POA: Diagnosis not present

## 2020-05-15 DIAGNOSIS — I69391 Dysphagia following cerebral infarction: Secondary | ICD-10-CM

## 2020-05-15 DIAGNOSIS — T83098A Other mechanical complication of other indwelling urethral catheter, initial encounter: Secondary | ICD-10-CM | POA: Diagnosis not present

## 2020-05-15 DIAGNOSIS — Y846 Urinary catheterization as the cause of abnormal reaction of the patient, or of later complication, without mention of misadventure at the time of the procedure: Secondary | ICD-10-CM | POA: Diagnosis not present

## 2020-05-15 DIAGNOSIS — R338 Other retention of urine: Secondary | ICD-10-CM | POA: Diagnosis not present

## 2020-05-15 DIAGNOSIS — I712 Thoracic aortic aneurysm, without rupture: Secondary | ICD-10-CM | POA: Diagnosis present

## 2020-05-15 DIAGNOSIS — Z823 Family history of stroke: Secondary | ICD-10-CM

## 2020-05-15 DIAGNOSIS — E119 Type 2 diabetes mellitus without complications: Secondary | ICD-10-CM | POA: Diagnosis present

## 2020-05-15 DIAGNOSIS — I6522 Occlusion and stenosis of left carotid artery: Secondary | ICD-10-CM | POA: Diagnosis present

## 2020-05-15 DIAGNOSIS — R1312 Dysphagia, oropharyngeal phase: Secondary | ICD-10-CM | POA: Diagnosis present

## 2020-05-15 DIAGNOSIS — I10 Essential (primary) hypertension: Secondary | ICD-10-CM | POA: Diagnosis present

## 2020-05-15 LAB — CBC
HCT: 52 % (ref 39.0–52.0)
Hemoglobin: 17.5 g/dL — ABNORMAL HIGH (ref 13.0–17.0)
MCH: 30.3 pg (ref 26.0–34.0)
MCHC: 33.7 g/dL (ref 30.0–36.0)
MCV: 90 fL (ref 80.0–100.0)
Platelets: 431 10*3/uL — ABNORMAL HIGH (ref 150–400)
RBC: 5.78 MIL/uL (ref 4.22–5.81)
RDW: 12.8 % (ref 11.5–15.5)
WBC: 12.8 10*3/uL — ABNORMAL HIGH (ref 4.0–10.5)
nRBC: 0 % (ref 0.0–0.2)

## 2020-05-15 LAB — BASIC METABOLIC PANEL
Anion gap: 13 (ref 5–15)
BUN: 26 mg/dL — ABNORMAL HIGH (ref 6–20)
CO2: 24 mmol/L (ref 22–32)
Calcium: 9.7 mg/dL (ref 8.9–10.3)
Chloride: 105 mmol/L (ref 98–111)
Creatinine, Ser: 0.97 mg/dL (ref 0.61–1.24)
GFR, Estimated: >60 mL/min (ref >60)
Glucose, Bld: 158 mg/dL — ABNORMAL HIGH (ref 70–99)
Potassium: 4.5 mmol/L (ref 3.5–5.1)
Sodium: 142 mmol/L (ref 135–145)

## 2020-05-15 LAB — CREATININE, SERUM
Creatinine, Ser: 1.14 mg/dL (ref 0.61–1.24)
GFR, Estimated: >60 mL/min (ref >60)

## 2020-05-15 LAB — GLUCOSE, CAPILLARY
Glucose-Capillary: 200 mg/dL — ABNORMAL HIGH (ref 70–99)
Glucose-Capillary: 213 mg/dL — ABNORMAL HIGH (ref 70–99)
Glucose-Capillary: 199 mg/dL — ABNORMAL HIGH (ref 70–99)
Glucose-Capillary: 196 mg/dL — ABNORMAL HIGH (ref 70–99)

## 2020-05-15 MED ORDER — RESOURCE THICKENUP CLEAR PO POWD
ORAL | Status: DC | PRN
  Filled 2020-05-15: qty 125

## 2020-05-15 MED ORDER — ENOXAPARIN SODIUM 40 MG/0.4ML ~~LOC~~ SOLN: 40.0000 mg | SUBCUTANEOUS | Status: DC

## 2020-05-15 MED ORDER — ENOXAPARIN SODIUM 40 MG/0.4ML ~~LOC~~ SOLN
40.0000 mg | SUBCUTANEOUS | Status: DC
  Administered 2020-05-15 – 2020-06-08 (×25): 40 mg via SUBCUTANEOUS
  Filled 2020-05-15 (×25): qty 0.4

## 2020-05-15 MED ORDER — INSULIN GLARGINE 100 UNIT/ML ~~LOC~~ SOLN
13.0000 [IU] | Freq: Every day | SUBCUTANEOUS | 11 refills | Status: DC
Start: 2020-05-15 — End: 2020-06-09

## 2020-05-15 MED ORDER — LISINOPRIL 20 MG PO TABS
20.0000 mg | ORAL_TABLET | Freq: Every day | ORAL | 0 refills | Status: DC
Start: 2020-05-16 — End: 2020-06-09

## 2020-05-15 MED ORDER — ASPIRIN 325 MG PO TBEC: 325.0000 mg | DELAYED_RELEASE_TABLET | Freq: Every day | ORAL | 0 refills | Status: AC

## 2020-05-15 MED ORDER — ERYTHROMYCIN 5 MG/GM OP OINT
1.0000 "application " | TOPICAL_OINTMENT | Freq: Four times a day (QID) | OPHTHALMIC | Status: AC
  Administered 2020-05-15 – 2020-05-21 (×23): 1 via OPHTHALMIC
  Filled 2020-05-15: qty 3.5

## 2020-05-15 MED ORDER — ATORVASTATIN CALCIUM 80 MG PO TABS
80.0000 mg | ORAL_TABLET | Freq: Every day | ORAL | Status: DC
Start: 2020-05-16 — End: 2020-06-07

## 2020-05-15 MED ORDER — ERYTHROMYCIN 5 MG/GM OP OINT: TOPICAL_OINTMENT | Freq: Four times a day (QID) | OPHTHALMIC | Status: DC

## 2020-05-15 MED ORDER — ACETAMINOPHEN 325 MG PO TABS
650.0000 mg | ORAL_TABLET | ORAL | Status: DC | PRN
  Administered 2020-05-23 – 2020-05-28 (×2): 650 mg via ORAL
  Filled 2020-05-15 (×4): qty 2

## 2020-05-15 MED ORDER — SENNOSIDES-DOCUSATE SODIUM 8.6-50 MG PO TABS
1.0000 | ORAL_TABLET | Freq: Every evening | ORAL | Status: DC | PRN
  Administered 2020-05-21: 21:00:00 1 via ORAL
  Filled 2020-05-15: qty 1

## 2020-05-15 MED ORDER — ASPIRIN EC 325 MG PO TBEC
325.0000 mg | DELAYED_RELEASE_TABLET | Freq: Every day | ORAL | Status: DC
  Administered 2020-05-16 – 2020-06-09 (×25): 325 mg via ORAL
  Filled 2020-05-15 (×26): qty 1

## 2020-05-15 MED ORDER — FLUCONAZOLE 200 MG PO TABS
200.0000 mg | ORAL_TABLET | Freq: Every day | ORAL | Status: DC
Start: 2020-05-16 — End: 2020-06-09

## 2020-05-15 MED ORDER — ATORVASTATIN CALCIUM 80 MG PO TABS
80.0000 mg | ORAL_TABLET | Freq: Every day | ORAL | Status: DC
  Administered 2020-05-16 – 2020-06-09 (×25): 80 mg via ORAL
  Filled 2020-05-15 (×26): qty 1

## 2020-05-15 MED ORDER — ERYTHROMYCIN 5 MG/GM OP OINT
TOPICAL_OINTMENT | Freq: Four times a day (QID) | OPHTHALMIC | 0 refills | Status: DC
Start: 2020-05-15 — End: 2020-06-09

## 2020-05-15 MED ORDER — ASPIRIN 300 MG RE SUPP
300.0000 mg | Freq: Every day | RECTAL | Status: DC
  Filled 2020-05-15 (×12): qty 1

## 2020-05-15 MED ORDER — CLOPIDOGREL BISULFATE 75 MG PO TABS
75.0000 mg | ORAL_TABLET | Freq: Every day | ORAL | Status: DC
  Administered 2020-05-16 – 2020-06-09 (×25): 75 mg via ORAL
  Filled 2020-05-15 (×26): qty 1

## 2020-05-15 MED ORDER — ACETAMINOPHEN 160 MG/5ML PO SOLN: 650.0000 mg | ORAL | Status: DC | PRN

## 2020-05-15 MED ORDER — INSULIN GLARGINE 100 UNIT/ML ~~LOC~~ SOLN
10.0000 [IU] | Freq: Every day | SUBCUTANEOUS | Status: DC
  Administered 2020-05-15: 10 [IU] via SUBCUTANEOUS
  Filled 2020-05-15 (×2): qty 0.1

## 2020-05-15 MED ORDER — FLUCONAZOLE 100 MG PO TABS
200.0000 mg | ORAL_TABLET | Freq: Every day | ORAL | Status: AC
  Administered 2020-05-16 – 2020-05-20 (×5): 200 mg via ORAL
  Filled 2020-05-15 (×5): qty 2

## 2020-05-15 MED ORDER — LISINOPRIL 20 MG PO TABS
20.0000 mg | ORAL_TABLET | Freq: Every day | ORAL | Status: DC
  Administered 2020-05-16 – 2020-06-02 (×18): 20 mg via ORAL
  Filled 2020-05-15 (×18): qty 1

## 2020-05-15 MED ORDER — CLOPIDOGREL BISULFATE 75 MG PO TABS
75.0000 mg | ORAL_TABLET | Freq: Every day | ORAL | Status: DC
Start: 2020-05-16 — End: 2020-06-07

## 2020-05-15 MED ORDER — ACETAMINOPHEN 650 MG RE SUPP: 650.0000 mg | RECTAL | Status: DC | PRN

## 2020-05-15 MED ORDER — INSULIN ASPART 100 UNIT/ML ~~LOC~~ SOLN
0.0000 [IU] | Freq: Three times a day (TID) | SUBCUTANEOUS | Status: DC
  Administered 2020-05-15: 3 [IU] via SUBCUTANEOUS
  Administered 2020-05-16 (×2): 5 [IU] via SUBCUTANEOUS
  Administered 2020-05-16: 3 [IU] via SUBCUTANEOUS
  Administered 2020-05-17: 5 [IU] via SUBCUTANEOUS
  Administered 2020-05-17 – 2020-05-18 (×3): 3 [IU] via SUBCUTANEOUS
  Administered 2020-05-18 – 2020-05-19 (×4): 2 [IU] via SUBCUTANEOUS
  Administered 2020-05-19: 3 [IU] via SUBCUTANEOUS
  Administered 2020-05-20 – 2020-05-22 (×4): 2 [IU] via SUBCUTANEOUS
  Administered 2020-05-22 – 2020-05-23 (×2): 3 [IU] via SUBCUTANEOUS
  Administered 2020-05-23: 17:00:00 2 [IU] via SUBCUTANEOUS
  Administered 2020-05-24: 12:00:00 3 [IU] via SUBCUTANEOUS
  Administered 2020-05-24 – 2020-05-30 (×3): 2 [IU] via SUBCUTANEOUS
  Administered 2020-06-03: 17:00:00 3 [IU] via SUBCUTANEOUS
  Administered 2020-06-04: 08:00:00 1 [IU] via SUBCUTANEOUS
  Administered 2020-06-04: 12:00:00 3 [IU] via SUBCUTANEOUS
  Administered 2020-06-04 – 2020-06-06 (×6): 2 [IU] via SUBCUTANEOUS
  Administered 2020-06-07: 12:00:00 1 [IU] via SUBCUTANEOUS
  Administered 2020-06-08: 12:00:00 2 [IU] via SUBCUTANEOUS

## 2020-05-15 NOTE — Plan of Care (Signed)
  Problem: Clinical Measurements: Goal: Respiratory complications will improve Outcome: Progressing   Problem: Clinical Measurements: Goal: Cardiovascular complication will be avoided Outcome: Progressing   Problem: Coping: Goal: Level of anxiety will decrease Outcome: Progressing   Problem: Nutrition: Goal: Adequate nutrition will be maintained Outcome: Not Progressing  Poor intake

## 2020-05-15 NOTE — H&P (Signed)
Physical Medicine and Rehabilitation Admission H&P        Chief Complaint  Patient presents with  . Code Stroke  : HPI: Aaron Weiss is a 54 year old right-handed male with history of hypertension but not seeing a doctor for years.  Per chart review lives with brother and son.  Reportedly independent prior to admission.  Two-level home bed and bath upstairs.  Presented 05/06/2020 with headache and altered mental status with aphasia.  Blood pressure 150/90.  Glucose 258.  Cranial CT scan concerning for acute left MCA territory infarction.  No acute hemorrhage.  Remote left cerebellar infarct.  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  Patient did not receive TPA.  CT cerebral perfusion scan partially imaged ascending aortic aneurysmal dilation up to 4.1 cm.  Recommendations for annual imaging followed by CTA or MRA.  MRI follow-up showed large acute early subacute left MCA infarction.  Echocardiogram with ejection fraction of 60 to 65% no wall motion abnormalities grade 1 diastolic dysfunction.  TEE showed normal ejection fraction without thrombus.  Admission chemistries glucose 261 hemoglobin A1c 8.7 urine drug screen negative.  Currently maintained on aspirin 325 mg and Plavix for CVA prophylaxis x3 months then aspirin alone.  Subcutaneous Lovenox for DVT prophylaxis.  Currently maintained on a dysphagia #1 pudding thick liquid.  Therapy evaluations completed and patient was admitted for a comprehensive rehab program.   Review of Systems  Constitutional: Negative for fever.  HENT: Negative for hearing loss.   Eyes: Negative for blurred vision and double vision.  Respiratory: Negative for cough and shortness of breath.   Cardiovascular: Negative for chest pain, palpitations and leg swelling.  Gastrointestinal: Positive for constipation. Negative for heartburn, nausea and vomiting.  Genitourinary: Negative for dysuria, flank  pain and hematuria.  Musculoskeletal: Positive for myalgias. Negative for falls.  Skin: Negative for rash.  Neurological: Positive for speech change, weakness and headaches.  All other systems reviewed and are negative.   History reviewed. No pertinent past medical history.      Past Surgical History:  Procedure Laterality Date  . BUBBLE STUDY   05/10/2020    Procedure: BUBBLE STUDY;  Surgeon: Jake BatheSkains, Mark C, MD;  [facility];  Service: Cardiovascular;;  . TEE WITHOUT CARDIOVERSION N/A 05/10/2020    Procedure: TRANSESOPHAGEAL ECHOCARDIOGRAM (TEE);  Surgeon: Jake BatheSkains, Mark C, MD;  [facility];  Service: Cardiovascular;  Laterality: N/A;         Family History  Problem Relation Age of Onset  . Stroke Father    . Diabetes Sister    . High Cholesterol Brother      Social History:  reports that he has never smoked. He has never used smokeless tobacco. He reports previous drug use. No history on file for alcohol use. Allergies: No Known Allergies       Medications Prior to Admission  Medication Sig Dispense Refill  . aspirin EC 81 MG tablet Take 81-162 mg by mouth as needed for mild pain (or headaches). Swallow whole.      . ibuprofen (ADVIL) 200 MG tablet Take 200-400 mg by mouth every 6 (six) hours as needed for mild pain (or headaches).          Drug Regimen Review Drug regimen was reviewed and remains appropriate with no significant issues identified   Home: Home Living Family/patient expects to be discharged to:: Private residence Living Arrangements: Alone Available Help at Discharge: Family  Type of Home: Apartment Home Access: Level entry Home Layout: Two level, Bed/bath upstairs Alternate Level Stairs-Number of Steps: townhome style Bathroom Shower/Tub: Engineer, manufacturing systems: Standard Home Equipment: None Additional Comments: ?accuracy of information by patient using yes/no ?'s. Expressive and receptive difficulties  Lives With: Alone    Functional History: Prior Function Level of Independence: Independent Comments: believe he was working, but unsure   Functional Status:  Mobility: Bed Mobility Overal bed mobility: Needs Assistance Bed Mobility: Supine to Sit Rolling: Min assist Sidelying to sit: Mod assist, HOB elevated Supine to sit: Min assist, HOB elevated Sit to supine: Mod assist Sit to sidelying: Min assist General bed mobility comments: pt min assist for supine to sit with needing verbal and tactile and min physical assist for bringing legs EOB and trunk elevation Transfers Overall transfer level: Needs assistance Equipment used: None Transfer via Lift Equipment: Stedy Transfers: Sit to/from Stand Sit to Stand: From elevated surface, Min assist, +2 physical assistance Stand pivot transfers: Mod assist, From elevated surface General transfer comment: x5 sit to stand with min assist +2 and multimodal cues for placement of R hand on Stedy and preventing R hip IR/adducton with standing. Pt needed multimodal cues for shifting hips to the L and correcting R trunk lean. Pt benefited from performing sit to stands on steady in front of the mirror for external cueing on trunk position Ambulation/Gait Ambulation/Gait assistance: Min guard, Min assist Gait Distance (Feet): 250 Feet Assistive device: None Gait Pattern/deviations: Step-through pattern, Decreased stride length, Drifts right/left General Gait Details: not attempted Gait velocity: decr   ADL: ADL Overall ADL's : Needs assistance/impaired Eating/Feeding: Maximal assistance Eating/Feeding Details (indicate cue type and reason): Pt with only a small corner of breakfast consumed. needs more assist during feeding during typical day Grooming: Total assistance Grooming Details (indicate cue type and reason): washed face with wash cloth when it was handed to him Upper Body Dressing : Total assistance Upper Body Dressing Details (indicate cue type and  reason): To don anterior hospital gown in supine Lower Body Dressing: Total assistance Lower Body Dressing Details (indicate cue type and reason): To don footwear lying supine in bed Toilet Transfer: Maximal assistance, +2 for physical assistance, +2 for safety/equipment Toilet Transfer Details (indicate cue type and reason): Simulated with stand-pivot transfer to recliner with Max A +2 and +2 hand-held assist. 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, hemiperesis   Cognition: Cognition Overall Cognitive Status: Impaired/Different from baseline Arousal/Alertness: Awake/alert Orientation Level: Disoriented X4 Attention: Sustained Sustained Attention: Appears intact Memory:  (to be assessed) Awareness: Impaired Awareness Impairment: Emergent impairment Problem Solving:  (BD) Safety/Judgment: Impaired Cognition Arousal/Alertness: Awake/alert Behavior During Therapy: Flat affect Overall Cognitive Status: Impaired/Different from baseline Area of Impairment: Attention, Safety/judgement, Awareness, Problem solving, Orientation Orientation Level: Disoriented to, Person, Place, Time, Situation Current Attention Level: Sustained Memory: Decreased short-term memory Following Commands: Follows one step commands inconsistently, Follows one step commands with increased time Safety/Judgement: Decreased awareness of safety, Decreased awareness of deficits Awareness: Emergent Problem Solving: Difficulty sequencing, Requires verbal cues, Requires tactile cues General Comments: verbally perseverates but this decreased with mobility; did have motor  perseveration on last task when asked  to do somethig else Difficult to assess due to: Impaired communication (expressive aphasia)   Physical Exam: Blood pressure (!) 135/94, pulse 87, temperature 98.9 F (37.2 C), temperature source Oral, resp. rate 16, height 5\' 6"  (1.676 m), weight 90 kg, SpO2 94 %. Physical Exam Neurological:     Comments: Patient is alert in no acute distress.  Globally aphasic.  He was able to provide some yes and no responses.  Inconsistent to follow commands.      General: No acute distress Mood and affect are appropriate Heart: Regular rate and rhythm no rubs murmurs or extra sounds Lungs: Clear to auscultation, breathing unlabored, no rales or wheezes Abdomen: Positive bowel sounds, soft nontender to palpation, nondistended Extremities: No clubbing, cyanosis, or edema Skin: No evidence of breakdown, no evidence of rash Neurologic: Cranial nerves II through XII intact, motor strength is 5/5 in left deltoid, bicep, tricep, grip, hip flexor, knee extensors, ankle dorsiflexor and plantar flexor 0/5 on right in the upper an lower limb Sensory exam cannot assess due to aphasia Cerebellar exam difficulty cooperating due to aphasia/apraxia LUE   , cannot perform with RUE due to weakness Musculoskeletal: Full range of motion in all 4 extremities. No joint swelling    Lab Results Last 48 Hours        Results for orders placed or performed during the hospital encounter of 05/06/20 (from the past 48 hour(s))  Glucose, capillary     Status: Abnormal    Collection Time: 05/13/20  7:47 AM  Result Value Ref Range    Glucose-Capillary 148 (H) 70 - 99 mg/dL      Comment: Glucose reference range applies only to samples taken after fasting for at least 8 hours.  Glucose, capillary     Status: Abnormal    Collection Time: 05/13/20 11:38 AM  Result Value Ref Range    Glucose-Capillary 160 (H) 70 - 99 mg/dL      Comment: Glucose reference range applies only to samples taken  after fasting for at least 8 hours.  Glucose, capillary     Status: Abnormal    Collection Time: 05/13/20  4:13 PM  Result Value Ref Range    Glucose-Capillary 174 (H) 70 - 99 mg/dL      Comment: Glucose reference range applies only to samples taken after fasting for at least 8 hours.  Glucose, capillary     Status: Abnormal    Collection Time: 05/13/20  9:13 PM  Result Value Ref Range    Glucose-Capillary 164 (H) 70 - 99 mg/dL      Comment: Glucose reference range applies only to samples taken after fasting for at least 8 hours.  Glucose, capillary     Status: Abnormal    Collection Time: 05/14/20  7:36 AM  Result Value Ref Range    Glucose-Capillary 170 (H) 70 - 99 mg/dL      Comment: Glucose reference range applies only to samples taken after fasting for at least 8 hours.  Glucose, capillary     Status: Abnormal    Collection Time: 05/14/20 11:38 AM  Result Value Ref Range    Glucose-Capillary 215 (H) 70 - 99 mg/dL      Comment: Glucose reference range applies only to samples taken after fasting for at least 8 hours.  Glucose, capillary     Status: Abnormal    Collection Time: 05/14/20  3:48 PM  Result Value Ref Range    Glucose-Capillary 193 (H) 70 - 99 mg/dL  Comment: Glucose reference range applies only to samples taken after fasting for at least 8 hours.  Glucose, capillary     Status: Abnormal    Collection Time: 05/14/20  9:32 PM  Result Value Ref Range    Glucose-Capillary 155 (H) 70 - 99 mg/dL      Comment: Glucose reference range applies only to samples taken after fasting for at least 8 hours.  Basic metabolic panel     Status: Abnormal    Collection Time: 05/15/20  1:49 AM  Result Value Ref Range    Sodium 142 135 - 145 mmol/L    Potassium 4.5 3.5 - 5.1 mmol/L      Comment: SPECIMEN HEMOLYZED. HEMOLYSIS MAY AFFECT INTEGRITY OF RESULTS.    Chloride 105 98 - 111 mmol/L    CO2 24 22 - 32 mmol/L    Glucose, Bld 158 (H) 70 - 99 mg/dL      Comment: Glucose  reference range applies only to samples taken after fasting for at least 8 hours.    BUN 26 (H) 6 - 20 mg/dL    Creatinine, Ser 1.54 0.61 - 1.24 mg/dL    Calcium 9.7 8.9 - 00.8 mg/dL    GFR, Estimated >67 >61 mL/min      Comment: (NOTE) Calculated using the CKD-EPI Creatinine Equation (2021)      Anion gap 13 5 - 15      Comment: Performed at [facility], [address] [place], [place] [address]      Imaging Results (Last 48 hours)  No results found.           Medical Problem List and Plan: 1.  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             -patient may  shower             -ELOS/Goals: sup/minA, 18-21d 2.  Antithrombotics: -DVT/anticoagulation: Lovenox             -antiplatelet therapy: Aspirin 325 mg daily and Plavix 75 mg daily x3 months then aspirin alone 3. Pain Management: All in all as needed 4. Mood: Provide emotional support             -antipsychotic agents: N/A 5. Neuropsych: This patient is not capable of making decisions on his own behalf. 6. Skin/Wound Care: Routine skin checks 7. Fluids/Electrolytes/Nutrition: Routine in and outs with follow-up chemistries 8.  Hypertension.  Lisinopril 20 mg daily.  Monitor with increased mobility 9.  Dysphagia.  Dysphagia #1 pudding thick liquid.  Monitor hydration.  Speech therapy follow-up 10.  New findings diabetes mellitus.  Hemoglobin A1c 8.7.  Lantus insulin 10 units nightly.  Diabetic teaching 11.  Hyperlipidemia.  Lipitor 12.  Ascending aortic aneurysm with dilation 4.1 cm.  Recommendations annual imaging followed by CTA or MRA.    Mcarthur Rossetti Angiulli, PA-C 05/15/2020 "I have personally performed a face to face diagnostic evaluation of this patient.  Additionally, I have reviewed and concur with the physician assistant's documentation above." Erick Colace M.D. [facility] FAAPM&R  (Neuromuscular Med) Diplomate Am Board of Electrodiagnostic Med Fellow Am Board of Interventional Pain

## 2020-05-15 NOTE — Progress Notes (Signed)
Occupational Therapy Treatment Patient Details Name: Aaron Weiss MRN: [id number] DOB: [birth year] Today's Date: 05/15/2020    History of present illness 54 y.o. male presenting with code stroke. NIHSS 6 with global partial aphasia; CT head chronic left cerebellar infarct, subacute/chronic left superior parietal infarct, subacute left parietal infarct, acute left frontal infarct. 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 Code stroke called again on 11/30 around 8:15am due to new R sided weakness, R gaze and R neglect overnight. CT (+) new developing L MCA CVA. CTA head/neck (+) L M1 proximal occusion. PMHx significant for uncontrolled HTN.    OT comments  Patient seated in recliner and agreeable to OT session. Pt completing grooming tasks at sink with mod assist, requires multimodal cueing to complete full face washing (R side) and wash hands (cueing to attend to/complete R hand and full assist with L hand); noted patient perseverates on washing face after completion of task.  Able to locate items on R side with cueing 3/3 given increased time for processing and scanning.  Sit to stand at sink with max assist +1, pulling on sink and therapist blocking R LE for minimal weightbearing into R hand.  Seated PROM of R UE from shoulder to hand.  Seated in recliner with chair alarm on, call bell in hand at completion of session. Will follow acutely.    Follow Up Recommendations  CIR    Equipment Recommendations  3 in 1 bedside commode    Recommendations for Other Services Rehab consult    Precautions / Restrictions Precautions Precautions: Fall;Other (comment) Precaution Comments: R visual fields deficits, R hemi, aphasia Restrictions Weight Bearing Restrictions: No       Mobility Bed Mobility Overal bed mobility: Needs Assistance Bed Mobility: Supine to Sit     Supine to sit: Min assist     General bed mobility comments: OOB in recliner   Transfers Overall transfer level:  Needs assistance Equipment used: None Transfers: Sit to/from Stand Sit to Stand: Max assist         General transfer comment: sit to stand at sink x 2 with R LE blocked and pt pulling on sink given max assist     Balance Overall balance assessment: Needs assistance Sitting-balance support: Feet supported;Single extremity supported Sitting balance-Leahy Scale: Poor Sitting balance - Comments: min guard to min assist sitting unsupported in recliner    Standing balance support: Bilateral upper extremity supported;During functional activity Standing balance-Leahy Scale: Poor Standing balance comment: max assist to maintain standing at sink with R LE blocked attempting to weightbear into R UE                            ADL either performed or assessed with clinical judgement   ADL Overall ADL's : Needs assistance/impaired     Grooming: Moderate assistance;Sitting Grooming Details (indicate cue type and reason): sitting at sink, washed face with multimodal cueing for thoroughness; washing hands with mod assist and requires cueing for completion of R side              Lower Body Dressing: Total assistance;Sit to/from stand Lower Body Dressing Details (indicate cue type and reason): to manage clothing, sit to stand with max assist at sink              Functional mobility during ADLs: Maximal assistance       Vision   Additional Comments: cueing to locate items on R side  with auditory and tactile cueing    Perception     Praxis      Cognition Arousal/Alertness: Awake/alert Behavior During Therapy: Flat affect Overall Cognitive Status: Impaired/Different from baseline Area of Impairment: Attention;Safety/judgement;Awareness;Problem solving;Orientation                 Orientation Level: Disoriented to;Place;Time;Situation Current Attention Level: Sustained Memory: Decreased short-term memory;Decreased recall of precautions Following Commands:  Follows one step commands with increased time Safety/Judgement: Decreased awareness of safety;Decreased awareness of deficits Awareness: Emergent Problem Solving: Difficulty sequencing;Requires verbal cues;Requires tactile cues;Decreased initiation;Slow processing General Comments: patient able to follow most simple 1 step commands with increased time, difficulty sequencing, attending to tasks and requires redirection with multimodal cueing        Exercises Other Exercises Other Exercises: PROM at hand, wrist, elbow, shoulder on R side   Shoulder Instructions       General Comments      Pertinent Vitals/ Pain       Pain Assessment: Faces Faces Pain Scale: No hurt Pain Intervention(s): Monitored during session  Home Living                                          Prior Functioning/Environment              Frequency  Min 2X/week        Progress Toward Goals  OT Goals(current goals can now be found in the care plan section)  Progress towards OT goals: Progressing toward goals  Acute Rehab OT Goals Patient Stated Goal: Unable to state OT Goal Formulation: Patient unable to participate in goal setting  Plan Discharge plan remains appropriate;Frequency remains appropriate    Co-evaluation                 AM-PAC OT "6 Clicks" Daily Activity     Outcome Measure   Help from another person eating meals?: Total Help from another person taking care of personal grooming?: A Lot Help from another person toileting, which includes using toliet, bedpan, or urinal?: Total Help from another person bathing (including washing, rinsing, drying)?: Total Help from another person to put on and taking off regular upper body clothing?: A Lot Help from another person to put on and taking off regular lower body clothing?: Total 6 Click Score: 8    End of Session Equipment Utilized During Treatment: Gait belt  OT Visit Diagnosis: Unsteadiness on feet  (R26.81);Other abnormalities of gait and mobility (R26.89);Cognitive communication deficit (R41.841);Other symptoms and signs involving cognitive function Symptoms and signs involving cognitive functions: Cerebral infarction   Activity Tolerance Patient tolerated treatment well   Patient Left in bed;with call bell/phone within reach   Nurse Communication Mobility status        Time: 2993-7169 OT Time Calculation (min): 20 min  Charges: OT General Charges $OT Visit: 1 Visit OT Treatments $Self Care/Home Management : 8-22 mins  Barry Brunner, OT Acute Rehabilitation Services Pager [phone] Office [phone]    Chancy Milroy 05/15/2020, 12:38 PM

## 2020-05-15 NOTE — Progress Notes (Signed)
Report was given to [place] at [facility] rehab floor Pt. Transferred in stable condition to 4th floor. All belongings sent with pt.

## 2020-05-15 NOTE — Progress Notes (Signed)
Charlett Blake, MD  Physician  Physical Medicine and Rehabilitation  PMR Pre-admission      Signed  Date of Service:  05/08/2020  1:55 PM      Related encounter: ED to Hosp-Admission (Current) from 05/06/2020 in [place] [facility] Progressive [facility]      Signed       PMR Admission Coordinator Pre-Admission Assessment   Patient: Aaron Weiss is an 54 y.o., male MRN: [id number] DOB: 04-Oct-1965 Height: '5\' 6"'  (167.6 cm) Weight: 90 kg                                                                                                                                                  Insurance Information HMO:     PPO: yes     PCP:      IPA:      80/20:      OTHER:  PRIMARY: [employer] [place]      Policy#: [id number]      Subscriber: patient CM Name: Bubba Hales       Phone#: [phone]     Fax#: [phone] Pre-Cert#: [id number]      Employer:  Josem Kaufmann provided by Bubba Hales for admit to CIR. Pt is approved from 12/6 to 12/13 with updates due 12/13. Continued stay review to go to (f): [phone] Benefits:  Phone #: [phone]     Name:  Eff. Date: 06/10/2016- still active     Deduct: $1,500 ($0 met)      Out of Pocket Max: $6,500 ($0 met)      Life Max: NA  CIR: $300 per day co-pay for days 1-5      SNF: after deductible, benefits are not for extended care Outpatient: after deductible, 80% coverage, 20% co-insurance; limit 30 visits     Home Health: after deductible, 100% coverage, 0% co-insurance; pre-auth requried     DME: after deductible, 80% coverage, 20% co-insurance     Providers:  SECONDARY: None      Policy#:       Phone#:    Development worker, community:       Phone#:    The Engineer, petroleum" for patients in Inpatient Rehabilitation Facilities with attached "Privacy Act [place] Records" was provided and verbally reviewed with: N/A   Emergency Contact Information         Contact Information     Name Relation Home Work Ferguson Son      [phone]    [place] Brother     [phone]    Jerame, Hed[place] Sister     [phone]       Current Medical History  Patient Admitting Diagnosis:  Carotid artery occlusion with cerebral infarction   History of Present Illness: Jeanmarc Viernes is a 54 y.o. right-handed male with history of hypertension but not seeing a  doctor for years.  Per chart review lives with brother and son.  Reportedly independent prior to admission.  Two-level home bed and bath upstairs.  Presented 05/06/2020 with headache and altered mental status/aphasia.  Blood pressure 150s/90, glucose 258.  Cranial CT scan findings concerning for acute left MCA territory infarction.  No acute hemorrhage.  Remote left cerebellar infarct.  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  Patient did not receive TPA.  CT cerebral perfusion scan partially imaged ascending aortic aneurysmal dilation up to 4.1 cm.  Recommendations for annual imaging followed by CTA or MRA.  MRI follow-up showed large acute early subacute left MCA infarction.  Echocardiogram with ejection fraction of 60 to 65% no wall motion abnormalities grade 1 diastolic dysfunction.  Admission chemistries glucose 261, hemoglobin A1c 8.7, urine drug screen negative.  Currently maintained on aspirin 325 mg and Plavix for CVA prophylaxis x3 months then aspirin alone.  Subcutaneous Lovenox for DVT prophylaxis.  Tolerating a regular consistency diet.  Therapy evaluations completed with recommendations of physical medicine rehab. On 11/30 pt was noted to be flaccid on the right side and code stroke was called. NIHSS 22 with pt disoriented, not following commands, left gaze preference, right hemianopia, facial droop, right arm and leg weakness, right sided decreased sensation, global aphasia, dysarthria, and sensory neglect noted. CT and CTA of head and neck was preformed and revealed a left M1 proximal  occlusion. TEE was recommended. Pt re-evaluated by therapies with pt showing increased deficits. IP Rehab continued to be recommended. Pt is to admit to CIR on 05/15/20.    Complete NIHSS TOTAL: 19   Past Medical History  History reviewed. No pertinent past medical history.   Family History  family history includes Diabetes in his sister; High Cholesterol in his brother; Stroke in his father.   Prior Rehab/Hospitalizations:  Has the patient had prior rehab or hospitalizations prior to admission? No   Has the patient had major surgery during 100 days prior to admission? No   Current Medications    Current Facility-Administered Medications:  .   stroke: mapping our early stages of recovery book, , Does not apply, Once, Gaylan Gerold, DO .  acetaminophen (TYLENOL) tablet 650 mg, 650 mg, Oral, Q4H PRN, 650 mg at 05/13/20 1619 **OR** acetaminophen (TYLENOL) 160 MG/5ML solution 650 mg, 650 mg, Per Tube, Q4H PRN, 650 mg at 05/14/20 0935 **OR** acetaminophen (TYLENOL) suppository 650 mg, 650 mg, Rectal, Q4H PRN, Gaylan Gerold, DO .  aspirin EC tablet 325 mg, 325 mg, Oral, Daily, 325 mg at 05/15/20 0856 **OR** aspirin suppository 300 mg, 300 mg, Rectal, Daily, Gaylan Gerold, DO .  atorvastatin (LIPITOR) tablet 80 mg, 80 mg, Oral, Daily, Gaylan Gerold, DO, 80 mg at 05/15/20 0856 .  [COMPLETED] clopidogrel (PLAVIX) tablet 300 mg, 300 mg, Oral, Once, 300 mg at 05/06/20 1647 **FOLLOWED BY** clopidogrel (PLAVIX) tablet 75 mg, 75 mg, Oral, Daily, Gaylan Gerold, DO, 75 mg at 05/15/20 0856 .  enoxaparin (LOVENOX) injection 40 mg, 40 mg, Subcutaneous, Q24H, Gaylan Gerold, DO, 40 mg at 05/14/20 2133 .  erythromycin ophthalmic ointment, , Right Eye, Q6H, Iona Beard, MD .  fluconazole (DIFLUCAN) tablet 200 mg, 200 mg, Oral, Daily, Iona Beard, MD, 200 mg at 05/15/20 0856 .  insulin aspart (novoLOG) injection 0-15 Units, 0-15 Units, Subcutaneous, TID WC, Gaylan Gerold, DO, 3 Units at 05/15/20 0856 .  insulin  aspart (novoLOG) injection 0-5 Units, 0-5 Units, Subcutaneous,  QHS, Gaylan Gerold, DO .  insulin glargine (LANTUS) injection 10 Units, 10 Units, Subcutaneous, QHS, Gaylan Gerold, DO, 10 Units at 05/14/20 2133 .  lisinopril (ZESTRIL) tablet 20 mg, 20 mg, Oral, Daily, Iona Beard, MD, 20 mg at 05/15/20 0901 .  ondansetron (ZOFRAN) injection 4 mg, 4 mg, Intravenous, Q6H PRN, Rosalin Hawking, MD, 4 mg at 05/08/20 1312 .  [employer], , Oral, PRN, Rosalin Hawking, MD .  senna-docusate (Senokot-S) tablet 1 tablet, 1 tablet, Oral, QHS PRN, Gaylan Gerold, DO .  sodium chloride flush (NS) 0.9 % injection 3 mL, 3 mL, Intravenous, Once, Gaylan Gerold, DO   Patients Current Diet:     Diet Order                      DIET - DYS 1 Room service appropriate? Yes; Fluid consistency: Pudding Thick  Diet effective now                      Precautions / Restrictions Precautions Precautions: Fall, Other (comment) Precaution Comments: R visual fields deficits, R hemi, aphasia Restrictions Weight Bearing Restrictions: No    Has the patient had 2 or more falls or a fall with injury in the past year?No   Prior Activity Level Community (5-7x/wk): worked full time as Armed forces technical officer. drove and Independent PTA; lived alone   Prior Functional Level Prior Function Level of Independence: Independent Comments: believe he was working, but unsure   Self Care: Did the patient need help bathing, dressing, using the toilet or eating?  Independent   Indoor Mobility: Did the patient need assistance with walking from room to room (with or without device)? Independent   Stairs: Did the patient need assistance with internal or external stairs (with or without device)? Independent   Functional Cognition: Did the patient need help planning regular tasks such as shopping or remembering to take medications? Independent   Home Assistive Devices / Equipment Home Assistive Devices/Equipment: None Home  Equipment: None   Prior Device Use: Indicate devices/aids used by the patient prior to current illness, exacerbation or injury? None of the above   Current Functional Level Cognition   Arousal/Alertness: Awake/alert Overall Cognitive Status: Impaired/Different from baseline Difficult to assess due to: Impaired communication (aphasia) Current Attention Level: Sustained Orientation Level: Oriented to person, Disoriented to situation, Disoriented to time, Disoriented to place Following Commands: Follows one step commands inconsistently, Follows one step commands with increased time Safety/Judgement: Decreased awareness of safety, Decreased awareness of deficits General Comments: pt with more attention to R visual field today (able to find fingers but not able to state number of fingers being held secondary to aphasia) and 25% accuracy of picking up R UE or LE with L UE or LE; pt shows slight impulsiveness with trying to stand Attention: Sustained Sustained Attention: Appears intact Memory:  (to be assessed) Awareness: Impaired Awareness Impairment: Emergent impairment Problem Solving:  (BD) Safety/Judgment: Impaired    Extremity Assessment (includes Sensation/Coordination)   Upper Extremity Assessment: RUE deficits/detail RUE Deficits / Details: R hemiplegia, completely flaccid, does not respond to deep nail bed pressure RUE Sensation: decreased light touch RUE Coordination: decreased fine motor, decreased gross motor  Lower Extremity Assessment: Defer to PT evaluation     ADLs   Overall ADL's : Needs assistance/impaired Eating/Feeding: Maximal assistance Eating/Feeding Details (indicate cue type and reason): Pt with only a small corner of breakfast consumed. needs more assist during feeding during typical day Grooming: Total  assistance Grooming Details (indicate cue type and reason): washed face with wash cloth when it was handed to him Upper Body Dressing : Total assistance Upper  Body Dressing Details (indicate cue type and reason): To don anterior hospital gown in supine Lower Body Dressing: Total assistance Lower Body Dressing Details (indicate cue type and reason): To don footwear lying supine in bed Toilet Transfer: Maximal assistance, +2 for physical assistance, +2 for safety/equipment Toilet Transfer Details (indicate cue type and reason): Simulated with stand-pivot transfer to recliner with Max A +2 and +2 hand-held assist. 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, hemiperesis     Mobility   Overal bed mobility: Needs Assistance Bed Mobility: Supine to Sit Rolling: Min assist Sidelying to sit: Mod assist, HOB elevated Supine to sit: Min assist Sit to supine: Mod assist Sit to sidelying: Min assist General bed mobility comments: min assist needed for supine to sit for hooking L LE under R LE to pull off EOB and trunk elevation     Transfers   Overall transfer level: Needs assistance Equipment used: None Transfer via Lift Equipment: Stedy Transfers: Sit to/from Stand Sit to Stand: From elevated surface, Min assist, +2 physical assistance Stand pivot transfers: Mod assist, From elevated surface General transfer comment: stand without steady was mod assist +2 for trunk elevation and blocking R LE; attempted to side step R and L with max physical assist for weight shifting without ability to move RLE while also blocking the R knee; 2 sets of x5 sit to stands from the stedy with min assist for R hand placement but min guard otherwise     Ambulation / Gait / Stairs / Wheelchair Mobility    Ambulation/Gait Ambulation/Gait assistance: Min guard, Herbalist (Feet): 250 Feet Assistive device: None Gait Pattern/deviations: Step-through pattern, Decreased stride length, Drifts right/left General Gait Details: not attempted Gait velocity: decr     Posture / Balance Dynamic Sitting Balance Sitting balance - Comments: min guard to mod assist for sitting EOB with one posterior LOB Balance Overall balance assessment: Needs assistance Sitting-balance support: Feet supported, Single extremity supported Sitting balance-Leahy Scale: Poor Sitting balance - Comments: min guard to mod assist for sitting EOB with one posterior LOB Postural control: Right lateral lean Standing balance support: Bilateral upper extremity supported, Single extremity supported, During functional activity Standing balance-Leahy Scale: Poor Standing balance comment: pt mod assist +2 to maintain standing without steady while blocking R LE; pt min guard with Stedy to maintain standing, had ability to maintain even weight distribution and verticality without usage of mirror     Special needs/care consideration Continuous Drip IV: no   and Skin: ecchymosis to left arm, excoriated location to back, buttocks (mid, left, right)        Previous Home Environment (from acute therapy documentation) Living Arrangements: Alone  Lives With: Alone Available Help at Discharge: Family Type of Home: Apartment Home Layout: Two level, Bed/bath upstairs Alternate Level Stairs-Number of Steps: townhome style Home Access: Level entry Bathroom Shower/Tub: Chiropodist: Standard Home Care Services: No Additional Comments: ?accuracy of information by patient using yes/no ?'s. Expressive and receptive difficulties   Discharge Living  Setting Plans for Discharge Living Setting: Other (Comment) (will go stay with his son at [place] in his house) Type of Home at Discharge: House Discharge Home Layout:  Multi-level, Able to live on main level with bedroom/bathroom Alternate Level Stairs-Rails: None (NA) Alternate Level Stairs-Number of Steps: NA Discharge Home Access: Level entry Discharge Bathroom Shower/Tub: Tub/shower unit Discharge Bathroom Toilet: Standard Discharge Bathroom Accessibility: Yes How Accessible: Accessible via walker Does the patient have any problems obtaining your medications?: No   Social/Family/Support Systems Patient Roles: Other (Comment) (full time worker; has brother and son) Contact Information: son: Shanon Brow: [phone]; brother Junior: [phone] Anticipated Caregiver: son + brother  Anticipated Caregiver's Contact Information: see above Ability/Limitations of Caregiver: Mod A Caregiver Availability: 24/7 Discharge Plan Discussed with Primary Caregiver: Yes (son) Is Caregiver In Agreement with Plan?: Yes Does Caregiver/Family have Issues with Lodging/Transportation while Pt is in Rehab?: No     Goals Patient/Family Goal for Rehab: PT/OT/SLP: Min/Mod A  Expected length of stay: 18-22 days Pt/Family Agrees to Admission and willing to participate: Yes Program Orientation Provided & Reviewed with Pt/Caregiver Including Roles  & Responsibilities: Yes (pt, son, brother)  Barriers to Discharge:  (none identified. )     Decrease burden of Care through IP rehab admission: NA   Possible need for SNF placement upon discharge:Not anticipated; pt has good social support from his son and brother at [place]. Anticipate pt can reach Min/Mod A level-a level which his family can assist him.      Patient Condition: This patient's medical and functional status has changed since the consult dated: 05/08/20 in which the Rehabilitation Physician determined and documented that the patient's condition is appropriate for intensive rehabilitative care in an inpatient rehabilitation facility. See "History of Present Illness" (above) for medical update. Functional changes are:  worsening functional status from [place] A for ADLs to Max +2 to Total A, and Min G for transfers to Min A +2 in the stedy . Patient's medical and functional status update has been discussed with the Rehabilitation physician and patient remains appropriate for inpatient rehabilitation. Will admit to inpatient rehab today.   Preadmission Screen Completed By:  Raechel Ache, OT, 05/15/2020 12:18 PM ______________________________________________________________________   Discussed status with Dr. Letta Pate on 05/15/20 at 12:13PM and received approval for admission today.   Admission Coordinator:  Raechel Ache, time 12:13PM/Date 05/15/20.             Revision History                                              Note Details  Author Charlett Blake, MD File Time 05/15/2020  1:05 PM  Author Type Physician Status Signed  Last Editor Charlett Blake, MD Service Physical Medicine and Rehabilitation

## 2020-05-15 NOTE — Progress Notes (Signed)
Inpatient Rehabilitation Medication Review by a Pharmacist  A complete drug regimen review was completed for this patient to identify any potential clinically significant medication issues.  Clinically significant medication issues were identified:  no  Check AMION for pharmacist assigned to patient if future medication questions/issues arise during this admission.  Pharmacist comments:   Time spent performing this drug regimen review (minutes):  5   Aaron Weiss August 05/15/2020 3:06 PM

## 2020-05-15 NOTE — Progress Notes (Signed)
Physical Therapy Treatment Patient Details Name: Aaron Weiss MRN: [id number] DOB: 01/09/1966 Today's Date: 05/15/2020    History of Present Illness 54 y.o. male presenting with code stroke. NIHSS 6 with global partial aphasia; CT head chronic left cerebellar infarct, subacute/chronic left superior parietal infarct, subacute left parietal infarct, acute left frontal infarct. 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 Code stroke called again on 11/30 around 8:15am due to new R sided weakness, R gaze and R neglect overnight. CT (+) new developing L MCA CVA. CTA head/neck (+) L M1 proximal occusion. PMHx significant for uncontrolled HTN.     PT Comments    Pt shows increased improvement this session with ability to maintain even LE weight distribution with Stedy and sit to stand with mod assist +2 without the Stedy. Pt showed increased attention to the R visual field and R UE and LE this session, but still has an inability to initiate R UE and LE movement. Pt wasn't able to perform standing weight distribution onto the R LE in order to side step without buckling and needed max assist +2 to even attempt.   Follow Up Recommendations  CIR;Supervision/Assistance - 24 hour     Equipment Recommendations  Wheelchair (measurements PT);Wheelchair cushion (measurements PT);Hospital bed;Other (comment)    Recommendations for Other Services       Precautions / Restrictions Precautions Precautions: Fall;Other (comment) Precaution Comments: R visual fields deficits, R hemi, aphasia Restrictions Weight Bearing Restrictions: No    Mobility  Bed Mobility Overal bed mobility: Needs Assistance Bed Mobility: Supine to Sit     Supine to sit: Min assist     General bed mobility comments: min assist needed for supine to sit for hooking L LE under R LE to pull off EOB and trunk elevation  Transfers   Equipment used: None Transfers: Sit to/from Stand           General transfer comment:  stand without steady was mod assist +2 for trunk elevation and blocking R LE; attempted to side step R and L with max physical assist for weight shifting without ability to move RLE while also blocking the R knee; 2 sets of x5 sit to stands from the stedy with min assist for R hand placement but min guard otherwise  Ambulation/Gait             General Gait Details: not attempted   Stairs             Wheelchair Mobility    Modified Rankin (Stroke Patients Only) Modified Rankin (Stroke Patients Only) Pre-Morbid Rankin Score: No symptoms Modified Rankin: Severe disability     Balance Overall balance assessment: Needs assistance Sitting-balance support: Feet supported;Single extremity supported Sitting balance-Leahy Scale: Poor Sitting balance - Comments: min guard to mod assist for sitting EOB with one posterior LOB   Standing balance support: Bilateral upper extremity supported;Single extremity supported;During functional activity Standing balance-Leahy Scale: Poor Standing balance comment: pt mod assist +2 to maintain standing without steady while blocking R LE; pt min guard with Stedy to maintain standing, had ability to maintain even weight distribution and verticality without usage of mirror                            Cognition Arousal/Alertness: Awake/alert Behavior During Therapy: Flat affect Overall Cognitive Status: Impaired/Different from baseline Area of Impairment: Attention;Safety/judgement;Awareness;Problem solving;Orientation  Orientation Level: Disoriented to;Place;Time;Situation Current Attention Level: Sustained Memory: Decreased short-term memory;Decreased recall of precautions Following Commands: Follows one step commands inconsistently;Follows one step commands with increased time Safety/Judgement: Decreased awareness of safety;Decreased awareness of deficits Awareness: Emergent Problem Solving: Difficulty  sequencing;Requires verbal cues;Requires tactile cues General Comments: pt with more attention to R visual field today (able to find fingers but not able to state number of fingers being held secondary to aphasia) and 25% accuracy of picking up R UE or LE with L UE or LE; pt shows slight impulsiveness with trying to stand      Exercises      General Comments        Pertinent Vitals/Pain Pain Assessment: Faces Faces Pain Scale: No hurt    Home Living                      Prior Function            PT Goals (current goals can now be found in the care plan section) Acute Rehab PT Goals Patient Stated Goal: Unable to state PT Goal Formulation: Patient unable to participate in goal setting Progress towards PT goals: Progressing toward goals    Frequency    Min 4X/week      PT Plan Current plan remains appropriate    Co-evaluation              AM-PAC PT "6 Clicks" Mobility   Outcome Measure  Help needed turning from your back to your side while in a flat bed without using bedrails?: A Little Help needed moving from lying on your back to sitting on the side of a flat bed without using bedrails?: A Little Help needed moving to and from a bed to a chair (including a wheelchair)?: A Lot Help needed standing up from a chair using your arms (e.g., wheelchair or bedside chair)?: A Little Help needed to walk in hospital room?: Total Help needed climbing 3-5 steps with a railing? : Total 6 Click Score: 13    End of Session Equipment Utilized During Treatment: Gait belt Activity Tolerance: Patient tolerated treatment well Patient left: in chair;with call bell/phone within reach;with chair alarm set Nurse Communication: Mobility status;Need for lift equipment PT Visit Diagnosis: Unsteadiness on feet (R26.81);Difficulty in walking, not elsewhere classified (R26.2);Hemiplegia and hemiparesis;Muscle weakness (generalized) (M62.81) Hemiplegia - Right/Left:  Right Hemiplegia - dominant/non-dominant: Dominant Hemiplegia - caused by: Cerebral infarction     Time: 6295-2841 PT Time Calculation (min) (ACUTE ONLY): 31 min  Charges:  $Therapeutic Activity: 8-22 mins $Neuromuscular Re-education: 8-22 mins                     Jeri Cos, SPT [id number]   Nastasha Reising 05/15/2020, 11:45 AM

## 2020-05-15 NOTE — Discharge Summary (Signed)
Name: Aaron Weiss MRN: [id number] DOB: July 17, 1965 54 y.o. PCP: Patient, No Pcp Per  Date of Admission: 05/06/2020  1:02 PM Date of Discharge: 05/15/2020 Attending Physician: Gust Rung, DO  Discharge Diagnosis: 1. 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  Cerebebral embolism with cerebral infarction 3. Stroke 4. Diabetes mellitus 5. Aortic aneurysm   Discharge Medications: Allergies as of 05/15/2020   No Known Allergies     Medication List    STOP taking these medications   ibuprofen 200 MG tablet Commonly known as: ADVIL     TAKE these medications   aspirin 325 MG EC tablet Take 1 tablet (325 mg total) by mouth daily. Start taking on: May 16, 2020 What changed:   medication strength  how much to take  when to take this  reasons to take this  additional instructions   atorvastatin 80 MG tablet Commonly known as: LIPITOR Take 1 tablet (80 mg total) by mouth daily. Start taking on: May 16, 2020   clopidogrel 75 MG tablet Commonly known as: PLAVIX Take 1 tablet (75 mg total) by mouth daily. Start taking on: May 16, 2020   erythromycin ophthalmic ointment Place into the right eye every 6 (six) hours.   fluconazole 200 MG tablet Commonly known as: DIFLUCAN Take 1 tablet (200 mg total) by mouth daily. Start taking on: May 16, 2020   insulin glargine 100 UNIT/ML injection Commonly known as: LANTUS Inject 0.13 mLs (13 Units total) into the skin at bedtime.   lisinopril 20 MG tablet Commonly known as: ZESTRIL Take 1 tablet (20 mg total) by mouth daily. Start taking on: May 16, 2020       Disposition and follow-up:   Aaron Weiss was discharged from [facility] in Stable condition.  At the hospital follow up visit please address:  1.  Follow-up: Diabetes- elevated A1c started on lantus 13 daily, this is a new diagnosis of diabetes.  He will require evaluate glycemic control  and adjust medications.  Patient would benefit from diabetic education in outpatient setting.\ -Continue Lantus 13 units daily -Consider additional diabetic medications in the outpatient setting  Hypertension: Restarted on lisinopril. Patient will need further adjustment of his antihypertensive medications in the outpatient setting. -Continue lisinopril 20 mg -Long-term BP goal of SBP of 1 30-1 50 given left ICA occlusion  Neurology -patient will need 30-day cardiac event monitor to rule atrial fibrillation, continue dysphagia 1 diet, continue dual antiplatelet therapy for a total of 3 months then aspirin alone.  The statin.  Patient will also need a follow-up CTA of his neck in 2 to 3 months.  Patient will likely need a follow-up appointment set up with his PCP once discharged from CIR. -30-day event monitor -Consider statin -2 antiplatelet therapy 3 months and aspirin -Repeat CTA of neck in 2-3 mo  - CIR for rehab  Bacterial conjunctivitis-patient has new onset right eye bacteriology conjunctivitis started on erythromycin with a 7-day course.  Thrush -patient has new onset oral thrush started on fluconazole for a week.  Aortic aneurysmal dilation (4.1 cm) -recommend annual imaging follow-up with CTA or MRA  Hyperlipidemia: -Atorvastatin 80 mg -LDL goal less than 70  2.  Labs / imaging needed at time of follow-up: cbc, bmp  3.  Pending labs/ test needing follow-up: none  Follow-up Appointments:  Follow-up Information    local neurologist. Schedule an appointment as soon as possible for a visit in 4 week(s).  Hospital Course by problem list: 1. 54 y/o ma with hypertension and hyperlipidemia presenting a for altered mental status and aphasia. Found to have L MCA infarct due to left ICA occlusion. No a candidate for tPA due to minimal salvagable tissue as determined via imaging by neurology. Started on asiring and plavix. Continued to have global aphasia  predominantly expressive aphasia and able to follow simple commands. Started on amlodipine and lisinopril after allowing permissive hypertension. On hospital day found have new profound right sided weakness, right sided neglect and right facial droop. CT with evolving left MCA infarcts. CTA showed continued left ICA occlusion and new left M1 occlusion, consistent with extension of his thrombus. Not a candidate for advanced intervention on ICA or MCA occulusion. Aphasia gradually improving with increased vocabulary and ability to follow commands, but does often perseverate and continued to difficulty with naming and reading. Right sided weakness of RUE remained 0/5, RLE improved to 1/5 and facial droop improved. Patient will need 30-day cardiac event monitor to rule atrial fibrillation and atient will also need a follow-up CTA of his neck in 2 to 3 months.  2. Diabetes Found to have A1c of 8.7 started on lantus and SSI during admission. CBGs stable during admission. Will need follow to evaluate glycemic control and started on diabetes regimen and education as outpatient.   3. Hypertension Allowed for permissive hypertension initially before starting on amlodipine and lisinopril. BP with with SBP ranging from 180s to low 100s. Stopped on antihypertensives after found to have new right sided weakness. Restarted on lisinopril after allow for further 48 hours of permissive hypertension in 170s with slow decrease in BP to around 130s.  4. Bacterial conjunctivitis Patient developed purulent drainage from right eye during admission with mild conjunctival injection.  Started on 5 days of erythromycin ointment.   5. Thrush Noted to have white pesudomembrane of tongue and soft palate started on 7 day course of fluconazole.   6. Ascending aortic aneurysmal dilation up to 4.1 cm Follow up outpatient. Recommend annual imaging by CTA or MRA.  Discharge Vitals:   BP 117/90 (BP Location: Left Arm)   Pulse 96    Temp 97.6 F (36.4 C)   Resp 16   Ht 5\' 6"  (1.676 m)   Wt 90 kg   SpO2 94%   BMI 32.02 kg/m   Pertinent Labs, Studies, and Procedures:  CT ANGIO HEAD W OR WO CONTRAST  Result Date: 05/06/2020 CLINICAL DATA:  Stroke/TIA.  Code stroke. EXAM: CT ANGIOGRAPHY HEAD AND NECK CT PERFUSION BRAIN TECHNIQUE: Multidetector CT imaging of the head and neck was performed using the standard protocol during bolus administration of intravenous contrast. Multiplanar CT image reconstructions and MIPs were obtained to evaluate the vascular anatomy. Carotid stenosis measurements (when applicable) are obtained utilizing NASCET criteria, using the distal internal carotid diameter as the denominator. Multiphase CT imaging of the brain was performed following IV bolus contrast injection. Subsequent parametric perfusion maps were calculated using RAPID software. CONTRAST:  40 mL of Omnipaque 350 IV. COMPARISON:  Same day CT head. FINDINGS: CTA NECK FINDINGS Aortic arch: Great vessel origins are patent. Partially imaged ascending aortic aneurysmal dilation up to 4 cm. Right carotid system: No evidence of dissection, stenosis (50% or greater) or occlusion. Left carotid system: The common carotid artery is patent. 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 The internal carotid artery is non-opacified distally within the neck. Vertebral arteries: Right dominant. No evidence of hemodynamically significant stenosis or  occlusion. Skeleton: No acute fracture. Other neck: No mass or suspicious adenopathy. Upper chest: No acute abnormality. Review of the MIP images confirms the above findings CTA HEAD FINDINGS Anterior circulation: Non opacification of the petrous ICA. Faint opacification of the cavernous ICA. Reconstitution at the carotid terminus with opacified left ACA and left MCA proximally. The more distal MCA branches are poorly opacified. The right ICA is patent. The right MCA and bilateral ACA are patent  without hemodynamically significant stenosis. Posterior circulation: Right fetal type PCA. No evidence of hemodynamically significant proximal stenosis or large vessel occlusion. Venous sinuses: As permitted by contrast timing, patent. Review of the MIP images confirms the above findings CT Brain Perfusion Findings: ASPECTS: 5 CBF (<30%) Volume: 24mL Perfusion (Tmax>6.0s) volume: 73 mLmL Mismatch Volume: 49 mLmL Infarction Location:Left anterior (M4) and posterior (M6) MCA territories. IMPRESSION: 1. 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 The left A1 ACA and proximal left MCA are opacified; however, there is poor opacification of more distal left MCA branches. 2. RAPID perfusion calculates a left MCA territory penumbra of 49 mL with 24 mL of core infarct, as detailed above. 3. Partially imaged ascending aortic aneurysmal dilation up to 4.1 cm. Recommend annual imaging followup by CTA or MRA. This recommendation follows 2010 ACCF/AHA/AATS/ACR/ASA/SCA/SCAI/SIR/STS/SVM Guidelines for the Diagnosis and Management of Patients with Thoracic Aortic Disease. Circulation. 2010; 121: Z610-R604: [id number]. Aortic aneurysm NOS (ICD10-I71.9) Code stroke imaging results were communicated on 05/06/2020 at 1:37 pm to provider Dr. Roda ShuttersXu Via telephone, who verbally acknowledged these results. Electronically Signed   By: Feliberto HartsFrederick S Jones MD   On: 05/06/2020 13:48   CT ANGIO NECK W OR WO CONTRAST  Result Date: 05/06/2020 CLINICAL DATA:  Stroke/TIA.  Code stroke. EXAM: CT ANGIOGRAPHY HEAD AND NECK CT PERFUSION BRAIN TECHNIQUE: Multidetector CT imaging of the head and neck was performed using the standard protocol during bolus administration of intravenous contrast. Multiplanar CT image reconstructions and MIPs were obtained to evaluate the vascular anatomy. Carotid stenosis measurements (when applicable) are obtained utilizing NASCET criteria, using the distal internal carotid diameter as the  denominator. Multiphase CT imaging of the brain was performed following IV bolus contrast injection. Subsequent parametric perfusion maps were calculated using RAPID software. CONTRAST:  40 mL of Omnipaque 350 IV. COMPARISON:  Same day CT head. FINDINGS: CTA NECK FINDINGS Aortic arch: Great vessel origins are patent. Partially imaged ascending aortic aneurysmal dilation up to 4 cm. Right carotid system: No evidence of dissection, stenosis (50% or greater) or occlusion. Left carotid system: The common carotid artery is patent. 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 The internal carotid artery is non-opacified distally within the neck. Vertebral arteries: Right dominant. No evidence of hemodynamically significant stenosis or occlusion. Skeleton: No acute fracture. Other neck: No mass or suspicious adenopathy. Upper chest: No acute abnormality. Review of the MIP images confirms the above findings CTA HEAD FINDINGS Anterior circulation: Non opacification of the petrous ICA. Faint opacification of the cavernous ICA. Reconstitution at the carotid terminus with opacified left ACA and left MCA proximally. The more distal MCA branches are poorly opacified. The right ICA is patent. The right MCA and bilateral ACA are patent without hemodynamically significant stenosis. Posterior circulation: Right fetal type PCA. No evidence of hemodynamically significant proximal stenosis or large vessel occlusion. Venous sinuses: As permitted by contrast timing, patent. Review of the MIP images confirms the above findings CT Brain Perfusion Findings: ASPECTS: 5 CBF (<30%) Volume: 24mL Perfusion (  Tmax>6.0s) volume: 73 mLmL Mismatch Volume: 49 mLmL Infarction Location:Left anterior (M4) and posterior (M6) MCA territories. IMPRESSION: 1. 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 The left A1 ACA and proximal left MCA are opacified; however, there is poor opacification of  more distal left MCA branches. 2. RAPID perfusion calculates a left MCA territory penumbra of 49 mL with 24 mL of core infarct, as detailed above. 3. Partially imaged ascending aortic aneurysmal dilation up to 4.1 cm. Recommend annual imaging followup by CTA or MRA. This recommendation follows 2010 ACCF/AHA/AATS/ACR/ASA/SCA/SCAI/SIR/STS/SVM Guidelines for the Diagnosis and Management of Patients with Thoracic Aortic Disease. Circulation. 2010; 121: Z610-R604. Aortic aneurysm NOS (ICD10-I71.9) Code stroke imaging results were communicated on 05/06/2020 at 1:37 pm to provider Dr. Roda Shutters Via telephone, who verbally acknowledged these results. Electronically Signed   By: Feliberto Harts MD   On: 05/06/2020 13:48   MR BRAIN WO CONTRAST  Result Date: 05/06/2020 CLINICAL DATA:  Acute confusion and aphasia. EXAM: MRI HEAD WITHOUT CONTRAST TECHNIQUE: Multiplanar, multiecho pulse sequences of the brain and surrounding structures were obtained without intravenous contrast. COMPARISON:  Head CT, CTA, and CTP 05/06/2020 FINDINGS: Brain: As seen on the prior CTP, there is a large acute left MCA territory involving portions of the left frontal lobe (predominantly operculum), posterior temporal lobe, parietal lobe, lateral occipital lobe, and insula. There is a small region of confluent petechial hemorrhage at the left temporoparietal junction, and cytotoxic edema associated with the infarcts is greatest at the left temporoparietal junction potentially indicating that this region of infarct is slightly older (early subacute) compared to the more anterior infarcts. There is a moderate-sized chronic left cerebellar infarct. A few punctate foci of T2 hyperintensity in the cerebral white matter bilaterally are nonspecific but compatible with minimal chronic small vessel ischemic disease. The ventricles are normal in size. No mass, midline shift, or extra-axial fluid collection is identified. Vascular: Left ICA occlusion as  demonstrated on today's CTA. Susceptibility artifact within the left sylvian fissure corresponding to left M2 and M3 branch occlusions. Skull and upper cervical spine: Unremarkable bone marrow signal. Sinuses/Orbits: Unremarkable orbits. Mild mucosal thickening in the paranasal sinuses. Trace left mastoid fluid. Other: None. IMPRESSION: 1. Large acute to early subacute left MCA infarct. 2. Chronic left cerebellar infarct. Electronically Signed   By: Sebastian Ache M.D.   On: 05/06/2020 15:54   CT CEREBRAL PERFUSION W CONTRAST  Result Date: 05/06/2020 CLINICAL DATA:  Stroke/TIA.  Code stroke. EXAM: CT ANGIOGRAPHY HEAD AND NECK CT PERFUSION BRAIN TECHNIQUE: Multidetector CT imaging of the head and neck was performed using the standard protocol during bolus administration of intravenous contrast. Multiplanar CT image reconstructions and MIPs were obtained to evaluate the vascular anatomy. Carotid stenosis measurements (when applicable) are obtained utilizing NASCET criteria, using the distal internal carotid diameter as the denominator. Multiphase CT imaging of the brain was performed following IV bolus contrast injection. Subsequent parametric perfusion maps were calculated using RAPID software. CONTRAST:  40 mL of Omnipaque 350 IV. COMPARISON:  Same day CT head. FINDINGS: CTA NECK FINDINGS Aortic arch: Great vessel origins are patent. Partially imaged ascending aortic aneurysmal dilation up to 4 cm. Right carotid system: No evidence of dissection, stenosis (50% or greater) or occlusion. Left carotid system: The common carotid artery is patent. 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 The internal carotid artery is non-opacified distally within the neck. Vertebral arteries: Right dominant. No evidence of hemodynamically significant stenosis or occlusion.  Skeleton: No acute fracture. Other neck: No mass or suspicious adenopathy. Upper chest: No acute abnormality. Review of the MIP images  confirms the above findings CTA HEAD FINDINGS Anterior circulation: Non opacification of the petrous ICA. Faint opacification of the cavernous ICA. Reconstitution at the carotid terminus with opacified left ACA and left MCA proximally. The more distal MCA branches are poorly opacified. The right ICA is patent. The right MCA and bilateral ACA are patent without hemodynamically significant stenosis. Posterior circulation: Right fetal type PCA. No evidence of hemodynamically significant proximal stenosis or large vessel occlusion. Venous sinuses: As permitted by contrast timing, patent. Review of the MIP images confirms the above findings CT Brain Perfusion Findings: ASPECTS: 5 CBF (<30%) Volume: 24mL Perfusion (Tmax>6.0s) volume: 73 mLmL Mismatch Volume: 49 mLmL Infarction Location:Left anterior (M4) and posterior (M6) MCA territories. IMPRESSION: 1. 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 The left A1 ACA and proximal left MCA are opacified; however, there is poor opacification of more distal left MCA branches. 2. RAPID perfusion calculates a left MCA territory penumbra of 49 mL with 24 mL of core infarct, as detailed above. 3. Partially imaged ascending aortic aneurysmal dilation up to 4.1 cm. Recommend annual imaging followup by CTA or MRA. This recommendation follows 2010 ACCF/AHA/AATS/ACR/ASA/SCA/SCAI/SIR/STS/SVM Guidelines for the Diagnosis and Management of Patients with Thoracic Aortic Disease. Circulation. 2010; 121: Z610-R604. Aortic aneurysm NOS (ICD10-I71.9) Code stroke imaging results were communicated on 05/06/2020 at 1:37 pm to provider Dr. Roda Shutters Via telephone, who verbally acknowledged these results. Electronically Signed   By: Feliberto Harts MD   On: 05/06/2020 13:48   ECHOCARDIOGRAM COMPLETE  Result Date: 05/07/2020    ECHOCARDIOGRAM REPORT   Patient Name:   Aaron Weiss Date of Exam: 05/07/2020 Medical Rec #:  [id number]         Height:  Accession #:    [id number]        Weight:       213.2 lb Date of Birth:  Sep 17, 1965         BSA:          2.107 m Patient Age:    54 years          BP:           183/83 mmHg Patient Gender: M                 HR:           61 bpm. Exam Location:  Inpatient Procedure: 2D Echo, Cardiac Doppler and Color Doppler Indications:    Stroke 434.91 / I163.9  History:        Patient has no prior history of Echocardiogram examinations.                 Risk Factors:Diabetes. Carotid artery occlusion with cerebral                 infarction.  Sonographer:    Celesta Gentile RCS Referring Phys: [id number] CHRISTOPHER J TEGELER IMPRESSIONS  1. Left ventricular ejection fraction, by estimation, is 60 to 65%. The left ventricle has normal function. The left ventricle has no regional wall motion abnormalities. There is mild left ventricular hypertrophy. Left ventricular diastolic parameters are consistent with Grade I diastolic dysfunction (impaired relaxation).  2. Right ventricular systolic function is normal. The right ventricular size is normal.  3. The mitral valve is grossly normal. No evidence of mitral valve regurgitation.  4. The aortic valve  is tricuspid. Aortic valve regurgitation is not visualized.  5. The inferior vena cava is normal in size with greater than 50% respiratory variability, suggesting right atrial pressure of 3 mmHg. Comparison(s): No prior Echocardiogram. FINDINGS  Left Ventricle: Left ventricular ejection fraction, by estimation, is 60 to 65%. The left ventricle has normal function. The left ventricle has no regional wall motion abnormalities. The left ventricular internal cavity size was normal in size. There is  mild left ventricular hypertrophy. Left ventricular diastolic parameters are consistent with Grade I diastolic dysfunction (impaired relaxation). Indeterminate filling pressures. Right Ventricle: The right ventricular size is normal. No increase in right ventricular wall thickness. Right ventricular  systolic function is normal. Left Atrium: Left atrial size was normal in size. Right Atrium: Right atrial size was normal in size. Pericardium: There is no evidence of pericardial effusion. Mitral Valve: The mitral valve is grossly normal. No evidence of mitral valve regurgitation. Tricuspid Valve: The tricuspid valve is grossly normal. Tricuspid valve regurgitation is trivial. Aortic Valve: The aortic valve is tricuspid. Aortic valve regurgitation is not visualized. Pulmonic Valve: The pulmonic valve was normal in structure. Pulmonic valve regurgitation is not visualized. Aorta: The aortic root and ascending aorta are structurally normal, with no evidence of dilitation. Venous: The inferior vena cava is normal in size with greater than 50% respiratory variability, suggesting right atrial pressure of 3 mmHg. IAS/Shunts: No atrial level shunt detected by color flow Doppler.  LEFT VENTRICLE PLAX 2D LVIDd:         5.30 cm  Diastology LVIDs:         3.80 cm  LV e' medial:    5.66 cm/s LV PW:         1.20 cm  LV E/e' medial:  10.6 LV IVS:        1.10 cm  LV e' lateral:   7.94 cm/s LVOT diam:     2.00 cm  LV E/e' lateral: 7.6 LV SV:         47 LV SV Index:   23 LVOT Area:     3.14 cm  RIGHT VENTRICLE RV S prime:     14.60 cm/s TAPSE (M-mode): 2.3 cm LEFT ATRIUM             Index       RIGHT ATRIUM           Index LA diam:        4.40 cm 2.09 cm/m  RA Area:     12.00 cm LA Vol (A2C):   44.5 ml 21.12 ml/m RA Volume:   24.70 ml  11.72 ml/m LA Vol (A4C):   48.4 ml 22.97 ml/m LA Biplane Vol: 53.4 ml 25.34 ml/m  AORTIC VALVE LVOT Vmax:   81.50 cm/s LVOT Vmean:  54.700 cm/s LVOT VTI:    0.151 m  AORTA Ao Root diam: 3.20 cm MITRAL VALVE MV Area (PHT): 2.80 cm    SHUNTS MV Decel Time: 271 msec    Systemic VTI:  0.15 m MV E velocity: 60.00 cm/s  Systemic Diam: 2.00 cm MV A velocity: 72.50 cm/s MV E/A ratio:  0.83 Zoila Shutter MD Electronically signed by Zoila Shutter MD Signature Date/Time: 05/07/2020/4:17:19 PM    Final     CT HEAD CODE STROKE WO CONTRAST  Result Date: 05/06/2020 CLINICAL DATA:  Code stroke. Acute stroke suspected. Neuro deficit. EXAM: CT HEAD WITHOUT CONTRAST TECHNIQUE: Contiguous axial images were obtained from the base of the skull through the vertex without  intravenous contrast. COMPARISON:  None. FINDINGS: Brain: Abnormal hypodensity and loss of gray-white differentiation involving the left MCA territory, including the insula and frontoparietal cortex. The more frontoparietal area of hypodensity is more hypodense and may be more subacute in chronicity. Mild associated sulcal effacement without midline shift. No evidence of acute hemorrhage. Remote left cerebellar infarct. Vascular: No definite hyperdense vessel. Skull: No acute fracture. Sinuses/Orbits: No acute findings. Other: No mastoid effusions. ASPECTS [facility] Stroke Program Early CT Score) - Ganglionic level infarction (caudate, lentiform nuclei, internal capsule, insula, M1-M3 cortex): 4 - Supraganglionic infarction (M4-M6 cortex): 1 Total score (0-10 with 10 being normal): 5 IMPRESSION: 1. Findings concerning for acute left MCA territory infarct, as detailed above.ASPECTS is 5. 2. This includes an area of hypodensity in the posterior/distal left MCA territory that is more hypodense and may be more subacute in chronicity. 3. No acute hemorrhage. 4. Remote left cerebellar infarct. Code stroke imaging results were communicated on 05/06/2020 at 1:20 pm to provider Dr. Roda Shutters Via telephone. Electronically Signed   By: Feliberto Harts MD   On: 05/06/2020 13:24    Discharge Instructions: Discharge Instructions    Diet - low sodium heart healthy   Complete by: As directed    Discharge instructions   Complete by: As directed    You were hospitalized for stroke. Thank you for allowing [place] to be part of your care.   You will need to establish care with a local neurologist for stroke and for follow up imaging.  Please note these changes made to your  medications:   - Medications to continue: Continue your home medications  - Medications to start: Lantus 13 units nightly, lisinopril, clopidogrel for 3 months, aspirin, atorvastatin   Please call our clinic if you have any questions or concerns, we may be able to help and keep you from a long and expensive emergency room wait. Our clinic and after hours phone number is [phone], the best time to call is Monday through Friday 9 am to 4 pm but there is always someone available 24/7 if you have an emergency. If you need medication refills please notify your pharmacy one week in advance and they will send [place] a request.   Increase activity slowly   Complete by: As directed       Signed: Quincy Simmonds, MD 05/15/2020, 2:38 PM   Pager: [phone]

## 2020-05-15 NOTE — TOC Progression Note (Signed)
Transition of Care [facility]) - Progression Note    Patient Details  Name: Aaron Weiss MRN: [id number] Date of Birth: Oct 25, 1965  Transition of Care [facility]) CM/SW Contact  Lorri Frederick, LCSW Phone Number: 05/15/2020, 9:12 AM  Clinical Narrative:   CSW called, LM, with [facility] asking for update on referral.         Expected Discharge Plan and Services                                                 Social Determinants of Health (SDOH) Interventions    Readmission Risk Interventions No flowsheet data found.

## 2020-05-15 NOTE — Progress Notes (Signed)
HD#9 Subjective:  Overnight Events: No events overnight  Patient doing well this morning, continues to have drainage from his eye. Otherwise denies new complaints. Discussed plan for him to go to CIR which he seems agreeable to.    Objective:  Vital signs in last 24 hours: Vitals:   05/14/20 0532 05/14/20 1406 05/14/20 2329 05/15/20 0531  BP: 136/88 (!) 128/93 125/87 (!) 135/94  Pulse: 71 93 73 87  Resp: 18 18 20 16   Temp: 98.6 F (37 C) 97.9 F (36.6 C) 97.9 F (36.6 C) 98.9 F (37.2 C)  TempSrc: Oral  Oral Oral  SpO2: 95% 98% 93% 94%  Weight:      Height:       Supplemental O2: Room Air SpO2: 97 % O2 Flow Rate (L/min): 0   Physical Exam:  Physical Exam Constitutional:      Appearance: Normal appearance.  HENT:     Head: Normocephalic and atraumatic.  Eyes:     Pupils: Pupils are equal, round, and reactive to light.     Comments: Patient with purulent drainage of the right eye with mild conjunctival injection no pain with eye movements, significant right-sided neglect limiting right sided eye movements.  Cardiovascular:     Rate and Rhythm: Normal rate.     Pulses: Normal pulses.     Heart sounds: Normal heart sounds. No murmur heard.   Pulmonary:     Effort: Pulmonary effort is normal.     Breath sounds: Normal breath sounds.  Abdominal:     General: Abdomen is flat.     Palpations: Abdomen is soft.     Tenderness: There is no abdominal tenderness.  Musculoskeletal:        General: Normal range of motion.     Cervical back: Normal range of motion.     Right lower leg: No edema.     Left lower leg: No edema.  Skin:    General: Skin is warm and dry.  Neurological:     Mental Status: He is alert.     Cranial Nerves: Dysarthria present.     Motor: Weakness: RUE 0/5 and RLE 1/5      Coordination: Coordination abnormal.     Comments: Progressively improving but significant aphasia with perseveration.  Motor strength 1/5 RLE, 1/5 RUE right-sided facial  droop minimal dysarthria and right-sided neglect  Psychiatric:        Mood and Affect: Mood normal.     Filed Weights   05/06/20 1300 05/08/20 0935 05/10/20 1000  Weight: 96.7 kg 95.2 kg 90 kg     Intake/Output Summary (Last 24 hours) at 05/15/2020 0544 Last data filed at 05/14/2020 2100 Gross per 24 hour  Intake --  Output 575 ml  Net -575 ml   Net IO Since Admission: -3,515.18 mL [05/15/20 0544]  Pertinent Labs: CBC Latest Ref Rng & Units 05/11/2020 05/10/2020 05/09/2020  WBC 4.0 - 10.5 K/uL 13.5(H) 10.6(H) 13.1(H)  Hemoglobin 13.0 - 17.0 g/dL 05/11/2020 89.2 11.9  Hematocrit 39 - 52 % 43.0 42.8 46.7  Platelets 150 - 400 K/uL 249 240 279    CMP Latest Ref Rng & Units 05/15/2020 05/11/2020 05/10/2020  Glucose 70 - 99 mg/dL 14/06/2019) 408(X) 448(J)  BUN 6 - 20 mg/dL 856(D) 18 18  Creatinine 0.61 - 1.24 mg/dL 14(H 7.02 6.37  Sodium 135 - 145 mmol/L 142 138 136  Potassium 3.5 - 5.1 mmol/L 4.5 3.2(L) 3.4(L)  Chloride 98 - 111 mmol/L 105 103 101  CO2 22 - 32 mmol/L 24 21(L) 23  Calcium 8.9 - 10.3 mg/dL 9.7 9.4 8.9  Total Protein 6.5 - 8.1 g/dL - - -  Total Bilirubin 0.3 - 1.2 mg/dL - - -  Alkaline Phos 38 - 126 U/L - - -  AST 15 - 41 U/L - - -  ALT 0 - 44 U/L - - -    Imaging: No results found.  Assessment/Plan:   Principal Problem:   Carotid artery occlusion with cerebral infarction [facility]) Active Problems:   Cerebral embolism with cerebral infarction   Aortic aneurysm (HCC)   CVA (cerebral vascular accident) (HCC)   Diabetes mellitus, type 2 (HCC)   Altered mental status   Patient Summary: Aaron Weiss a 65 yom with hypertension, hyperlipidemia, obesity, and prior CVA who presented to [facility] with expressive aphasia and found to have left MCA infarct complicated by progression of MCA infarct and new ICA thrombosis.  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 Partial global aphasia History of cerebellar CVA Continues to have significant  expressive aphasia, right-sided neglect, and right-sided upper and lower extremity weakness. Currently working on inpatient rehab placement in [place], may consider inpatient rehab here if unable to find placement in [place] soon.  -Continue telemetry -Continue dual antiplatelet therapy with ASA 325 and Plavix 75 mg for 3 months then aspirin alone -Continue atorvastatin 80 mg, goal LDL less than 70 -Slowly restart hypertension medications, currently on lisinopril 20 mg -Patient will need a follow-up CT in 2 to 3 months to assess his left ICA -Currently on dysphagia 1 diet -Continue PT/OT daily  - Accepted at CIR with bed available today  Conjunctivitis  Patient with new purulent drainage from right eye this morning with mild conjunctival injection concerning for bacterial vs viral conjunctivitis. Started on topical erythromycin.  Oral candidiasis Noted with white pseudomembranes on tongue and soft palate. Started on oral fluconazole 200mg  daily due to dysphagia.   Diabetes Patient's diabetes is well controlled on Lantus 10 units and sliding scale insulin. Last hemoglobin A1c of 8.7 and this morning's fasting glucose of 155. Prandial glucose elevated to around 200 will increase lantus -Lantus 13 units daily and SSI  Hyperlipidemia  LDL 122 -Continue atorvastatin80 mg. Goal LDL < 70  Hypertension Patient's blood pressure today is 136/97 on lisinopril 20 mg. We will continue to hold amlodipine.   Ascending aortic aneurysmal dilation up to 4.1 cm Follow up outpatient. Recommend annual imaging by CTA or MRA, per neuro.  Diet: Dysphagia 1 IVF: None,None VTE: Enoxaparin Code: Full PT/OT recs: CIR, none. TOC recs: Pending CRR placement in   Dispo: Anticipated discharge to Inpatient rehab today.  [place], MD 05/15/20 5:44 AM Pager: [phone]  Please contact the on call pager after 5 pm and on weekends at [phone].

## 2020-05-15 NOTE — Plan of Care (Signed)
  Problem: Education: Goal: Knowledge of General Education information will improve Description Including pain rating scale, medication(s)/side effects and non-pharmacologic comfort measures Outcome: Progressing   

## 2020-05-15 NOTE — Progress Notes (Signed)
Inpatient Rehabilitation-Admissions Coordinator   Received medical clearance for admit to CIR today. Pt's insurance [employer] has been re-reviewed and approved. Pt and his son notified of bed offer and they have accepted. Reviewed insurance benefits letter and consent form. All questions answered. RN and [facility] team notified of plan for admit today.   Please call if questions.   Cheri Rous, OTR/L  Rehab Admissions Coordinator  [phone] 05/15/2020 12:20 PM

## 2020-05-15 NOTE — Progress Notes (Signed)
Aaron Weiss, Krutika P, MD  Physician  Physical Medicine and Rehabilitation  Consult Note      Signed  Date of Service:  05/08/2020  5:58 AM      Related encounter: ED to Hosp-Admission (Current) [facility]      Signed      Expand All Collapse All           Physical Medicine and Rehabilitation Consult Reason for Consult: Headache with aphasia Referring Physician: [facility]   HPI: Aaron Weiss is a 54 y.o. right-handed male with history of hypertension but not seeing a doctor for years.  Per chart review lives with brother and son.  Reportedly independent prior to admission.  Two-level home bed and bath upstairs.  Presented 05/06/2020 with headache and altered mental status/aphasia.  Blood pressure 150s/90, glucose 258.  Cranial CT scan findings concerning for acute left MCA territory infarction.  No acute hemorrhage.  Remote left cerebellar infarct.  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  Patient did not receive TPA.  CT cerebral perfusion scan partially imaged ascending aortic aneurysmal dilation up to 4.1 cm.  Recommendations for annual imaging followed by CTA or MRA.  MRI follow-up showed large acute early subacute left MCA infarction.  Echocardiogram with ejection fraction of 60 to 65% no wall motion abnormalities grade 1 diastolic dysfunction.  Admission chemistries glucose 261, hemoglobin A1c 8.7, urine drug screen negative.  Currently maintained on aspirin 325 mg and Plavix for CVA prophylaxis x3 months then aspirin alone.  Subcutaneous Lovenox for DVT prophylaxis.  Tolerating a regular consistency diet.  Therapy evaluations completed with recommendations of physical medicine rehab consult.   Review of Systems  Constitutional: Negative for chills and fever.  HENT: Negative for hearing loss.   Eyes: Negative for blurred vision and double vision.  Respiratory:  Negative for cough and shortness of breath.   Cardiovascular: Negative for chest pain, palpitations and leg swelling.  Gastrointestinal: Positive for constipation. Negative for heartburn, nausea and vomiting.  Genitourinary: Negative for dysuria and hematuria.  Musculoskeletal: Positive for myalgias.  Skin: Negative for rash.  Neurological: Positive for speech change, weakness and headaches.  All other systems reviewed and are negative.   History reviewed. No pertinent past medical history. History reviewed. No pertinent surgical history. No family history on file. Social History:  has no history on file for tobacco use, alcohol use, and drug use. Allergies: No Known Allergies       Medications Prior to Admission  Medication Sig Dispense Refill  . aspirin EC 81 MG tablet Take 81-162 mg by mouth as needed for mild pain (or headaches). Swallow whole.      . ibuprofen (ADVIL) 200 MG tablet Take 200-400 mg by mouth every 6 (six) hours as needed for mild pain (or headaches).          Home: Home Living Family/patient expects to be discharged to:: Private residence Living Arrangements: Other (Comment) (pt not able to state) Available Help at Discharge: Family Type of Home: Apartment Home Access: Level entry Home Layout: Two level, Bed/bath upstairs Alternate Level Stairs-Number of Steps: townhome style Bathroom Shower/Tub: Engineer, manufacturing systemsTub/shower unit Bathroom Toilet: Standard Home Equipment: None Additional Comments: ?accuracy of information by patient using yes/no ?'s. Expressive and receptive difficulties  Functional History: Prior Function Level of Independence: Independent Comments: believe he was working, but unsure Functional Status:  Mobility: Bed Mobility Overal bed mobility:  Needs Assistance Bed Mobility: Supine to Sit, Sit to Supine Supine to sit: Min guard Sit to supine: Min guard General bed mobility comments: moved impulsively supine to sit (guarding for safety); sit to supine  nearly needing assist to raise legs (after vomiting) Transfers Overall transfer level: Needs assistance Equipment used: None Transfers: Sit to/from Stand Sit to Stand: Min guard General transfer comment: impulsively coming to stand; wide BOS; no imbalance Ambulation/Gait General Gait Details: unable to assess due to vomiting and elevated BP 193/122   ADL: ADL Overall ADL's : Needs assistance/impaired Eating/Feeding: Set up, Sitting Grooming: Set up, Sitting Grooming Details (indicate cue type and reason): washed face with wash cloth when it was handed to him Toilet Transfer: Minimal assistance, Tax adviser Details (indicate cue type and reason): min A for steadying support General ADL Comments: full ADL assessment limited due to pt beginning to vomit when standing at start of session. Pt with little awarenss of this vomiting, not able to communicate appropriately to OT he was feeling ill   Cognition: Cognition Overall Cognitive Status: Difficult to assess Orientation Level: Disoriented X4 Cognition Arousal/Alertness: Awake/alert Behavior During Therapy: WFL for tasks assessed/performed Overall Cognitive Status: Difficult to assess Area of Impairment: Problem solving, Safety/judgement Safety/Judgement: Decreased awareness of deficits Problem Solving: Slow processing, Requires verbal cues (repetition) General Comments: difficult to fully assess 2/2 language deficits. Pt followed ~75% of simple verbal commands. Yes/no questions not always reliable. Showed no awareness of having to vomit despite gagging in standing Difficult to assess due to: Impaired communication (global aphasia)   Blood pressure (!) 142/88, pulse 65, temperature 98.2 F (36.8 C), temperature source Oral, resp. rate 18, weight 96.7 kg, SpO2 98 %. Physical Exam   General: Alert and oriented x 3, No apparent distress HEENT: Head is normocephalic, atraumatic, PERRLA, EOMI, sclera anicteric, oral  mucosa pink and moist, dentition intact, ext ear canals clear,  Neck: Supple without JVD or lymphadenopathy Heart: Reg rate and rhythm. No murmurs rubs or gallops Chest: CTA bilaterally without wheezes, rales, or rhonchi; no distress Abdomen: Soft, non-tender, non-distended, bowel sounds positive. Extremities: No clubbing, cyanosis, or edema. Pulses are 2+ Skin: Clean and intact without signs of breakdown Neuro: Patient is a bit lethargic but arousable.  Global aphasia.  Inconsistent to follow commands but moves all extremities antigravity. Responds to all questions with "1996, 2001" but says thank you at end of exam Psych: Pt's affect is appropriate. Pt is cooperative    Lab Results Last 24 Hours       Results for orders placed or performed during the hospital encounter of 05/06/20 (from the past 24 hour(s))  Glucose, capillary     Status: Abnormal    Collection Time: 05/07/20  8:52 AM  Result Value Ref Range    Glucose-Capillary 209 (H) 70 - 99 mg/dL  Glucose, capillary     Status: Abnormal    Collection Time: 05/07/20 12:14 PM  Result Value Ref Range    Glucose-Capillary 167 (H) 70 - 99 mg/dL  Glucose, capillary     Status: Abnormal    Collection Time: 05/07/20  6:11 PM  Result Value Ref Range    Glucose-Capillary 169 (H) 70 - 99 mg/dL  Rapid urine drug screen (hospital performed)     Status: None    Collection Time: 05/07/20  6:50 PM  Result Value Ref Range    Opiates NONE DETECTED NONE DETECTED    Cocaine NONE DETECTED NONE DETECTED    Benzodiazepines NONE DETECTED NONE  DETECTED    Amphetamines NONE DETECTED NONE DETECTED    Tetrahydrocannabinol NONE DETECTED NONE DETECTED    Barbiturates NONE DETECTED NONE DETECTED  Glucose, capillary     Status: Abnormal    Collection Time: 05/07/20  9:38 PM  Result Value Ref Range    Glucose-Capillary 196 (H) 70 - 99 mg/dL  CBC     Status: Abnormal    Collection Time: 05/08/20  1:11 AM  Result Value Ref Range    WBC 12.0 (H) 4.0 -  10.5 K/uL    RBC 5.17 4.22 - 5.81 MIL/uL    Hemoglobin 15.8 13.0 - 17.0 g/dL    HCT 46.9 39 - 52 %    MCV 87.6 80.0 - 100.0 fL    MCH 30.6 26.0 - 34.0 pg    MCHC 34.9 30.0 - 36.0 g/dL    RDW 62.9 52.8 - 41.3 %    Platelets 270 150 - 400 K/uL    nRBC 0.0 0.0 - 0.2 %  Basic metabolic panel     Status: Abnormal    Collection Time: 05/08/20  1:11 AM  Result Value Ref Range    Sodium 132 (L) 135 - 145 mmol/L    Potassium 3.6 3.5 - 5.1 mmol/L    Chloride 97 (L) 98 - 111 mmol/L    CO2 23 22 - 32 mmol/L    Glucose, Bld 198 (H) 70 - 99 mg/dL    BUN 10 6 - 20 mg/dL    Creatinine, Ser 2.44 0.61 - 1.24 mg/dL    Calcium 9.1 8.9 - 01.0 mg/dL    GFR, Estimated >27 >25 mL/min    Anion gap 12 5 - 15  TSH     Status: None    Collection Time: 05/08/20  1:11 AM  Result Value Ref Range    TSH 1.618 0.350 - 4.500 uIU/mL  Vitamin B12     Status: None    Collection Time: 05/08/20  1:11 AM  Result Value Ref Range    Vitamin B-12 211 180 - 914 pg/mL       Imaging Results (Last 48 hours)  CT ANGIO HEAD W OR WO CONTRAST   Result Date: 05/06/2020 CLINICAL DATA:  Stroke/TIA.  Code stroke. EXAM: CT ANGIOGRAPHY HEAD AND NECK CT PERFUSION BRAIN TECHNIQUE: Multidetector CT imaging of the head and neck was performed using the standard protocol during bolus administration of intravenous contrast. Multiplanar CT image reconstructions and MIPs were obtained to evaluate the vascular anatomy. Carotid stenosis measurements (when applicable) are obtained utilizing NASCET criteria, using the distal internal carotid diameter as the denominator. Multiphase CT imaging of the brain was performed following IV bolus contrast injection. Subsequent parametric perfusion maps were calculated using RAPID software. CONTRAST:  40 mL of Omnipaque 350 IV. COMPARISON:  Same day CT head. FINDINGS: CTA NECK FINDINGS Aortic arch: Great vessel origins are patent. Partially imaged ascending aortic aneurysmal dilation up to 4 cm. Right  carotid system: No evidence of dissection, stenosis (50% or greater) or occlusion. Left carotid system: The common carotid artery is patent. 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 The internal carotid artery is non-opacified distally within the neck. Vertebral arteries: Right dominant. No evidence of hemodynamically significant stenosis or occlusion. Skeleton: No acute fracture. Other neck: No mass or suspicious adenopathy. Upper chest: No acute abnormality. Review of the MIP images confirms the above findings CTA HEAD FINDINGS Anterior circulation: Non opacification of the petrous ICA. Faint opacification of the cavernous  ICA. Reconstitution at the carotid terminus with opacified left ACA and left MCA proximally. The more distal MCA branches are poorly opacified. The right ICA is patent. The right MCA and bilateral ACA are patent without hemodynamically significant stenosis. Posterior circulation: Right fetal type PCA. No evidence of hemodynamically significant proximal stenosis or large vessel occlusion. Venous sinuses: As permitted by contrast timing, patent. Review of the MIP images confirms the above findings CT Brain Perfusion Findings: ASPECTS: 5 CBF (<30%) Volume: 24mL Perfusion (Tmax>6.0s) volume: 73 mLmL Mismatch Volume: 49 mLmL Infarction Location:Left anterior (M4) and posterior (M6) MCA territories. IMPRESSION: 1. 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 The left A1 ACA and proximal left MCA are opacified; however, there is poor opacification of more distal left MCA branches. 2. RAPID perfusion calculates a left MCA territory penumbra of 49 mL with 24 mL of core infarct, as detailed above. 3. Partially imaged ascending aortic aneurysmal dilation up to 4.1 cm. Recommend annual imaging followup by CTA or MRA. This recommendation follows 2010 ACCF/AHA/AATS/ACR/ASA/SCA/SCAI/SIR/STS/SVM Guidelines for the Diagnosis and Management of  Patients with Thoracic Aortic Disease. Circulation. 2010; 121: Z610-R604. Aortic aneurysm NOS (ICD10-I71.9) Code stroke imaging results were communicated on 05/06/2020 at 1:37 pm to provider Dr. Roda Shutters Via telephone, who verbally acknowledged these results. Electronically Signed   By: Feliberto Harts MD   On: 05/06/2020 13:48    CT ANGIO NECK W OR WO CONTRAST   Result Date: 05/06/2020 CLINICAL DATA:  Stroke/TIA.  Code stroke. EXAM: CT ANGIOGRAPHY HEAD AND NECK CT PERFUSION BRAIN TECHNIQUE: Multidetector CT imaging of the head and neck was performed using the standard protocol during bolus administration of intravenous contrast. Multiplanar CT image reconstructions and MIPs were obtained to evaluate the vascular anatomy. Carotid stenosis measurements (when applicable) are obtained utilizing NASCET criteria, using the distal internal carotid diameter as the denominator. Multiphase CT imaging of the brain was performed following IV bolus contrast injection. Subsequent parametric perfusion maps were calculated using RAPID software. CONTRAST:  40 mL of Omnipaque 350 IV. COMPARISON:  Same day CT head. FINDINGS: CTA NECK FINDINGS Aortic arch: Great vessel origins are patent. Partially imaged ascending aortic aneurysmal dilation up to 4 cm. Right carotid system: No evidence of dissection, stenosis (50% or greater) or occlusion. Left carotid system: The common carotid artery is patent. 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 The internal carotid artery is non-opacified distally within the neck. Vertebral arteries: Right dominant. No evidence of hemodynamically significant stenosis or occlusion. Skeleton: No acute fracture. Other neck: No mass or suspicious adenopathy. Upper chest: No acute abnormality. Review of the MIP images confirms the above findings CTA HEAD FINDINGS Anterior circulation: Non opacification of the petrous ICA. Faint opacification of the cavernous ICA. Reconstitution at the  carotid terminus with opacified left ACA and left MCA proximally. The more distal MCA branches are poorly opacified. The right ICA is patent. The right MCA and bilateral ACA are patent without hemodynamically significant stenosis. Posterior circulation: Right fetal type PCA. No evidence of hemodynamically significant proximal stenosis or large vessel occlusion. Venous sinuses: As permitted by contrast timing, patent. Review of the MIP images confirms the above findings CT Brain Perfusion Findings: ASPECTS: 5 CBF (<30%) Volume: 24mL Perfusion (Tmax>6.0s) volume: 73 mLmL Mismatch Volume: 49 mLmL Infarction Location:Left anterior (M4) and posterior (M6) MCA territories. IMPRESSION: 1. 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 The left A1 ACA and proximal left MCA  are opacified; however, there is poor opacification of more distal left MCA branches. 2. RAPID perfusion calculates a left MCA territory penumbra of 49 mL with 24 mL of core infarct, as detailed above. 3. Partially imaged ascending aortic aneurysmal dilation up to 4.1 cm. Recommend annual imaging followup by CTA or MRA. This recommendation follows 2010 ACCF/AHA/AATS/ACR/ASA/SCA/SCAI/SIR/STS/SVM Guidelines for the Diagnosis and Management of Patients with Thoracic Aortic Disease. Circulation. 2010; 121: W098-J191. Aortic aneurysm NOS (ICD10-I71.9) Code stroke imaging results were communicated on 05/06/2020 at 1:37 pm to provider Dr. Roda Shutters Via telephone, who verbally acknowledged these results. Electronically Signed   By: Feliberto Harts MD   On: 05/06/2020 13:48    MR BRAIN WO CONTRAST   Result Date: 05/06/2020 CLINICAL DATA:  Acute confusion and aphasia. EXAM: MRI HEAD WITHOUT CONTRAST TECHNIQUE: Multiplanar, multiecho pulse sequences of the brain and surrounding structures were obtained without intravenous contrast. COMPARISON:  Head CT, CTA, and CTP 05/06/2020 FINDINGS: Brain: As seen on the prior CTP, there  is a large acute left MCA territory involving portions of the left frontal lobe (predominantly operculum), posterior temporal lobe, parietal lobe, lateral occipital lobe, and insula. There is a small region of confluent petechial hemorrhage at the left temporoparietal junction, and cytotoxic edema associated with the infarcts is greatest at the left temporoparietal junction potentially indicating that this region of infarct is slightly older (early subacute) compared to the more anterior infarcts. There is a moderate-sized chronic left cerebellar infarct. A few punctate foci of T2 hyperintensity in the cerebral white matter bilaterally are nonspecific but compatible with minimal chronic small vessel ischemic disease. The ventricles are normal in size. No mass, midline shift, or extra-axial fluid collection is identified. Vascular: Left ICA occlusion as demonstrated on today's CTA. Susceptibility artifact within the left sylvian fissure corresponding to left M2 and M3 branch occlusions. Skull and upper cervical spine: Unremarkable bone marrow signal. Sinuses/Orbits: Unremarkable orbits. Mild mucosal thickening in the paranasal sinuses. Trace left mastoid fluid. Other: None. IMPRESSION: 1. Large acute to early subacute left MCA infarct. 2. Chronic left cerebellar infarct. Electronically Signed   By: Sebastian Ache M.D.   On: 05/06/2020 15:54    CT CEREBRAL PERFUSION W CONTRAST   Result Date: 05/06/2020 CLINICAL DATA:  Stroke/TIA.  Code stroke. EXAM: CT ANGIOGRAPHY HEAD AND NECK CT PERFUSION BRAIN TECHNIQUE: Multidetector CT imaging of the head and neck was performed using the standard protocol during bolus administration of intravenous contrast. Multiplanar CT image reconstructions and MIPs were obtained to evaluate the vascular anatomy. Carotid stenosis measurements (when applicable) are obtained utilizing NASCET criteria, using the distal internal carotid diameter as the denominator. Multiphase CT imaging of the  brain was performed following IV bolus contrast injection. Subsequent parametric perfusion maps were calculated using RAPID software. CONTRAST:  40 mL of Omnipaque 350 IV. COMPARISON:  Same day CT head. FINDINGS: CTA NECK FINDINGS Aortic arch: Great vessel origins are patent. Partially imaged ascending aortic aneurysmal dilation up to 4 cm. Right carotid system: No evidence of dissection, stenosis (50% or greater) or occlusion. Left carotid system: The common carotid artery is patent. 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 The internal carotid artery is non-opacified distally within the neck. Vertebral arteries: Right dominant. No evidence of hemodynamically significant stenosis or occlusion. Skeleton: No acute fracture. Other neck: No mass or suspicious adenopathy. Upper chest: No acute abnormality. Review of the MIP images confirms the above findings CTA HEAD FINDINGS Anterior circulation: Non opacification of the petrous ICA.  Faint opacification of the cavernous ICA. Reconstitution at the carotid terminus with opacified left ACA and left MCA proximally. The more distal MCA branches are poorly opacified. The right ICA is patent. The right MCA and bilateral ACA are patent without hemodynamically significant stenosis. Posterior circulation: Right fetal type PCA. No evidence of hemodynamically significant proximal stenosis or large vessel occlusion. Venous sinuses: As permitted by contrast timing, patent. Review of the MIP images confirms the above findings CT Brain Perfusion Findings: ASPECTS: 5 CBF (<30%) Volume: 24mL Perfusion (Tmax>6.0s) volume: 73 mLmL Mismatch Volume: 49 mLmL Infarction Location:Left anterior (M4) and posterior (M6) MCA territories. IMPRESSION: 1. 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 The left A1 ACA and proximal left MCA are opacified; however, there is poor opacification of more distal left MCA branches. 2. RAPID  perfusion calculates a left MCA territory penumbra of 49 mL with 24 mL of core infarct, as detailed above. 3. Partially imaged ascending aortic aneurysmal dilation up to 4.1 cm. Recommend annual imaging followup by CTA or MRA. This recommendation follows 2010 ACCF/AHA/AATS/ACR/ASA/SCA/SCAI/SIR/STS/SVM Guidelines for the Diagnosis and Management of Patients with Thoracic Aortic Disease. Circulation. 2010; 121: Z610-R604. Aortic aneurysm NOS (ICD10-I71.9) Code stroke imaging results were communicated on 05/06/2020 at 1:37 pm to provider Dr. Roda Shutters Via telephone, who verbally acknowledged these results. Electronically Signed   By: Feliberto Harts MD   On: 05/06/2020 13:48    ECHOCARDIOGRAM COMPLETE   Result Date: 05/07/2020    ECHOCARDIOGRAM REPORT   Patient Name:   Aaron Weiss Date of Exam: 05/07/2020 Medical Rec #:  [id number]         Height: Accession #:    [id number]        Weight:       213.2 lb Date of Birth:  Dec 14, 1965         BSA:          2.107 m Patient Age:    54 years          BP:           183/83 mmHg Patient Gender: M                 HR:           61 bpm. Exam Location:  Inpatient Procedure: 2D Echo, Cardiac Doppler and Color Doppler Indications:    Stroke 434.91 / I163.9  History:        Patient has no prior history of Echocardiogram examinations.                 Risk Factors:Diabetes. Carotid artery occlusion with cerebral                 infarction.  Sonographer:    Celesta Gentile RCS Referring Phys: [id number] CHRISTOPHER J TEGELER IMPRESSIONS  1. Left ventricular ejection fraction, by estimation, is 60 to 65%. The left ventricle has normal function. The left ventricle has no regional wall motion abnormalities. There is mild left ventricular hypertrophy. Left ventricular diastolic parameters are consistent with Grade I diastolic dysfunction (impaired relaxation).  2. Right ventricular systolic function is normal. The right ventricular size is normal.  3. The mitral valve is grossly normal. No  evidence of mitral valve regurgitation.  4. The aortic valve is tricuspid. Aortic valve regurgitation is not visualized.  5. The inferior vena cava is normal in size with greater than 50% respiratory variability, suggesting right atrial pressure of 3 mmHg. Comparison(s): No prior  Echocardiogram. FINDINGS  Left Ventricle: Left ventricular ejection fraction, by estimation, is 60 to 65%. The left ventricle has normal function. The left ventricle has no regional wall motion abnormalities. The left ventricular internal cavity size was normal in size. There is  mild left ventricular hypertrophy. Left ventricular diastolic parameters are consistent with Grade I diastolic dysfunction (impaired relaxation). Indeterminate filling pressures. Right Ventricle: The right ventricular size is normal. No increase in right ventricular wall thickness. Right ventricular systolic function is normal. Left Atrium: Left atrial size was normal in size. Right Atrium: Right atrial size was normal in size. Pericardium: There is no evidence of pericardial effusion. Mitral Valve: The mitral valve is grossly normal. No evidence of mitral valve regurgitation. Tricuspid Valve: The tricuspid valve is grossly normal. Tricuspid valve regurgitation is trivial. Aortic Valve: The aortic valve is tricuspid. Aortic valve regurgitation is not visualized. Pulmonic Valve: The pulmonic valve was normal in structure. Pulmonic valve regurgitation is not visualized. Aorta: The aortic root and ascending aorta are structurally normal, with no evidence of dilitation. Venous: The inferior vena cava is normal in size with greater than 50% respiratory variability, suggesting right atrial pressure of 3 mmHg. IAS/Shunts: No atrial level shunt detected by color flow Doppler.  LEFT VENTRICLE PLAX 2D LVIDd:         5.30 cm  Diastology LVIDs:         3.80 cm  LV e' medial:    5.66 cm/s LV PW:         1.20 cm  LV E/e' medial:  10.6 LV IVS:        1.10 cm  LV e' lateral:    7.94 cm/s LVOT diam:     2.00 cm  LV E/e' lateral: 7.6 LV SV:         47 LV SV Index:   23 LVOT Area:     3.14 cm  RIGHT VENTRICLE RV S prime:     14.60 cm/s TAPSE (M-mode): 2.3 cm LEFT ATRIUM             Index       RIGHT ATRIUM           Index LA diam:        4.40 cm 2.09 cm/m  RA Area:     12.00 cm LA Vol (A2C):   44.5 ml 21.12 ml/m RA Volume:   24.70 ml  11.72 ml/m LA Vol (A4C):   48.4 ml 22.97 ml/m LA Biplane Vol: 53.4 ml 25.34 ml/m  AORTIC VALVE LVOT Vmax:   81.50 cm/s LVOT Vmean:  54.700 cm/s LVOT VTI:    0.151 m  AORTA Ao Root diam: 3.20 cm MITRAL VALVE MV Area (PHT): 2.80 cm    SHUNTS MV Decel Time: 271 msec    Systemic VTI:  0.15 m MV E velocity: 60.00 cm/s  Systemic Diam: 2.00 cm MV A velocity: 72.50 cm/s MV E/A ratio:  0.83 Zoila Shutter MD Electronically signed by Zoila Shutter MD Signature Date/Time: 05/07/2020/4:17:19 PM    Final     CT HEAD CODE STROKE WO CONTRAST   Result Date: 05/06/2020 CLINICAL DATA:  Code stroke. Acute stroke suspected. Neuro deficit. EXAM: CT HEAD WITHOUT CONTRAST TECHNIQUE: Contiguous axial images were obtained from the base of the skull through the vertex without intravenous contrast. COMPARISON:  None. FINDINGS: Brain: Abnormal hypodensity and loss of gray-white differentiation involving the left MCA territory, including the insula and frontoparietal cortex. The more frontoparietal area of hypodensity is more  hypodense and may be more subacute in chronicity. Mild associated sulcal effacement without midline shift. No evidence of acute hemorrhage. Remote left cerebellar infarct. Vascular: No definite hyperdense vessel. Skull: No acute fracture. Sinuses/Orbits: No acute findings. Other: No mastoid effusions. ASPECTS [facility] Stroke Program Early CT Score) - Ganglionic level infarction (caudate, lentiform nuclei, internal capsule, insula, M1-M3 cortex): 4 - Supraganglionic infarction (M4-M6 cortex): 1 Total score (0-10 with 10 being normal): 5 IMPRESSION: 1.  Findings concerning for acute left MCA territory infarct, as detailed above.ASPECTS is 5. 2. This includes an area of hypodensity in the posterior/distal left MCA territory that is more hypodense and may be more subacute in chronicity. 3. No acute hemorrhage. 4. Remote left cerebellar infarct. Code stroke imaging results were communicated on 05/06/2020 at 1:20 pm to provider Dr. Roda Shutters Via telephone. Electronically Signed   By: Feliberto Harts MD   On: 05/06/2020 13:24       Assessment/Plan: Diagnosis: Carotid artery occlusion with cerebral infarction 1. Does the need for close, 24 hr/day medical supervision in concert with the patient's rehab needs make it unreasonable for this patient to be served in a less intensive setting? Yes 2. Co-Morbidities requiring supervision/potential complications: Left MCA infarcts, hypertensive urgency, HLD, new onset diabetes, ascending aortic aneurysmal dilation up to 4.1cm, leukocytosis 3. Due to bladder management, bowel management, safety, skin/wound care, disease management, medication administration, pain management and patient education, does the patient require 24 hr/day rehab nursing? Yes 4. Does the patient require coordinated care of a physician, rehab nurse, therapy disciplines of PT, OT, SLP to address physical and functional deficits in the context of the above medical diagnosis(es)? Yes Addressing deficits in the following areas: balance, endurance, locomotion, strength, transferring, bowel/bladder control, bathing, dressing, feeding, grooming, toileting, cognition, speech, language and psychosocial support 5. Can the patient actively participate in an intensive therapy program of at least 3 hrs of therapy per day at least 5 days per week? Yes 6. The potential for patient to make measurable gains while on inpatient rehab is excellent 7. Anticipated functional outcomes upon discharge from inpatient rehab are supervision  with PT, supervision with OT,  supervision with SLP. 8. Estimated rehab length of stay to reach the above functional goals is: 10-14 days 9. Anticipated discharge destination: Home 10. Overall Rehab/Functional Prognosis: excellent   RECOMMENDATIONS: This patient's condition is appropriate for continued rehabilitative care in the following setting: CIR Patient has agreed to participate in recommended program. Yes Note that insurance prior authorization may be required for reimbursement for recommended care.   Comment: Thank you for this consult. Admission coordinator to follow.    I have personally performed a face to face diagnostic evaluation, including, but not limited to relevant history and physical exam findings, of this patient and developed relevant assessment and plan.  Additionally, I have reviewed and concur with the physician assistant's documentation above.   Sula Soda, MD    Charlton Amor, PA-C 05/08/2020        Revision History                     Routing History                Note Details  Author Carlis Abbott, Drema Pry, MD File Time 05/08/2020 12:21 PM  Author Type Physician Status Signed  Last Editor Aaron Chin, MD Service Physical Medicine and Rehabilitation

## 2020-05-15 NOTE — Progress Notes (Signed)
Patient arrived on unit, oriented to unit. Reviewed medications, therapy schedule, rehab routine and plan of care. States an understanding of information reviewed. No complications noted at this time. Patient reports no pain and is AX1, presenting with global aphasia  Aaron Weiss

## 2020-05-15 NOTE — Progress Notes (Signed)
Inpatient Rehabilitation  Patient information reviewed and entered into eRehab system by Herny Scurlock M. Brecklynn Jian, M.A., CCC/SLP, PPS Coordinator.  Information including medical coding, functional ability and quality indicators will be reviewed and updated through discharge.    

## 2020-05-16 ENCOUNTER — Inpatient Hospital Stay (HOSPITAL_COMMUNITY): Payer: BLUE CROSS/BLUE SHIELD | Admitting: Occupational Therapy

## 2020-05-16 ENCOUNTER — Inpatient Hospital Stay (HOSPITAL_COMMUNITY): Payer: BLUE CROSS/BLUE SHIELD | Admitting: Speech Pathology

## 2020-05-16 ENCOUNTER — Inpatient Hospital Stay (HOSPITAL_COMMUNITY): Payer: BLUE CROSS/BLUE SHIELD

## 2020-05-16 DIAGNOSIS — I63512 Cerebral infarction due to unspecified occlusion or stenosis of left middle cerebral artery: Secondary | ICD-10-CM | POA: Diagnosis not present

## 2020-05-16 LAB — CBC WITH DIFFERENTIAL/PLATELET
Abs Immature Granulocytes: 0.05 10*3/uL (ref 0.00–0.07)
Basophils Absolute: 0.2 10*3/uL — ABNORMAL HIGH (ref 0.0–0.1)
Basophils Relative: 1 %
Eosinophils Absolute: 0.1 10*3/uL (ref 0.0–0.5)
Eosinophils Relative: 1 %
HCT: 50.6 % (ref 39.0–52.0)
Hemoglobin: 17.7 g/dL — ABNORMAL HIGH (ref 13.0–17.0)
Immature Granulocytes: 0 %
Lymphocytes Relative: 9 %
Lymphs Abs: 1.3 10*3/uL (ref 0.7–4.0)
MCH: 31.3 pg (ref 26.0–34.0)
MCHC: 35 g/dL (ref 30.0–36.0)
MCV: 89.6 fL (ref 80.0–100.0)
Monocytes Absolute: 1.3 10*3/uL — ABNORMAL HIGH (ref 0.1–1.0)
Monocytes Relative: 9 %
Neutro Abs: 11.5 10*3/uL — ABNORMAL HIGH (ref 1.7–7.7)
Neutrophils Relative %: 80 %
Platelets: 426 10*3/uL — ABNORMAL HIGH (ref 150–400)
RBC: 5.65 MIL/uL (ref 4.22–5.81)
RDW: 12.9 % (ref 11.5–15.5)
WBC: 14.4 10*3/uL — ABNORMAL HIGH (ref 4.0–10.5)
nRBC: 0 % (ref 0.0–0.2)

## 2020-05-16 LAB — COMPREHENSIVE METABOLIC PANEL
ALT: 50 U/L — ABNORMAL HIGH (ref 0–44)
AST: 28 U/L (ref 15–41)
Albumin: 3.8 g/dL (ref 3.5–5.0)
Alkaline Phosphatase: 91 U/L (ref 38–126)
Anion gap: 15 (ref 5–15)
BUN: 45 mg/dL — ABNORMAL HIGH (ref 6–20)
CO2: 21 mmol/L — ABNORMAL LOW (ref 22–32)
Calcium: 9.8 mg/dL (ref 8.9–10.3)
Chloride: 106 mmol/L (ref 98–111)
Creatinine, Ser: 1.26 mg/dL — ABNORMAL HIGH (ref 0.61–1.24)
GFR, Estimated: >60 mL/min (ref >60)
Glucose, Bld: 205 mg/dL — ABNORMAL HIGH (ref 70–99)
Potassium: 3.8 mmol/L (ref 3.5–5.1)
Sodium: 142 mmol/L (ref 135–145)
Total Bilirubin: 1.7 mg/dL — ABNORMAL HIGH (ref 0.3–1.2)
Total Protein: 8.1 g/dL (ref 6.5–8.1)

## 2020-05-16 LAB — GLUCOSE, CAPILLARY
Glucose-Capillary: 193 mg/dL — ABNORMAL HIGH (ref 70–99)
Glucose-Capillary: 226 mg/dL — ABNORMAL HIGH (ref 70–99)
Glucose-Capillary: 203 mg/dL — ABNORMAL HIGH (ref 70–99)
Glucose-Capillary: 181 mg/dL — ABNORMAL HIGH (ref 70–99)

## 2020-05-16 MED ORDER — EXERCISE FOR HEART AND HEALTH BOOK
Freq: Once | Status: AC
  Filled 2020-05-16: qty 1

## 2020-05-16 MED ORDER — INSULIN GLARGINE 100 UNIT/ML ~~LOC~~ SOLN
12.0000 [IU] | Freq: Every day | SUBCUTANEOUS | Status: DC
  Administered 2020-05-16: 12 [IU] via SUBCUTANEOUS
  Filled 2020-05-16 (×2): qty 0.12

## 2020-05-16 MED ORDER — LIVING WELL WITH DIABETES BOOK
Freq: Once | Status: AC
  Filled 2020-05-16: qty 1

## 2020-05-16 MED ORDER — SODIUM CHLORIDE 0.45 % IV SOLN
INTRAVENOUS | Status: DC
  Administered 2020-05-19 – 2020-05-28 (×2): 1000 mL via INTRAVENOUS

## 2020-05-16 MED ORDER — BLOOD PRESSURE CONTROL BOOK
Freq: Once | Status: AC
  Filled 2020-05-16: qty 1

## 2020-05-16 NOTE — Progress Notes (Signed)
Inpatient Rehabilitation Center Individual Statement of Services  Patient Name:  Aaron Weiss  Date:  05/16/2020  Welcome to the Inpatient Rehabilitation Center.  Our goal is to provide you with an individualized program based on your diagnosis and situation, designed to meet your specific needs.  With this comprehensive rehabilitation program, you will be expected to participate in at least 3 hours of rehabilitation therapies Monday-Friday, with modified therapy programming on the weekends.  Your rehabilitation program will include the following services:  Physical Therapy (PT), Occupational Therapy (OT), Speech Therapy (ST), 24 hour per day rehabilitation nursing, Therapeutic Recreaction (TR), Neuropsychology, Care Coordinator, Rehabilitation Medicine, Nutrition Services, Pharmacy Services and Other  Weekly team conferences will be held on Wednesday to discuss your progress.  Your Inpatient Rehabilitation Care Coordinator will talk with you frequently to get your input and to update you on team discussions.  Team conferences with you and your family in attendance may also be held.  Expected length of stay: 18-21 Days  Overall anticipated outcome: Min/Mod A  Depending on your progress and recovery, your program may change. Your Inpatient Rehabilitation Care Coordinator will coordinate services and will keep you informed of any changes. Your Inpatient Rehabilitation Care Coordinator's name and contact numbers are listed  below.  The following services may also be recommended but are not provided by the Inpatient Rehabilitation Center:    Home Health Rehabiltiation Services  Outpatient Rehabilitation Services    Arrangements will be made to provide these services after discharge if needed.  Arrangements include referral to agencies that provide these services.  Your insurance has been verified to be:  [employer] [place] Your primary doctor is:  NO PCP  Pertinent information will be shared  with your doctor and your insurance company.  Inpatient Rehabilitation Care Coordinator:  Lavera Guise, [place] [phone] or [phone]  Information discussed with and copy given to patient by: [name], 05/16/2020, 1:47 PM

## 2020-05-16 NOTE — Progress Notes (Signed)
[place] PHYSICAL MEDICINE & REHABILITATION PROGRESS NOTE   Subjective/Complaints:  SLP trialing honey thick liq in place of pudding  No cough   ROS- aphasia   Objective:   No results found. Recent Labs    05/15/20 1518 05/16/20 0652  WBC 12.8* 14.4*  HGB 17.5* 17.7*  HCT 52.0 50.6  PLT 431* 426*   Recent Labs    05/15/20 0149 05/15/20 0149 05/15/20 1518 05/16/20 0652  NA 142  --   --  142  K 4.5  --   --  3.8  CL 105  --   --  106  CO2 24  --   --  21*  GLUCOSE 158*  --   --  205*  BUN 26*  --   --  45*  CREATININE 0.97   < > 1.14 1.26*  CALCIUM 9.7  --   --  9.8   < > = values in this interval not displayed.    Intake/Output Summary (Last 24 hours) at 05/16/2020 0842 Last data filed at 05/16/2020 0500 Gross per 24 hour  Intake 95 ml  Output 650 ml  Net -555 ml        Physical Exam: Vital Signs Blood pressure 119/87, pulse 76, temperature 98.4 F (36.9 C), resp. rate 18, height 5\' 6"  (1.676 m), weight 81.9 kg, SpO2 95 %.   General: No acute distress Mood and affect are appropriate Heart: Regular rate and rhythm no rubs murmurs or extra sounds Lungs: Clear to auscultation, breathing unlabored, no rales or wheezes Abdomen: Positive bowel sounds, soft nontender to palpation, nondistended Extremities: No clubbing, cyanosis, or edema Skin: No evidence of breakdown, no evidence of rash Neurologic: Cranial nerves II through XII intact, motor strength is 5/5 in left and 0/5 right  deltoid, bicep, tricep, grip, hip flexor, knee extensors, ankle dorsiflexor and plantar flexor Sensory exam cannot assess due to aphasia  Cerebellar exam normal finger to nose to finger as well as heel to shin in bilateral upper and lower extremities Musculoskeletal: Full range of motion in all 4 extremities. No joint swelling   Assessment/Plan: 1. Functional deficits which require 3+ hours per day of interdisciplinary therapy in a comprehensive inpatient rehab  setting.  Physiatrist is providing close team supervision and 24 hour management of active medical problems listed below.  Physiatrist and rehab team continue to assess barriers to discharge/monitor patient progress toward functional and medical goals  Care Tool:  Bathing              Bathing assist       Upper Body Dressing/Undressing Upper body dressing        Upper body assist      Lower Body Dressing/Undressing Lower body dressing            Lower body assist       Toileting Toileting    Toileting assist       Transfers Chair/bed transfer  Transfers assist           Locomotion Ambulation   Ambulation assist              Walk 10 feet activity   Assist           Walk 50 feet activity   Assist           Walk 150 feet activity   Assist           Walk 10 feet on uneven surface  activity   Assist  Wheelchair     Assist               Wheelchair 50 feet with 2 turns activity    Assist            Wheelchair 150 feet activity     Assist          Blood pressure 119/87, pulse 76, temperature 98.4 F (36.9 C), resp. rate 18, height 5\' 6"  (1.676 m), weight 81.9 kg, SpO2 95 %.    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 -patient may  shower -ELOS/Goals: sup/minA, 18-21d 2. Antithrombotics: -DVT/anticoagulation:Lovenox -antiplatelet therapy: Aspirin 325 mg daily and Plavix 75 mg daily x3 months then aspirin alone 3. Pain Management:All in all as needed 4. Mood:Provide emotional support -antipsychotic agents: N/A 5. Neuropsych: This patientis notcapable of making decisions on hisown behalf. 6. Skin/Wound Care:Routine skin checks 7. Fluids/Electrolytes/Nutrition:Routine in and outs with  follow-up chemistries 8. Hypertension. Lisinopril 20 mg daily. Monitor with increased mobility Vitals:   05/15/20 1933 05/16/20 0540  BP: 137/84 119/87  Pulse: 100 76  Resp: 18 18  Temp: 97.6 F (36.4 C) 98.4 F (36.9 C)  SpO2: 95% 95%  controlled 12/7  9. Dysphagia. Dysphagia #1 pudding thick liquid. Monitor hydration. Speech therapy follow-up 10. New findings diabetes mellitus. Hemoglobin A1c 8.7. Lantus insulin 10 units nightly. Diabetic teaching CBG (last 3)  Recent Labs    05/15/20 1653 05/15/20 2051 05/16/20 0544  GLUCAP 199* 196* 193*  increase lantus to 12U  11. Hyperlipidemia. Lipitor 12. Ascending aortic aneurysm with dilation 4.1 cm. Recommendations annual imaging followed by CTA or MRA. 13.  Pre reanl azotemia, IVF at noc repeat BMET later in week  14.  Leukocytosis , afeb check UA C and S  LOS: 1 days A FACE TO FACE EVALUATION WAS PERFORMED  14/07/21 05/16/2020, 8:42 AM

## 2020-05-16 NOTE — Evaluation (Signed)
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 No pertinent past medical history. Past Surgical History:  Past Surgical History:  Procedure Laterality Date  . BUBBLE STUDY  05/10/2020   Procedure: BUBBLE STUDY;  Surgeon: Jerline Pain, MD;  Location: [facility];  Service: Cardiovascular;;  . TEE WITHOUT CARDIOVERSION N/A 05/10/2020   Procedure: TRANSESOPHAGEAL ECHOCARDIOGRAM (TEE);  Surgeon: Jerline Pain, MD;  Location: [facility] ENDOSCOPY;  Service: Cardiovascular;  Laterality: N/A;    Assessment / Plan / Recommendation Clinical Impression   DGL:OVFIE A Purdue is a 54 year old right-handed male with history of hypertension but not seeing a doctor for years. Per chart review lives with brother and son. Reportedly independent prior to admission. Two-level home bed and bath upstairs. Presented 05/06/2020 with headache and altered mental status with aphasia. Blood pressure 150/90.Glucose 258. Cranial CT scan concerning for acute left MCA territory infarction. No acute hemorrhage. Remote left cerebellar infarct. 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 Patient did not receive TPA. CT cerebral perfusion scan partially imaged ascending aortic aneurysmal dilation up to 4.1 cm. Recommendations for annual imaging followed by CTA or MRA. MRI follow-up showed  large acute early subacute left MCA infarction. Echocardiogram with ejection fraction of 60 to 65% no wall motion abnormalities grade 1 diastolic dysfunction. TEE showed normal ejection fraction without thrombus. Admission chemistries glucose 261 hemoglobin A1c 8.7 urine drug screen negative. Currently maintained on aspirin 325 mg and Plavix for CVA prophylaxis x3 months then aspirin alone. Subcutaneous Lovenox for DVT prophylaxis. Currently maintained on a dysphagia #1 pudding thick liquid. Therapy evaluations completed and patient was admitted for a comprehensive rehab program 05/15/20 and SLP evaluations were completed 05/16/20 with results as follows:  Pt presents with moderately severe oropharyngeal dysphagia characterized by aspiration of liquids of various consistencies due to difficulty with timing and coordination of swallow, as well as backflow of POs through UES per last MBSS performed on 05/09/20. During today's bedside swallow he consumed minimal bites of pureed solids from his breakfast tray and a cup of pudding without overt s/sx aspiration, but right pocketing and anterior loss noted. He required cueing for slower rate and oral clearance. Oral manipulation of boluses was weak. Given that pt exhibited a delayed cough response to honey thick aspirates on last MBSS, SLP provided trial of 4 oz honey thick apple juice. Noteable right anterior loss, multiple audible swallows, and 1 instance of wet vocal quality noted during intake. Recommend continue current diet for now, with full supervision to ensure use of compensatory swallow strategies.    Pt also presents with severe expressive and mild receptive aphasia, mild dysarthria, right gaze preference, and decreased awareness of verbal and functional errors. He frequently answers yes to yes/no questions, however able to self correct during basic biographical and environmental questions in 3/4 opportunities to achieve 90% accuracy. More complex  yes/no questions resulted in decreased 50% accuracy and no awareness of errors. Pt appears to be aware of verbal  errors ~50% of the time. He followed basic 1-step directions with 100% accuracy, but 2-step was 0% accurate. Automatic sequences he had success with provided some cueing included name, counting 1-10, days of the week, months of the year. Although he initially stated "don't know" to orientation questions, provided multiple choice he could orient to hospital and season. Total A for orientation to situation. During confrontation naming activities, he was ~30% accurate without cueing, responsive naming 20% accurate. Mild perseveration on words noted, but redirected easily. Ability to repeat at word level with 90% accuracy. Pt's speech intelligibility is also reduced to ~80% at phrase level due to mild dysarthria causing articulatory imprecision.   Due to severity of dysphagia and aphasia, recommend that focus of treatment while inpatient focus on communication and swallow, and cognitive skills will be indirectly targeted but most appropriately addressed at next level of care. Interventions for dysphagia and communication recommended to maximize his functional independence, safety, independence, and least restrictive diet prior to discharge.    Skilled Therapeutic Interventions          Cognitive-linguistic and bedside swallow evaluations were administered and results were reviewed with pt (please see above for details regarding results).    SLP Assessment  Patient will need skilled Speech Lanaguage Pathology Services during CIR admission    Recommendations  SLP Diet Recommendations: Pudding;Dysphagia 1 (Puree) Liquid Administration via: Spoon Medication Administration: Crushed with puree Supervision: Full supervision/cueing for compensatory strategies Compensations: Minimize environmental distractions;Slow rate;Small sips/bites;Lingual sweep for clearance of pocketing Postural Changes and/or  Swallow Maneuvers: Seated upright 90 degrees Oral Care Recommendations: Oral care QID Patient destination: Home Follow up Recommendations: Home Health SLP;24 hour supervision/assistance Equipment Recommended: None recommended by SLP    SLP Frequency 3 to 5 out of 7 days   SLP Duration  SLP Intensity  SLP Treatment/Interventions 3-3.5 weeks  Minumum of 1-2 x/day, 30 to 90 minutes  Cueing hierarchy;Functional tasks;Cognitive remediation/compensation;Speech/Language facilitation;Internal/external aids;Multimodal communication approach;Patient/family education;Therapeutic Activities;Dysphagia/aspiration precaution training    Pain Pain Assessment Pain Scale: 0-10 Pain Score: 0-No pain  Prior Functioning Type of Home: Other(Comment) (pt nods yes to 2 story townhome)  Lives With: Other (Comment) (pt indicated brother) Available Help at Discharge: Other (Comment) (unable to express due to aphasia) Vocation: Other (Comment)  SLP Evaluation Cognition Overall Cognitive Status: Impaired/Different from baseline Arousal/Alertness: Awake/alert Orientation Level: Oriented to person (could orient to "hospital" (place) and "winter" (season) when provided cues for expressive lanugage deficits, but otherwise stated "I don't know") Attention: Sustained Sustained Attention: Appears intact Memory:  (cannot assess due to aphasia) Awareness: Impaired Awareness Impairment: Emergent impairment Problem Solving: Impaired Problem Solving Impairment: Functional basic Executive Function:  (all impaired due to lower level deficits) Safety/Judgment: Impaired Comments: decreased awareness impacts safety  Comprehension Auditory Comprehension Overall Auditory Comprehension: Impaired Yes/No Questions: Impaired Basic Biographical Questions: 76-100% accurate Basic Immediate Environment Questions: 75-100% accurate Complex Questions: 50-74% accurate Commands: Impaired One Step Basic Commands: 75-100%  accurate Two Step Basic Commands: 0-24% accurate Conversation: Simple Reading Comprehension Reading Status: Not tested Expression Expression Primary Mode of Expression: Verbal Verbal Expression Overall Verbal Expression: Impaired Initiation: No impairment Automatic Speech: Name;Social Response;Counting;Day of week Level of Generative/Spontaneous Verbalization: Word Repetition: No impairment Naming: Impairment Responsive: 51-75% accurate Confrontation: Impaired Convergent: Not tested Divergent: Not tested Verbal Errors: Other (comment) (intermittently aware of errors (~50%)) Pragmatics: No impairment Interfering Components: Speech intelligibility Non-Verbal Means of Communication: Other (comment) (TBD) Written Expression Written Expression: Not tested Oral Motor Oral Motor/Sensory Function Overall Oral Motor/Sensory Function:  Moderate impairment Facial ROM: Reduced right;Suspected CN VII (facial) dysfunction Facial Symmetry: Abnormal symmetry right;Suspected CN VII (facial) dysfunction Facial Strength: Reduced right;Suspected CN VII (facial) dysfunction Lingual ROM: Reduced left;Reduced right;Suspected CN XII (hypoglossal) dysfunction Lingual Strength: Reduced;Suspected CN XII (hypoglossal) dysfunction Mandible: Within Functional Limits Motor Speech Overall Motor Speech: Impaired Respiration: Within functional limits Phonation: Normal Resonance: Within functional limits Articulation: Impaired Level of Impairment: Word Intelligibility: Intelligibility reduced Word: 75-100% accurate Phrase: 50-74% accurate Sentence: Not tested Conversation: Not tested Motor Planning:  (Needs further assessment)  Care Tool Care Tool Cognition Expression of Ideas and Wants Expression of Ideas and Wants: Rarely/Never expressess or very difficult - rarely/never expresses self or speech is very difficult to understand   Understanding Verbal and Non-Verbal Content Understanding Verbal and  Non-Verbal Content: Sometimes understands - understands only basic conversations or simple, direct phrases. Frequently requires cues to understand   Memory/Recall Ability *first 3 days only Memory/Recall Ability *first 3 days only: That he or she is in a hospital/hospital unit;Current season (when provided cues for expressive language deficits)     Intelligibility: Intelligibility reduced Word: 75-100% accurate Phrase: 50-74% accurate Sentence: Not tested Conversation: Not tested  Bedside Swallowing Assessment General Date of Onset: 05/06/20 Previous Swallow Assessment: MBS 05/09/20 Diet Prior to this Study: Dysphagia 1 (puree);Pudding-thick liquids Temperature Spikes Noted: No Respiratory Status: Room air History of Recent Intubation: No Behavior/Cognition: Alert;Cooperative Oral Cavity - Dentition: Missing dentition (no bottom dentition, pt denied having bottom dentures) Self-Feeding Abilities: Able to feed self;Needs assist Vision: Functional for self-feeding (however right gaze pref) Patient Positioning: Upright in bed Baseline Vocal Quality: Normal Volitional Cough: Weak Volitional Swallow: Able to elicit  Oral Care Assessment Does patient have any of the following "high(er) risk" factors?: None of the above Does patient have any of the following "at risk" factors?: Saliva - thick, dry mouth;Other - dysphagia;Diet - patient on thickened liquids;Tongue - coated Patient is AT RISK: Order set for Adult Oral Care Protocol initiated -  "At Risk Patients" option selected (see row information) Ice Chips Ice chips: Not tested Thin Liquid Thin Liquid: Not tested Nectar Thick Nectar Thick Liquid: Not tested Honey Thick Oral Phase Functional Implications: Right anterior spillage Pharyngeal Phase Impairments: Multiple swallows;Wet Vocal Quality Puree Puree: Impaired Presentation: Spoon;Self Fed Oral Phase Impairments: Reduced lingual movement/coordination Oral Phase Functional  Implications: Right anterior spillage;Right lateral sulci pocketing Solid Solid: Not tested BSE Assessment Risk for Aspiration Impact on safety and function: Moderate aspiration risk Other Related Risk Factors: Previous CVA;Cognitive impairment  Short Term Goals: Week 1: SLP Short Term Goal 1 (Week 1): Pt will consume trials of honey thick liquids with minimal overt s/sx aspiration X2 prior to advancement or repeat testing. SLP Short Term Goal 2 (Week 1): Pt will consume trials of Dys 2 textures across at least 2 sessions with efficient mastication and oral clearance with no more than Min A cues for use of compensatory swallow strategies prior to advancement. SLP Short Term Goal 3 (Week 1): Pt will name functional common objects with 50% accuracy with Max A multimodal cues. SLP Short Term Goal 4 (Week 1): Pt will demonstrate ability to communiate basic needs (ex: bathroom, hungry, etc.) via gestures and/or other multimodal means with Mod A cues. SLP Short Term Goal 5 (Week 1): Pt will match words to objects with 50% accuracy with Max A multimodal cues. SLP Short Term Goal 6 (Week 1): Pt will demonstrate awareness of verbal errors in 7/10 opportunities with Mod A multimodal cues.  Refer to Care Plan for Long Term Goals  Recommendations for other services: None   Discharge Criteria: Patient will be discharged from SLP if patient refuses treatment 3 consecutive times without medical reason, if treatment goals not met, if there is a change in medical status, if patient makes no progress towards goals or if patient is discharged from hospital.  The above assessment, treatment plan, treatment alternatives and goals were discussed and mutually agreed upon: by patient  Arbutus Leas 05/16/2020, 12:29 PM

## 2020-05-16 NOTE — Evaluation (Signed)
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 No pertinent past medical history. Past Surgical History:  Past Surgical History:  Procedure Laterality Date  . BUBBLE STUDY  05/10/2020   Procedure: BUBBLE STUDY;  Surgeon: Jerline Pain, MD;  Location: [place];  Service: Cardiovascular;;  . TEE WITHOUT CARDIOVERSION N/A 05/10/2020   Procedure: TRANSESOPHAGEAL ECHOCARDIOGRAM (TEE);  Surgeon: Jerline Pain, MD;  Location: [facility] ENDOSCOPY;  Service: Cardiovascular;  Laterality: N/A;    Assessment & Plan Clinical Impression: Patient is a 54 y.o. year old male  with history of hypertension but not seeing a doctor for years. Per chart review lives with brother and son. Reportedly independent prior to admission. Two-level home bed and bath upstairs. Presented 05/06/2020 with headache and altered mental status with aphasia. Blood pressure 150/90.Glucose 258. Cranial CT scan concerning for acute left MCA territory infarction. No acute hemorrhage. Remote left cerebellar infarct. 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 Patient did not receive TPA. CT cerebral perfusion scan partially imaged ascending aortic aneurysmal dilation up to 4.1 cm. Recommendations for annual imaging followed by CTA or MRA. MRI follow-up showed large acute early subacute left  MCA infarction. Echocardiogram with ejection fraction of 60 to 65% no wall motion abnormalities grade 1 diastolic dysfunction. TEE showed normal ejection fraction without thrombus. Admission chemistries glucose 261 hemoglobin A1c 8.7 urine drug screen negative. Currently maintained on aspirin 325 mg and Plavix for CVA prophylaxis x3 months then aspirin alone. Subcutaneous Lovenox for DVT prophylaxis. Currently maintained on a dysphagia #1 pudding thick liquid.   Patient transferred to CIR on 05/15/2020 .    Patient currently requires max with basic self-care skills secondary to muscle weakness, impaired timing and sequencing, abnormal tone, unbalanced muscle activation, motor apraxia, decreased coordination and decreased motor planning, decreased visual perceptual skills and decreased visual motor skills, decreased attention to right, decreased motor planning and ideational apraxia, decreased awareness, decreased problem solving and decreased safety awareness and decreased sitting balance, decreased standing balance, decreased postural control and hemiplegia.  Prior to hospitalization, patient could complete ADL/iADL with independent .  Patient will benefit from skilled intervention to decrease level of assist with basic self-care skills and increase independence with basic self-care skills prior to discharge home with care partner.  Anticipate patient will require 24 hour supervision and minimal physical assistance and follow up home health.  OT - End of Session Activity Tolerance: Tolerates 10 - 20 min activity with multiple rests Endurance Deficit: Yes Endurance Deficit Description: fatigue with adl and transfer activity OT Assessment OT Barriers to Discharge: Home environment access/layout OT Patient demonstrates impairments in the following area(s): Balance;Cognition;Endurance;Motor;Perception;Safety;Sensory;Vision OT Basic ADL's Functional Problem(s):  Eating;Grooming;Bathing;Dressing;Toileting OT Advanced ADL's Functional Problem(s): Simple Meal Preparation;Full Meal Preparation;Laundry;Light Housekeeping OT Transfers Functional Problem(s): Toilet;Tub/Shower OT Additional Impairment(s): Fuctional Use of Upper Extremity OT Plan OT Intensity: Minimum of 1-2 x/day, 45 to 90 minutes OT Frequency: 5 out of 7  days OT Duration/Estimated Length of Stay: 3 weeks OT Treatment/Interventions: Balance/vestibular training;Neuromuscular re-education;Self Care/advanced ADL retraining;Cognitive remediation/compensation;DME/adaptive equipment instruction;Community reintegration;Patient/family education;Splinting/orthotics;UE/LE Coordination activities;Discharge planning;Functional mobility training;Therapeutic Activities;Visual/perceptual remediation/compensation OT Self Feeding Anticipated Outcome(s): set up OT Basic Self-Care Anticipated Outcome(s): min a OT Toileting Anticipated Outcome(s): min a OT Bathroom Transfers Anticipated Outcome(s): min A OT Recommendation Patient destination: Home Follow Up Recommendations: Home health OT Equipment Recommended: Tub/shower bench;To be determined   OT Evaluation Precautions/Restrictions  Precautions Precautions: Fall Precaution Comments: aphasia Restrictions Weight Bearing Restrictions: No General   Vital Signs Therapy Vitals Temp: 97.9 F (36.6 C) Pulse Rate: 87 Resp: 18 BP: 118/78 Patient Position (if appropriate): Lying Oxygen Therapy SpO2: 95 % Pain Pain Assessment Pain Scale: 0-10 Pain Score: 0-No pain Home Living/Prior Functioning Home Living Family/patient expects to be discharged to:: Private residence Living Arrangements: Alone Available Help at Discharge: Other (Comment) (to be determined, patient with difficulty expressing) Type of Home: Other(Comment) (patient unable to express - clarification needed) Home Access: Other (comment) (cannot ascertain) Home Layout: Two level,  Bed/bath upstairs Alternate Level Stairs-Number of Steps: per chart Alternate Level Stairs-Rails:  (unable to ascertain) Bathroom Shower/Tub: Tub/shower unit (per chart) Bathroom Toilet: Standard Bathroom Accessibility:  (uncertain) Additional Comments: ?accuracy of information by patient using yes/no ?'s. Expressive and receptive difficulties  Lives With: Other (Comment) (inconsistent responses, alone vs with family (? 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 working at time of CVA Vision Baseline Vision/History: No visual deficits Patient Visual Report: Blurring of vision Vision Assessment?: Yes Eye Alignment: Within Functional Limits Ocular Range of Motion: Within Functional Limits Alignment/Gaze Preference: Gaze left Tracking/Visual Pursuits: Requires cues, head turns, or add eye shifts to track Saccades: Impaired - to be further tested in functional context Convergence: Impaired (comment) (difficulty following directions for assessment) Visual Fields:  (ongoing assessment needed, responds to stimulus bilaterally but slow on right) Perception  Perception: Impaired Praxis Praxis: Impaired Praxis Impairment Details: Motor planning Cognition Overall Cognitive Status: Impaired/Different from baseline Arousal/Alertness: Awake/alert Orientation Level: Person (able to identify name with written choice of two, unable to ID place) Year: 2021 (with written choice of 2) Month: December (with written choice of 2) Day of Week: Incorrect Memory: Impaired Immediate Memory Recall: Sock;Blue;Bed Memory Recall Sock: Not able to recall Memory Recall Blue: Not able to recall Memory Recall  Bed: Not able to recall Attention: Sustained Sustained Attention: Appears intact Awareness: Impaired Awareness Impairment: Emergent impairment Problem Solving: Impaired Problem Solving Impairment: Functional basic Executive Function:  (all impaired due to lower level deficits) Safety/Judgment: Impaired Comments: decreased awareness impacts safety Sensation Sensation Light Touch: Impaired by gross assessment Proprioception: Impaired by gross assessment Coordination Gross Motor Movements are Fluid and Coordinated: No Fine Motor Movements are Fluid and Coordinated: No Coordination and Movement Description: limited active activation RLE, no active movement noted RUE Finger Nose Finger Test: unable on R Motor  Motor Motor: Hemiplegia;Abnormal tone;Abnormal postural alignment and control Motor - Skilled Clinical Observations: no active RUE noted, RLE limited to hip flexors, quads/hams but only w/combined movements  Trunk/Postural Assessment  Postural Control Postural Control: Deficits on evaluation Trunk Control: sits w/r shoulder retracted, post lean, able to correct w/cues Righting Reactions: absent R throughout  Balance Static Sitting Balance Static Sitting - Level of Assistance: 4:  Min assist Dynamic Sitting Balance Dynamic Sitting - Level of Assistance: 3: Mod assist Extremity/Trunk Assessment RUE Assessment Passive Range of Motion (PROM) Comments: shoulder flexion 100, abd 120, ER 60, distal WFL Active Range of Motion (AROM) Comments: no volitional movement noted General Strength Comments: right hand with flexor tendon nodules noted and limited digit extension 3 & 4 RUE Body System: Neuro Brunstrum levels for arm and hand: Arm;Hand Brunstrum level for arm: Stage I Presynergy Brunstrum level for hand: Stage I Flaccidity LUE Assessment LUE Assessment: Within Functional Limits General Strength Comments: 5/5  Care Tool Care Tool Self Care Eating   Eating Assist Level:  Moderate Assistance - Patient 50 - 74%    Oral Care    Oral Care Assist Level: Maximal assistance - Patient 25 - 49%    Bathing   Body parts bathed by patient: Chest;Abdomen;Front perineal area;Right upper leg;Left upper leg;Face Body parts bathed by helper: Right arm;Left arm;Chest;Front perineal area;Buttocks;Right upper leg;Left upper leg;Right lower leg;Left lower leg   Assist Level: Maximal Assistance - Patient 24 - 49%    Upper Body Dressing(including orthotics)   What is the patient wearing?: Pull over shirt   Assist Level: Maximal Assistance - Patient 25 - 49%    Lower Body Dressing (excluding footwear)   What is the patient wearing?: Pants;Incontinence brief Assist for lower body dressing: Total Assistance - Patient < 25%    Putting on/Taking off footwear   What is the patient wearing?: Ted hose;Non-skid slipper socks Assist for footwear: Dependent - Patient 0%       Care Tool Toileting Toileting activity   Assist for toileting: Total Assistance - Patient < 25%     Care Tool Bed Mobility Roll left and right activity        Sit to lying activity        Lying to sitting edge of bed activity         Care Tool Transfers Sit to stand transfer        Chair/bed transfer         Toilet transfer         Care Tool Cognition Expression of Ideas and Wants Expression of Ideas and Wants: Rarely/Never expressess or very difficult - rarely/never expresses self or speech is very difficult to understand   Understanding Verbal and Non-Verbal Content Understanding Verbal and Non-Verbal Content: Sometimes understands - understands only basic conversations or simple, direct phrases. Frequently requires cues to understand   Memory/Recall Ability *first 3 days only Memory/Recall Ability *first 3 days only: That he or she is in a hospital/hospital unit;Current season (when provided cues for expressive language deficits)    Refer to Care Plan for [place] 1 OT Short Term Goal 1 (Week 1): patient will roll in bed with min A, move to sitting and lying positions with min a OT Short Term Goal 2 (Week 1): patient will complete upper body bathing and dressing with min A OT Short Term Goal 3 (Week 1): patient will use right UE as gross stabilizer with mod cues and set up OT Short Term Goal 4 (Week 1): patient will sit unsupported with CS and complete sit pivot transfers with mod A  Recommendations for other services: None    Skilled Therapeutic Intervention  Patient in bed, alert and cooperative.  Reviewed role of OT, evaluation process, safety with mobility, goals for therapy and what to expect in upcoming sessions.  Patient presents with significant  expressive and receptive impairment, right side motor impairment, inattention to right side all which severely limit his ability to perform self care and mobility activities safely at this time.  Provided lap tray for right UE support (to bed replaced with half lap tray as able).  He demonstrates full mobility on left side with good strength and ability to follow directions with cues and repetition.  ADL and transfer training completed with good effort t/o session.  He is able to state his last name without cues, repeat words and ID name and month with written choice of 2.  He is an excellent candidate for IP rehab.  He returned to bed at close of session with max A for sit pivot transfer to right side, max A for sit to supine.  Bed alarm set and call bell in hand.   ADL ADL Eating: Moderate assistance Where Assessed-Eating: Wheelchair Grooming: Moderate assistance Where Assessed-Grooming: Wheelchair Upper Body Bathing: Maximal assistance Where Assessed-Upper Body Bathing: Wheelchair Lower Body Bathing: Maximal assistance Where Assessed-Lower Body Bathing: Bed level Upper Body Dressing: Maximal assistance Where Assessed-Upper Body Dressing: Wheelchair Lower Body Dressing: Dependent Where  Assessed-Lower Body Dressing: Bed level Toileting: Dependent Where Assessed-Toileting: Bed level ADL Comments: sit pivot transfer bed to/from w/c max A Mobility  Bed Mobility Bed Mobility: Rolling Right;Rolling Left;Right Sidelying to Sit;Sit to Sidelying Right Rolling Right: Minimal Assistance - Patient > 75% Rolling Left: Maximal Assistance - Patient 25-49% Right Sidelying to Sit: Maximal Assistance - Patient 25-49% Sit to Sidelying Right: Maximal Assistance - Patient 25-49%   Discharge Criteria: Patient will be discharged from OT if patient refuses treatment 3 consecutive times without medical reason, if treatment goals not met, if there is a change in medical status, if patient makes no progress towards goals or if patient is discharged from hospital.  The above assessment, treatment plan, treatment alternatives and goals were discussed and mutually agreed upon: by patient  Carlos Levering 05/16/2020, 3:28 PM

## 2020-05-16 NOTE — Evaluation (Signed)
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 No pertinent past medical history. Past Surgical History:  Past Surgical History:  Procedure Laterality Date  . BUBBLE STUDY  05/10/2020   Procedure: BUBBLE STUDY;  Surgeon: Jerline Pain, MD;  Location: [facility];  Service: Cardiovascular;;  . TEE WITHOUT CARDIOVERSION N/A 05/10/2020   Procedure: TRANSESOPHAGEAL ECHOCARDIOGRAM (TEE);  Surgeon: Jerline Pain, MD;  Location: [facility] ENDOSCOPY;  Service: Cardiovascular;  Laterality: N/A;    Assessment & Plan Clinical Impression: Patient is a 54 y.o. year old male  with history of hypertension but not seeing a doctor for years. Per chart review lives with brother and son. Reportedly independent prior to admission. Two-level home bed and bath upstairs. Presented 05/06/2020 with headache and altered mental status with aphasia. Blood pressure 150/90.Glucose 258. Cranial CT scan concerning for acute left MCA territory infarction. No acute hemorrhage. Remote left cerebellar infarct. 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 Patient did not receive TPA. CT cerebral perfusion scan partially imaged ascending aortic aneurysmal dilation up to 4.1 cm. Recommendations for annual imaging followed by CTA or MRA. MRI follow-up showed large acute early subacute left MCA infarction. Echocardiogram with ejection fraction of 60  to 65% no wall motion abnormalities grade 1 diastolic dysfunction. TEE showed normal ejection fraction without thrombus. Admission chemistries glucose 261 hemoglobin A1c 8.7 urine drug screen negative. Currently maintained on aspirin 325 mg and Plavix for CVA prophylaxis x3 months then aspirin alone. Subcutaneous Lovenox for DVT prophylaxis. Currently maintained on a dysphagia #1 pudding thick liquid.   Patient transferred to CIR on 05/15/2020 .    Patient currently requires max with mobility secondary to muscle weakness, impaired timing and sequencing, abnormal tone, unbalanced muscle activation, motor apraxia, decreased coordination and decreased motor planning, decreased attention to right and decreased motor planning, decreased attention, decreased awareness, decreased problem solving, decreased safety awareness and decreased memory and decreased sitting balance, decreased standing balance, decreased postural control, hemiplegia and decreased balance strategies.  Prior to hospitalization, patient was independent  with mobility and lived with Other (Comment) (pt indicated brother) in a Other(Comment) (pt nods yes to 2 story townhome) home.  Home access is  Other (comment) (cannot ascertain).  Home situation and level of assistance available at dc need to be further clarified and family will need to be fully aware of anticipated needs at DC due to significant imapairments and long term nature of recovery.  Patient will benefit from skilled PT intervention to maximize safe functional mobility, minimize fall risk and decrease caregiver burden for planned discharge home w/assistance of brother?  uncertain due to aphasia at eval.  Anticipate patient will benefit from follow up [place] at discharge.  PT - End of Session Activity Tolerance: Tolerates 30+ min activity with multiple rests Endurance Deficit: Yes Endurance Deficit Description: pt very tired at end of session, requested return to bed/declined  remaining oob in wc, cites fatigue, appears fatigued PT Assessment Rehab Potential (ACUTE/IP  ONLY): Good PT Barriers to Discharge: Inaccessible home environment;Decreased caregiver support PT Barriers to Discharge Comments: R inattention, uncertain level of assist at dc PT Patient demonstrates impairments in the following area(s): Balance;Endurance;Motor;Perception;Safety;Sensory PT Transfers Functional Problem(s): Bed Mobility;Bed to Chair;Car;Furniture;Floor PT Locomotion Functional Problem(s): Ambulation;Wheelchair Mobility;Stairs PT Plan PT Intensity: Minimum of 1-2 x/day ,45 to 90 minutes PT Frequency: 5 out of 7 days PT Treatment/Interventions: Ambulation/gait training;DME/adaptive equipment instruction;Neuromuscular re-education;Stair training;UE/LE Strength taining/ROM;Wheelchair propulsion/positioning;Balance/vestibular training;Discharge planning;Functional electrical stimulation;Therapeutic Activities;UE/LE Coordination activities;Cognitive remediation/compensation;Disease management/prevention;Functional mobility training;Patient/family education;Splinting/orthotics;Therapeutic Exercise;Visual/perceptual remediation/compensation PT Transfers Anticipated Outcome(s): min assist PT Locomotion Anticipated Outcome(s): mod assist PT Recommendation Follow Up Recommendations: 24 hour supervision/assistance;Home health PT Patient destination: Home Equipment Recommended: To be determined   PT Evaluation Precautions/Restrictions Precautions Precautions: Fall;Other (comment) Precaution Comments: aphasia Restrictions Weight Bearing Restrictions: No Pain Pain Assessment Pain Scale: 0-10 Pain Score: 0-No pain Home Living/Prior Functioning Home Living Available Help at Discharge: Other (Comment) (unable to express due to aphasia) Type of Home: Other(Comment) (pt nods yes to 2 story townhome) Home Access: Other (comment) (cannot ascertain) Home Layout: Two level;Bed/bath  upstairs Alternate Level Stairs-Number of Steps: per chart Alternate Level Stairs-Rails:  (unable to ascertain) Bathroom Shower/Tub: Tub/shower unit (per chart) Bathroom Toilet: Standard Bathroom Accessibility:  (uncertain) Additional Comments: ?accuracy of information by patient using yes/no ?'s. 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, incinsitent w/chart Comments: states he was not working, uncertain if this is accurate Vision/Perception  Vision - Assessment Eye Alignment: Impaired (comment) Ocular Range of Motion: Restricted on the right Alignment/Gaze Preference: Gaze left;Chin down;Head turned Tracking/Visual Pursuits: Impaired - to be further tested in functional context  Cognition Overall Cognitive Status: Impaired/Different from baseline Arousal/Alertness: Awake/alert Orientation Level: Oriented to person (difficult to fully assess due to aphasia) Attention: Sustained Sustained Attention: Appears intact Memory:  (cannot assess due to aphasia) Sensation Sensation Light Touch: Impaired by gross assessment (RUE/RLE) Proprioception: Impaired by gross assessment (R ankle/knee but unable to specifically assess) Coordination Gross Motor Movements are Fluid and Coordinated: No Fine Motor Movements are Fluid and Coordinated: No Coordination and Movement Description: limited active activation RLE, no active movement noted RUE Finger Nose Finger Test: unable on R Heel Shin Test: unable on R Motor  Motor Motor: Hemiplegia;Abnormal tone;Abnormal postural alignment and control Motor - Skilled Clinical Observations: no active RUE noted, RLE limited to hip  flexors, quads/hams but only w/combined movements   Trunk/Postural Assessment  Cervical Assessment Cervical Assessment:  (sits w/head rotated to L, can rotate to R but w/max cues/stimuli) Thoracic Assessment Thoracic Assessment: Exceptions to [facility] Lumbar Assessment Lumbar Assessment: Within Functional Limits Postural Control Postural Control: Deficits on evaluation Trunk Control: sits w/r shoulder retracted, post lean, able to correct w/cues Righting Reactions: absent R throughout  Balance Balance Balance Assessed: Yes Static Sitting Balance Static Sitting - Balance Support: Left upper extremity supported;Feet supported Static Sitting - Level of Assistance: 4: Min assist (post tendency) Dynamic Sitting Balance Dynamic Sitting - Balance Support: Left upper extremity supported;Feet supported Dynamic Sitting - Level of Assistance: 3: Mod assist Sitting balance - Comments: moves impulsively in sitting and causes self lob posteriorly w/scooting Static Standing Balance Static Standing - Balance Support: Bilateral upper extremity supported;During functional activity Static Standing - Level of Assistance: 2: Max assist;Other (comment) (  max of 1, min of 1) Dynamic Standing Balance Dynamic Standing - Balance Support: Bilateral upper extremity supported;During functional activity Dynamic Standing - Level of Assistance: 2: Max assist Extremity Assessment  RUE Assessment RUE Assessment: Exceptions to [facility] Passive Range of Motion (PROM) Comments: full passive ROM, pain w/ER Active Range of Motion (AROM) Comments: 0/5 throughout General Strength Comments: 0/5 during my assessment RUE Body System: Neuro LUE Assessment LUE Assessment: Within Functional Limits RLE Assessment RLE Assessment: Exceptions to [facility] Passive Range of Motion (PROM) Comments: WFL, mild tightness gastrocsoleus but yields to stretching Active Range of Motion (AROM) Comments: very limited, 0 on command but did note  below General Strength Comments: 2/5 hip w/combined hip/knee flexion following quad activation from passive hip/knee flexed position/pt able to "push therapist away" activating quads (supine) LLE Assessment LLE Assessment: Within Functional Limits General Strength Comments: 5/5 w/resistive testing, functional weakness noted w/STS/gait  Care Tool Care Tool Bed Mobility Roll left and right activity   Roll left and right assist level: Maximal Assistance - Patient 25 - 49%    Sit to lying activity   Sit to lying assist level: Moderate Assistance - Patient 50 - 74%    Lying to sitting edge of bed activity   Lying to sitting edge of bed assist level: Maximal Assistance - Patient 25 - 49%     Care Tool Transfers Sit to stand transfer   Sit to stand assist level: Maximal Assistance - Patient 25 - 49%    Chair/bed transfer   Chair/bed transfer assist level: Maximal Assistance - Patient 25 - 49%     Psychologist, counselling transfer activity did not occur: Safety/medical concerns        Care Tool Locomotion Ambulation   Assist level: 2 helpers Assistive device: Hand held assist Max distance: 10  Walk 10 feet activity   Assist level: 2 helpers Assistive device: Hand held assist   Walk 50 feet with 2 turns activity Walk 50 feet with 2 turns activity did not occur: Safety/medical concerns Assist level:  (R ankle instability limits assessment)    Walk 150 feet activity Walk 150 feet activity did not occur: Safety/medical concerns      Walk 10 feet on uneven surfaces activity Walk 10 feet on uneven surfaces activity did not occur: Safety/medical concerns      Stairs Stair activity did not occur: Safety/medical concerns        Walk up/down 1 step activity Walk up/down 1 step or curb (drop down) activity did not occur: Safety/medical concerns     Walk up/down 4 steps activity did not occuR: Safety/medical concerns  Walk up/down 4 steps activity      Walk  up/down 12 steps activity Walk up/down 12 steps activity did not occur: Safety/medical concerns      Pick up small objects from floor Pick up small object from the floor (from standing position) activity did not occur: Safety/medical concerns      Wheelchair Will patient use wheelchair at discharge?: Yes Type of Wheelchair: Manual   Wheelchair assist level: Maximal Assistance - Patient 25 - 49% Max wheelchair distance: 84f  Wheel 50 feet with 2 turns activity   Assist Level: Dependent - Patient 0%  Wheel 150 feet activity   Assist Level: Dependent - Patient 0%    Refer to Care Plan for Long Term Goals  SHORT TERM GOAL WEEK 1 PT Short Term Goal 1 (Week 1): Pt will  perform wc to/from bed w/mod assist to R, max assist of 1 to L PT Short Term Goal 2 (Week 1): sit to stand w/LRAD and mod assist PT Short Term Goal 3 (Week 1): gait x 65f w/LRAD and mod assist of 2, appropriate bracing/wrapping of R ankle PT Short Term Goal 4 (Week 1): pt will maintain sitting balance w/cross body reaching tasks w/min assist  Recommendations for other services: None   Skilled Therapeutic Intervention Evaluation completed (see details above and below) with education on PT POC and goals and individual treatment initiated with focus on functional mobility/transfers, LE strength, dynamic standing balance/coordination, ambulation, and improved endurance with activity.  Pt w/difficulty following complex verbal commands and much difficulty expressing ideas due to aphasia.  Noted apraxia w/transfers. Pt educated re use of alarm belt to decrease falls risk, operation of wc/parts/propulsion technique.  Oriented to unit and educated re scheduling/team conference. Pt nods understanding but not clear due to aphasia.  Pt provided w/applewood insert and basic foam cushion for improved comfort/positioning in wc.    Also obtained RAFO for temporary use/trial in next session due to very unstable ankle during gait  assessment/risk of injury from inversion sprain.  Noted incontinence/leaking of condom catheter during gait.  Transported to room.  Nursing stated catheter was for pm only and could be removed. Therapist removed/noted wet brief. STS from wc w/max assist/pt moved LLE into widely abducted position /returned to sitting/repositioned limb/repeated Sit to stand w/mod assist and nursing assisted w/brief change/dependent, stand to sit w/mod assist.    stand pivot transfer wc to bed w/heavy mod assist, set up assist, cues for safety/sequencing. Sit to supine w/mod assist to manage R exts. Pt repositioned for comfort.  Pt left supine w/rails up x 3, alarm set, bed in lowest position, and needs in reach.   Mobility Bed Mobility Bed Mobility: Rolling Right;Rolling Left;Right Sidelying to Sit;Sit to Sidelying Right Rolling Right: Minimal Assistance - Patient > 75% Rolling Left: Maximal Assistance - Patient 25-49% Right Sidelying to Sit: Maximal Assistance - Patient 25-49% Sit to Sidelying Right: Moderate Assistance - Patient 50-74% Transfers Transfers: Stand Pivot Transfers;Sit to Stand;Stand to Sit;Squat Pivot Transfers Sit to Stand: 2 Helpers Stand to Sit: 2 Helpers Stand Pivot Transfers: 2 Helpers Squat Pivot Transfers: Moderate Assistance - Patient 50-74% Transfer (Assistive device): None Locomotion  Gait Ambulation: Yes Gait Assistance: 2 Helpers Gait Distance (Feet): 10 Feet Assistive device: 2 person hand held assist Gait Assistance Details: total assist for advancement and stabilization of RLE, heavy mod of 2 for upright posture Gait Gait: Yes Gait Pattern: Impaired (pt w/very unstable ankle thru stance w/supination, total assist for advancement and stabilization of R knee/ankle, no trailing limb, very short step to gait, flexed posture, buckling hip/knee R) Gait velocity: dHaematologistMobility: Yes Wheelchair Assistance: Maximal Assistance - Patient 25 -  49% Wheelchair Propulsion: Left lower extremity;Left upper extremity (runs into wall on r, unaware, does not scan R, cannot self correct) Wheelchair Parts Management: Needs assistance Distance: 5 ft   Discharge Criteria: Patient will be discharged from PT if patient refuses treatment 3 consecutive times without medical reason, if treatment goals not met, if there is a change in medical status, if patient makes no progress towards goals or if patient is discharged from hospital.  The above assessment, treatment plan, treatment alternatives and goals were discussed and mutually agreed upon: by patient  BJerrilyn Cairo12/12/2019, 11:30 AM

## 2020-05-17 ENCOUNTER — Inpatient Hospital Stay (HOSPITAL_COMMUNITY): Payer: BLUE CROSS/BLUE SHIELD | Admitting: Speech Pathology

## 2020-05-17 ENCOUNTER — Inpatient Hospital Stay (HOSPITAL_COMMUNITY): Payer: BLUE CROSS/BLUE SHIELD

## 2020-05-17 ENCOUNTER — Inpatient Hospital Stay (HOSPITAL_COMMUNITY): Payer: BLUE CROSS/BLUE SHIELD | Admitting: Physical Therapy

## 2020-05-17 LAB — GLUCOSE, CAPILLARY
Glucose-Capillary: 176 mg/dL — ABNORMAL HIGH (ref 70–99)
Glucose-Capillary: 198 mg/dL — ABNORMAL HIGH (ref 70–99)
Glucose-Capillary: 228 mg/dL — ABNORMAL HIGH (ref 70–99)
Glucose-Capillary: 143 mg/dL — ABNORMAL HIGH (ref 70–99)

## 2020-05-17 MED ORDER — BISACODYL 10 MG RE SUPP
10.0000 mg | Freq: Every day | RECTAL | Status: DC | PRN
  Administered 2020-05-18 – 2020-06-09 (×4): 10 mg via RECTAL
  Filled 2020-05-17 (×4): qty 1

## 2020-05-17 MED ORDER — INSULIN GLARGINE 100 UNIT/ML ~~LOC~~ SOLN
15.0000 [IU] | Freq: Every day | SUBCUTANEOUS | Status: DC
  Administered 2020-05-17 – 2020-05-28 (×12): 15 [IU] via SUBCUTANEOUS
  Filled 2020-05-17 (×13): qty 0.15

## 2020-05-17 NOTE — Progress Notes (Signed)
   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 No pertinent past medical history. Past Surgical History:  Past Surgical History:  Procedure Laterality Date  . BUBBLE STUDY  05/10/2020   Procedure: BUBBLE STUDY;  Surgeon: Jake Bathe, MD;  Location: [facility] ENDOSCOPY;  Service: Cardiovascular;;  . TEE WITHOUT CARDIOVERSION N/A 05/10/2020   Procedure: TRANSESOPHAGEAL ECHOCARDIOGRAM (TEE);  Surgeon: Jake Bathe, MD;  Location: [facility] ENDOSCOPY;  Service: Cardiovascular;  Laterality: N/A;   Social History:  reports that he has never smoked. He has never used smokeless tobacco. He reports previous drug use. No history on file for alcohol use.  Family / Support Systems Marital Status: Single Children: Onalee Hua (Son), Other Supports: Junior (Brother), Germaine Pomfret (sister)  Social History Preferred language: English Religion: Christian Read: Yes Write: Yes Employment Status: Employed Name of Employer: Market researcher (full-time)   Abuse/Neglect Abuse/Neglect Assessment Can Be Completed: Yes Physical Abuse: Denies Verbal Abuse: Denies Sexual Abuse: Denies Exploitation of patient/patient's resources: Denies Self-Neglect: Denies  Emotional Status Recent Psychosocial Issues: no Psychiatric History: no Substance Abuse History: no  Patient / Family Perceptions, Expectations & Goals Pt/Family understanding of illness & functional limitations: yes Premorbid pt/family roles/activities: previously independent Pt/family expectations/goals: Min/Mod A  Building surveyor: None Premorbid Home Care/DME Agencies: None (none) Transportation available at discharge: family able to  transport  Discharge Planning Living Arrangements: Alone Support Systems: Children, Other relatives Type of Residence: Other (Comment) (2 level (able to stay on main level) level entry) Civil engineer, contracting: Media planner (specify) Financial Resources: Employment Surveyor, quantity Screen Referred: No Living Expenses: Psychologist, sport and exercise Management: Patient Does the patient have any problems obtaining your medications?: No Home Management: independent Care Coordinator Barriers to Discharge: Lack of/limited family support Care Coordinator Anticipated Follow Up Needs: HH/OP Expected length of stay: 18-22 Days  Clinical Impression sw called patient son, introduced self, explained role and process. Provided brief conference update. Will continue to follow up with questions or concerns  [name] 05/17/2020, 1:23 PM

## 2020-05-17 NOTE — Progress Notes (Signed)
Patient ID: Aaron Weiss, male   DOB: 08-30-1965, 54 y.o.   MRN: [id number] Met with the patient to review role of the nurse CM and collaboration with the SW to facilitate the discharge process. Reviewed risk factors for stroke and management of secondary risks including new diagnosis of  DM with A1C of 8.7, carbohydrate counting once on a CMM diet, dyslipidemia, DAPT for 3 months and then ASA solo per MD and HTN. Patient confirmed he did not have a PCP and was not aware of health issues PTA. Given information on free and reduced clinics near his home to follow up with a  PCP at discharge. Continue to follow along to discharge and review educational needs. Margarito Liner

## 2020-05-17 NOTE — Progress Notes (Addendum)
[facility] PHYSICAL MEDICINE & REHABILITATION PROGRESS NOTE   Subjective/Complaints:  Seen in PT, pt with poor comprehension, needs visual cues  ROS- aphasia   Objective:   No results found. Recent Labs    05/15/20 1518 05/16/20 0652  WBC 12.8* 14.4*  HGB 17.5* 17.7*  HCT 52.0 50.6  PLT 431* 426*   Recent Labs    05/15/20 0149 05/15/20 0149 05/15/20 1518 05/16/20 0652  NA 142  --   --  142  K 4.5  --   --  3.8  CL 105  --   --  106  CO2 24  --   --  21*  GLUCOSE 158*  --   --  205*  BUN 26*  --   --  45*  CREATININE 0.97   < > 1.14 1.26*  CALCIUM 9.7  --   --  9.8   < > = values in this interval not displayed.    Intake/Output Summary (Last 24 hours) at 05/17/2020 0927 Last data filed at 05/17/2020 0515 Gross per 24 hour  Intake --  Output 675 ml  Net -675 ml        Physical Exam: Vital Signs Blood pressure (!) 121/91, pulse 70, temperature 97.9 F (36.6 C), resp. rate 18, height 5\' 6"  (1.676 m), weight 81.9 kg, SpO2 96 %.   General: No acute distress Mood and affect are appropriate Heart: Regular rate and rhythm no rubs murmurs or extra sounds Lungs: Clear to auscultation, breathing unlabored, no rales or wheezes Abdomen: Positive bowel sounds, soft nontender to palpation, nondistended Extremities: No clubbing, cyanosis, or edema Skin: No evidence of breakdown, no evidence of rash  Neurologic: Cranial nerves II through XII intact, motor strength is 5/5 in left and 0/5 right  deltoid, bicep, tricep, grip, hip flexor, knee extensors, ankle dorsiflexor and plantar flexor Sensory exam cannot assess due to aphasia  Cerebellar exam cannot perform on Right due to weakness Musculoskeletal: Full range of motion in all 4 extremities. No joint swelling   Assessment/Plan: 1. Functional deficits which require 3+ hours per day of interdisciplinary therapy in a comprehensive inpatient rehab setting.  Physiatrist is providing close team supervision and 24 hour  management of active medical problems listed below.  Physiatrist and rehab team continue to assess barriers to discharge/monitor patient progress toward functional and medical goals  Care Tool:  Bathing    Body parts bathed by patient: Chest, Abdomen, Front perineal area, Right upper leg, Left upper leg, Face   Body parts bathed by helper: Right arm, Left arm, Chest, Front perineal area, Buttocks, Right upper leg, Left upper leg, Right lower leg, Left lower leg     Bathing assist Assist Level: Maximal Assistance - Patient 24 - 49%     Upper Body Dressing/Undressing Upper body dressing   What is the patient wearing?: Pull over shirt    Upper body assist Assist Level: Maximal Assistance - Patient 25 - 49%    Lower Body Dressing/Undressing Lower body dressing      What is the patient wearing?: Pants, Incontinence brief     Lower body assist Assist for lower body dressing: Total Assistance - Patient < 25%     Toileting Toileting    Toileting assist Assist for toileting: Total Assistance - Patient < 25%     Transfers Chair/bed transfer  Transfers assist     Chair/bed transfer assist level: Maximal Assistance - Patient 25 - 49%     Locomotion Ambulation  Ambulation assist      Assist level: 2 helpers Assistive device: Hand held assist Max distance: 10   Walk 10 feet activity   Assist     Assist level: 2 helpers Assistive device: Hand held assist   Walk 50 feet activity   Assist Walk 50 feet with 2 turns activity did not occur: Safety/medical concerns  Assist level:  (R ankle instability limits assessment)      Walk 150 feet activity   Assist Walk 150 feet activity did not occur: Safety/medical concerns         Walk 10 feet on uneven surface  activity   Assist Walk 10 feet on uneven surfaces activity did not occur: Safety/medical concerns         Wheelchair     Assist Will patient use wheelchair at discharge?: Yes Type of  Wheelchair: Manual    Wheelchair assist level: Maximal Assistance - Patient 25 - 49% Max wheelchair distance: 64ft    Wheelchair 50 feet with 2 turns activity    Assist        Assist Level: Dependent - Patient 0%   Wheelchair 150 feet activity     Assist      Assist Level: Dependent - Patient 0%   Blood pressure (!) 121/91, pulse 70, temperature 97.9 F (36.6 C), resp. rate 18, height 5\' 6"  (1.676 m), weight 81.9 kg, SpO2 96 %.    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 -patient may  shower -ELOS/Goals: sup/minA, 18-21d 2. Antithrombotics: -DVT/anticoagulation:Lovenox -antiplatelet therapy: Aspirin 325 mg daily and Plavix 75 mg daily x3 months then aspirin alone 3. Pain Management:All in all as needed 4. Mood:Provide emotional support -antipsychotic agents: N/A 5. Neuropsych: This patientis notcapable of making decisions on hisown behalf. 6. Skin/Wound Care:Routine skin checks 7. Fluids/Electrolytes/Nutrition:Routine in and outs with follow-up chemistries 8. Hypertension. Lisinopril 20 mg daily. Monitor with increased mobility Vitals:   05/16/20 2007 05/17/20 0410  BP: 119/90 (!) 121/91  Pulse: 85 70  Resp: 18 18  Temp: 97.8 F (36.6 C) 97.9 F (36.6 C)  SpO2: 97% 96%  controlled 12/8  9. Dysphagia. Dysphagia #1 pudding thick liquid. Monitor hydration. Speech therapy follow-up 10. New findings diabetes mellitus. Hemoglobin A1c 8.7. Lantus insulin 10 units nightly. Diabetic teaching CBG (last 3)  Recent Labs    05/16/20 1709 05/16/20 2043 05/17/20 0632  GLUCAP 203* 181* 176*  increase lantus to 15U  11. Hyperlipidemia. Lipitor 12. Ascending aortic aneurysm with dilation 4.1 cm. Recommendations annual imaging followed by CTA or MRA. 13.  Pre  renall azotemia, IVF at noc repeat BMET later in week  14.  Leukocytosis , afeb check UA C and S  LOS: 2 days A FACE TO FACE EVALUATION WAS PERFORMED  14/08/21 05/17/2020, 9:27 AM

## 2020-05-17 NOTE — Progress Notes (Signed)
Pt's brown tri-fold wallet found in drawer. Pt belonging form completed and taken to security and logged by Engineer, materials. Put copy of form in chart.

## 2020-05-17 NOTE — Progress Notes (Signed)
Physical Therapy Session Note  Patient Details  Name: Aaron Weiss MRN: [id number] Date of Birth: [birth year]  Today's Date: 05/17/2020 PT Individual Time: 1135-1205 PT Individual Time Calculation (min): 30 min   Short Term Goals: Week 1:  PT Short Term Goal 1 (Week 1): Pt will perform wc to/from bed w/mod assist to R, max assist of 1 to L PT Short Term Goal 2 (Week 1): sit to stand w/LRAD and mod assist PT Short Term Goal 3 (Week 1): gait x 49f w/LRAD and mod assist of 2, appropriate bracing/wrapping of R ankle PT Short Term Goal 4 (Week 1): pt will maintain sitting balance w/cross body reaching tasks w/min assist  Skilled Therapeutic Interventions/Progress Updates: Pt presented in bed awake and agreeable to therapy. Pt performed supine to sit with maxA and use of bed feautres. PTA donned shoes/AFO total A. Performed squat pivot transfer to L maxA with max multimodal cues for sequencing and safety. Pt transported to day room and performed squat pivot transfer to mat maxA x 1 with +2 present for safety. Pt participated in attaining midline with use of mirror for feedback with pt requiring mod multimodal cues to improve midline however once attained pt unable to maintain for more than a few seconds. Participated in reaching tasks while sitting EOB with emphasis on crossing midline. Pt was able to perform static sit EOM with CGA, reaching with min challenges with CGA however when added more moderate challenges forward and to L required minA and required mod to maxA with moderate challenges when crossing midline. Pt noted to abduct LLE with more challenging reaches to R causing pt to increase R lateral lean and therefore unable to recover without assist. Pt also participated in R lateral leans onto R elbow then attempting to push up with LUE to return to midline. Pt performed x 5 with mod fading to minA from PTA. Performed squat pivot transfer to w/c to R with maxA x 2 as pt unable to follow commands  to scoot to R and scooting forward instead. Pt attempted w/c mobility via hemi-technique however at this time pt unable to coordinate use of UE and LE therefore activity terminated. Pt transported back to room and kept pt in w/c due to lunch arriving shortly. Upon removal of mask pt noted to have salavia leaking from mouth, used suction to clear secretions. PTA also obtained toileting clock from nsg and placed outside room. Pt left with fully lap tray in place, belt alarm on, and current needs met.     Therapy Documentation Precautions:  Precautions Precautions: Fall Precaution Comments: aphasia Restrictions Weight Bearing Restrictions: No General:   Vital Signs:  Pain:   Mobility:   Locomotion :    Trunk/Postural Assessment :    Balance:   Exercises:   Other Treatments:      Therapy/Group: Individual Therapy  Elner Seifert 05/17/2020, 1:02 PM

## 2020-05-17 NOTE — Progress Notes (Signed)
Orthopedic Tech Progress Note Patient Details:  Aaron Weiss 1966/05/10 [id number] Called in order to [facility] for a REHAB COMBO Patient ID: Aaron Weiss, male   DOB: 08/16/1965, 54 y.o.   MRN: [id number]   [name] 05/17/2020, 10:17 AM

## 2020-05-17 NOTE — Progress Notes (Signed)
Occupational Therapy Session Note  Patient Details  Name: Keynan Heffern MRN: [id number] Date of Birth: February 06, 1966  Today's Date: 05/17/2020 OT Individual Time: 0102-7253 OT Individual Time Calculation (min): 58 min    Short Term Goals: Week 1:  OT Short Term Goal 1 (Week 1): patient will roll in bed with min A, move to sitting and lying positions with min a OT Short Term Goal 2 (Week 1): patient will complete upper body bathing and dressing with min A OT Short Term Goal 3 (Week 1): patient will use right UE as gross stabilizer with mod cues and set up OT Short Term Goal 4 (Week 1): patient will sit unsupported with CS and complete sit pivot transfers with mod A  Skilled Therapeutic Interventions/Progress Updates:    1;1. Pt received in bed agreeable to OT. Pt says "yes" to most questions and demo difficulty with expressive>receptive aphasia. Pt rolling in B directions min-total A in bed to don pants total A at bed level. Pt completes supine>sitting with MAX A for LE and trunk management. Pt requires total A to don B shoes/AFL and MAX A squat pivot to the L. Pt completes doffing shirt seated with MIN A and VC for hemi dressing strategies. MOD A To don new shirt after OT applies K tape to shoulder to help assist with shoulder activation and decrease risk of subluxation. Pt completes sorting activity at tabletop with MOD A to accurately sort blocks by colors while OT adjusts leg rest length for better positioning in w/c to decrease risk of breakdown on buttocks in sitting. Exited session with pt seated in bed, exit alarm on and call light in reach    Therapy Documentation Precautions:  Precautions Precautions: Fall Precaution Comments: aphasia Restrictions Weight Bearing Restrictions: No General:   Vital Signs: Therapy Vitals Temp: 97.9 F (36.6 C) Pulse Rate: 70 Resp: 18 BP: (!) 121/91 Patient Position (if appropriate): Lying Oxygen Therapy SpO2: 96 % O2 Device: Room  Air Pain:   ADL: ADL Eating: Moderate assistance Where Assessed-Eating: Wheelchair Grooming: Moderate assistance Where Assessed-Grooming: Wheelchair Upper Body Bathing: Maximal assistance Where Assessed-Upper Body Bathing: Wheelchair Lower Body Bathing: Maximal assistance Where Assessed-Lower Body Bathing: Bed level Upper Body Dressing: Maximal assistance Where Assessed-Upper Body Dressing: Wheelchair Lower Body Dressing: Dependent Where Assessed-Lower Body Dressing: Bed level Toileting: Dependent Where Assessed-Toileting: Bed level ADL Comments: sit pivot transfer bed to/from w/c max A Vision   Perception    Praxis   Exercises:   Other Treatments:     Therapy/Group: Individual Therapy  Shon Hale 05/17/2020, 6:50 AM

## 2020-05-17 NOTE — Progress Notes (Signed)
Speech Language Pathology Daily Session Note  Patient Details  Name: Aaron Weiss MRN: [id number] Date of Birth: [birth year]  Today's Date: 05/17/2020 SLP Individual Time: 1884-1660 SLP Individual Time Calculation (min): 42 min  Short Term Goals: Week 1: SLP Short Term Goal 1 (Week 1): Pt will consume trials of honey thick liquids with minimal overt s/sx aspiration X2 prior to advancement or repeat testing. SLP Short Term Goal 2 (Week 1): Pt will consume trials of Dys 2 textures across at least 2 sessions with efficient mastication and oral clearance with no more than Min A cues for use of compensatory swallow strategies prior to advancement. SLP Short Term Goal 3 (Week 1): Pt will name functional common objects with 50% accuracy with Max A multimodal cues. SLP Short Term Goal 4 (Week 1): Pt will demonstrate ability to communiate basic needs (ex: bathroom, hungry, etc.) via gestures and/or other multimodal means with Mod A cues. SLP Short Term Goal 5 (Week 1): Pt will match words to objects with 50% accuracy with Max A multimodal cues. SLP Short Term Goal 6 (Week 1): Pt will demonstrate awareness of verbal errors in 7/10 opportunities with Mod A multimodal cues.  Skilled Therapeutic Interventions: Pt was seen for skilled ST targeting communication and swallowing goals. After oral care via suction brush, pt accepted ~7 oz of honey thick juices with questionable wet vocal quality noted, intermittently. Pt's volitional throat clear and cough is weak, but appeared partially effective (in combination with extra dry swallows) to clear vocal quality again. During trials of Dys 2 (minced/ground) solid textures, he exhibited moderate right buccal pocketing, requiring Mod A verbal and visual cues for use of lingual sweeps and extra dry swallows to achieve oral clearance. He also required Min A cues for smaller bolus size and slower rate when self feeding. Would recommend continue current diet for now,  however pt may be ready to participate in repeat MBSS to assess potential for upgrade by the end of the week. Pt's verbalizations were limited during session, mostly to "yeah" and "it's good." However, he did clearly state "no more" to indicate he did not want to eat anymore, and able to repeat functional words such as, "bathroom, tired, and wheelchair". He demonstrated ability to use a basic 6 picture/word communication board to communicate very basic wants and needs via pointing when presented with hypothetical situations (ex: which one would you point to if you had to poop?) with 4/6 accuracy. He did self correct 1 error. Left communication board at bedside and will continue to target increasing accuracy in both multimodal and verbal communication. Pt left laying in bed with alarm set and needs within reach. Continue per current plan of care.          Pain Pain Assessment Pain Scale: 0-10 Pain Score: 0-No pain  Therapy/Group: Individual Therapy  Aaron Weiss 05/17/2020, 7:27 AM

## 2020-05-17 NOTE — Progress Notes (Signed)
Physical Therapy Session Note  Patient Details  Name: Aaron Weiss MRN: [id number] Date of Birth: March 06, 1966  Today's Date: 05/17/2020 PT Individual Time: 4401-0272 PT Individual Time Calculation (min): 60 min   Short Term Goals: Week 1:  PT Short Term Goal 1 (Week 1): Pt will perform wc to/from bed w/mod assist to R, max assist of 1 to L PT Short Term Goal 2 (Week 1): sit to stand w/LRAD and mod assist PT Short Term Goal 3 (Week 1): gait x 27f w/LRAD and mod assist of 2, appropriate bracing/wrapping of R ankle PT Short Term Goal 4 (Week 1): pt will maintain sitting balance w/cross body reaching tasks w/min assist  Skilled Therapeutic Interventions/Progress Updates:    Pt received supine in bed and agreeable to PT. Pt appears much less receptive aphasia than previous sessions have reported, but continues to have heavy expressive aphasia. Supine>R sidelying with min assist for LE management, then R sidelying>sit EOB with max assist with LEs off the bed first, then trunk posturing to upright. Pt does not complain of dizziness on EOB. PT donned shoes and AFO in sitting +2 in order to maintain seated balance. Swing pivot transfer to L with L lateral scooting max assist for setup assist and unweighting of bottom to enter wc. Pt transported to main therapy gym total assist for time management and energy conservation for dynamic seated balance task.   Dynamic seated reaching task: -seated on mat table, pt reaching to R anteriorly with L hand in order to place them on bar on table to L. Pt able to complete with CGA.   Standing Frame: -pt stood in standing frame ~175m while completing cognitive tasks with playing cards. Pt unable to accurately identify numbers on playing cards, however successfully completed reaching off to L and R while placing weight through R elbow on frame.   WC Propulsion: -Pt instructed in how to use hemi technique to propel wc. Pt unable to coordinate LLE for propulsion,  however with multimodal cues able to initiate ~25-30% of time. Pt veers to R heavily without assistance for steering due to decreased involvement of LLE. Pt needs continually education and practice for technique. Propelled ~1508fith mod asssist.  Pt transported back to room in wc total assist and performed swing pivot transfer to L with max assist. PT doffed shoes and AFO with +2 for seated balance. Sit>supine max assist for trunk and LE management as well as positioning. Pt left supine in bed with call bell in reach, needs met, and bed alarm on.   Therapy Documentation Precautions:  Precautions Precautions: Fall Precaution Comments: aphasia Restrictions Weight Bearing Restrictions: No Pain: Pain Assessment Pain Scale: 0-10 Pain Score: 0-No pain   Therapy/Group: Individual Therapy  BaiGaylord Shih/01/2020, 5:44 PM

## 2020-05-17 NOTE — Patient Care Conference (Signed)
Inpatient RehabilitationTeam Conference and Plan of Care Update Date: 05/17/2020   Time: 10:09 AM    Patient Name: Aaron Weiss      Medical Record Number: [id number]  Date of Birth: [birth year] Sex: Male         Room/Bed: 4W06C/4W06C-01 Payor Info: Payor: BLUE [employer] BLUE [employer]IELD / Plan: BCBS OTHER / Product Type: *No Product type* /    Admit Date/Time:  05/15/2020  2:51 PM  Primary Diagnosis:  Cerebral infarction due to unspecified occlusion or stenosis of left middle cerebral artery [facility])  Hospital Problems: Principal Problem:   Cerebral infarction due to unspecified occlusion or stenosis of left middle cerebral artery (HCC) Active Problems:   Left middle cerebral artery stroke [facility])    Expected Discharge Date: Expected Discharge Date: 06/09/20  Team Members Present: Physician leading conference: Dr. Claudette Laws Care Coodinator Present: Chana Bode, RN, BSN, CRRN;Christina [place], BSW Nurse Present: Margot Ables, LPN PT Present: Grier Rocher, PT OT Present: Blanch Media, OT SLP Present: Suzzette Righter, CF-SLP PPS Coordinator present : Edson Snowball, [place] Breed, SLP     Current Status/Progress Goal Weekly Team Focus  Bowel/Bladder   Incont x2; last BM 12/7 (smears)  Become continent x2; have regular BMs every day or every other day  Assess every shift and as needed   Swallow/Nutrition/ Hydration   Dys 1/pudding thick liquids, R pocketing, Min-Mod compensatory swallow strategies  Supervision A least restrictive diet  trials dys 2 and honey thick liquids, independence with compensatory swallow strategies, repeat MBSS   ADL's   MOD A UB, total A LB at bed level, MAX A transfers-squat pivot, total A toileting  min  midline orientation, sitting balance, sit to stand, transfers training, R NMR   Mobility   maxA bed mobility, maxA 1-2 transfers, maxA x2 gait with R ankle instability noted  minA overall  sitting balance, transfers, pre-gait/gait, d/c  planning   Communication   Max A  Min-Mod  naming functional objects, expresing basic wants and needs via multimodal means, awareness verbal errors, following 2-step directions and more complex yes/no questions   Safety/Cognition/ Behavioral Observations  Mod-Max orientation and awareness  Min  Will defer most direct cognitive tx to follow up level of care, but targeting awareness of verbal errors and orientation while inpatient   Pain   No pain reported during shift  Pain <3/10  Assess every shift and as needed   Skin   Bilateral arm bruising, excoriation to back/buttocks  Prevent further breakdown  Assess every shift and as needed     Discharge Planning:      Team Discussion: Currently on D1/pudding thick diet with honey thick trials pending. MD checking labs with WBC elevation. DM variable; MD adjusting lantus. Incontinent of bowel and bladder; intermittent catheterization at times. SLP noted language issues; expressive greated than receptive and mild perseveration. Patient on target to meet rehab goals: Yes; min assist goals set  *See Care Plan and progress notes for long and short-term goals.   Revisions to Treatment Plan:   Teaching Needs:   Current Barriers to Discharge: Decreased caregiver support, Home enviroment access/layout, New diabetic and Incontinence  Possible Resolutions to Barriers: Family education with son; preparing for discharge to multi-level home with set up on main living level     Medical Summary Current Status: severe aphasia, incont, had one episode of retention, leukocytosis,  Barriers to Discharge: Medical stability;Incontinence;Nutrition means   Possible Resolutions to [employer] Focus: IVF for hydration   Continued Need  for Acute Rehabilitation Level of Care: The patient requires daily medical management by a physician with specialized training in physical medicine and rehabilitation for the following reasons: Direction of a  multidisciplinary physical rehabilitation program to maximize functional independence : Yes Medical management of patient stability for increased activity during participation in an intensive rehabilitation regime.: Yes Analysis of laboratory values and/or radiology reports with any subsequent need for medication adjustment and/or medical intervention. : Yes   I attest that I was present, lead the team conference, and concur with the assessment and plan of the team.   Chana Bode B 05/17/2020, 12:54 PM

## 2020-05-17 NOTE — Progress Notes (Signed)
Patient ID: Aaron Weiss, male   DOB: [birth year], 54 y.o.   MRN: [id number] Team Conference Report to Patient/Family  Team Conference discussion was reviewed with the patient and caregiver, including goals, any changes in plan of care and target discharge date.  Patient and caregiver express understanding and are in agreement.  The patient has a target discharge date of 06/09/20.  [name] 05/17/2020, 12:48 PM

## 2020-05-18 ENCOUNTER — Inpatient Hospital Stay (HOSPITAL_COMMUNITY): Payer: BLUE CROSS/BLUE SHIELD | Admitting: Occupational Therapy

## 2020-05-18 ENCOUNTER — Inpatient Hospital Stay (HOSPITAL_COMMUNITY): Payer: BLUE CROSS/BLUE SHIELD | Admitting: Physical Therapy

## 2020-05-18 ENCOUNTER — Inpatient Hospital Stay (HOSPITAL_COMMUNITY): Payer: BLUE CROSS/BLUE SHIELD | Admitting: Speech Pathology

## 2020-05-18 LAB — CBC WITH DIFFERENTIAL/PLATELET
Abs Immature Granulocytes: 0.05 10*3/uL (ref 0.00–0.07)
Basophils Absolute: 0.1 10*3/uL (ref 0.0–0.1)
Basophils Relative: 1 %
Eosinophils Absolute: 0.3 10*3/uL (ref 0.0–0.5)
Eosinophils Relative: 2 %
HCT: 46.2 % (ref 39.0–52.0)
Hemoglobin: 15.7 g/dL (ref 13.0–17.0)
Immature Granulocytes: 0 %
Lymphocytes Relative: 12 %
Lymphs Abs: 1.4 10*3/uL (ref 0.7–4.0)
MCH: 31.2 pg (ref 26.0–34.0)
MCHC: 34 g/dL (ref 30.0–36.0)
MCV: 91.7 fL (ref 80.0–100.0)
Monocytes Absolute: 1.1 10*3/uL — ABNORMAL HIGH (ref 0.1–1.0)
Monocytes Relative: 10 %
Neutro Abs: 8.8 10*3/uL — ABNORMAL HIGH (ref 1.7–7.7)
Neutrophils Relative %: 75 %
Platelets: 347 10*3/uL (ref 150–400)
RBC: 5.04 MIL/uL (ref 4.22–5.81)
RDW: 13 % (ref 11.5–15.5)
WBC: 11.7 10*3/uL — ABNORMAL HIGH (ref 4.0–10.5)
nRBC: 0 % (ref 0.0–0.2)

## 2020-05-18 LAB — URINALYSIS, ROUTINE W REFLEX MICROSCOPIC
Bacteria, UA: NONE SEEN
Bilirubin Urine: NEGATIVE
Glucose, UA: NEGATIVE mg/dL
Ketones, ur: NEGATIVE mg/dL
Leukocytes,Ua: NEGATIVE
Nitrite: NEGATIVE
Protein, ur: NEGATIVE mg/dL
Specific Gravity, Urine: 1.026 (ref 1.005–1.030)
pH: 5 (ref 5.0–8.0)

## 2020-05-18 LAB — GLUCOSE, CAPILLARY
Glucose-Capillary: 141 mg/dL — ABNORMAL HIGH (ref 70–99)
Glucose-Capillary: 164 mg/dL — ABNORMAL HIGH (ref 70–99)
Glucose-Capillary: 149 mg/dL — ABNORMAL HIGH (ref 70–99)
Glucose-Capillary: 146 mg/dL — ABNORMAL HIGH (ref 70–99)

## 2020-05-18 LAB — BASIC METABOLIC PANEL
Anion gap: 11 (ref 5–15)
BUN: 33 mg/dL — ABNORMAL HIGH (ref 6–20)
CO2: 28 mmol/L (ref 22–32)
Calcium: 9.3 mg/dL (ref 8.9–10.3)
Chloride: 105 mmol/L (ref 98–111)
Creatinine, Ser: 1.2 mg/dL (ref 0.61–1.24)
GFR, Estimated: >60 mL/min (ref >60)
Glucose, Bld: 165 mg/dL — ABNORMAL HIGH (ref 70–99)
Potassium: 3.5 mmol/L (ref 3.5–5.1)
Sodium: 144 mmol/L (ref 135–145)

## 2020-05-18 MED ORDER — LIDOCAINE HCL URETHRAL/MUCOSAL 2 % EX GEL
1.0000 "application " | CUTANEOUS | Status: DC | PRN
  Filled 2020-05-18: qty 10
  Filled 2020-05-18: qty 5

## 2020-05-18 NOTE — Progress Notes (Signed)
Occupational Therapy Session Note  Patient Details  Name: Aaron Weiss MRN: [id number] Date of Birth: 08/09/1965  Today's Date: 05/18/2020 OT Individual Time: 1000-1100 OT Individual Time Calculation (min): 60 min    Short Term Goals: Week 1:  OT Short Term Goal 1 (Week 1): patient will roll in bed with min A, move to sitting and lying positions with min a OT Short Term Goal 2 (Week 1): patient will complete upper body bathing and dressing with min A OT Short Term Goal 3 (Week 1): patient will use right UE as gross stabilizer with mod cues and set up OT Short Term Goal 4 (Week 1): patient will sit unsupported with CS and complete sit pivot transfers with mod A  Skilled Therapeutic Interventions/Progress Updates:    Patient in bed, alert - he denies pain, yes/no responses appear accurate.  Supine to side lying with min A, side lying to sitting edge of bed with mod A.  He is able to maintain unsupported sitting with CS.  Sit pivot transfer to w/c mod A with cues.  He washed his face with min A for thoroughness, oral care initiated with suction sponges - noted large amount of clotted blood, removed upper dentures - nursing and PA [name]) alerted and evaluated - appears as though he bit his tongue and cheek - will monitor and leave dentures out at this time.  stedy transfer to toilet minA to maintain midline on stedy as he leans to the right.  Dependent for clothing management and hygiene after loose large incontinent bowel movement, dependent for change of pants and incontinence brief.  Returned to bed via stedy.  Sit to supine mod/max A.  He remained in bed at close of session, bed alarm set, call bell in hand, right UE supported on pillow, nursing present.    Therapy Documentation Precautions:  Precautions Precautions: Fall Precaution Comments: aphasia Restrictions Weight Bearing Restrictions: No   Therapy/Group: Individual Therapy  Barrie Lyme 05/18/2020, 7:42 AM

## 2020-05-18 NOTE — Progress Notes (Signed)
Physical Therapy Session Note  Patient Details  Name: Aaron Weiss MRN: [id number] Date of Birth: November 21, 1965  Today's Date: 05/18/2020 PT Individual Time: 0800-0900  PT Individual Time Calculation (min): 60 min   Short Term Goals: Week 1:  PT Short Term Goal 1 (Week 1): Pt will perform wc to/from bed w/mod assist to R, max assist of 1 to L PT Short Term Goal 2 (Week 1): sit to stand w/LRAD and mod assist PT Short Term Goal 3 (Week 1): gait x 67f w/LRAD and mod assist of 2, appropriate bracing/wrapping of R ankle PT Short Term Goal 4 (Week 1): pt will maintain sitting balance w/cross body reaching tasks w/min assist  Skilled Therapeutic Interventions/Progress Updates:   Session 1:  Pt received supine in bed and agreeable to PT. Pt does not complain of pain at this time. PT donned pants mod assist in supine with patient performing single leg bridge in order to pull pants over bottom. Ted hose donned total assist in supine. Supine>R sidelying min assist for LE management. PT removed legs from bed, max assist, then provided max assist to come to sitting. With +2 assistance for seated balance, PT donned shoes total assist. Swing pivot to L with max assist. Required 2 attempts in order to clear bottom over w/c cushion. Pt transported to therapy gym total assist for time management and energy conservation.   Parallel Bars: -sit>stand x2 for ~30sec each with addition of weight shifts during second bout. Pt requiring heavy RLE blocking in order to maintain standing position, however is able to pull himself to stand with min assist in parallel bars with blocked knee. Has difficulty weight shifting onto RLE, therefore PT provided min assist at pelvis to initiate.   Gait w/ L Railing: -sit>stand with mod assist for RLE blocking and RUE over PT shoulder. Pt requiring max assist for gait at this time due to inactivity of the RLE, difficulty sequencing task and lack of postural control in standing.   -52f [id number] mirror in front for visual feedback.  -pt responds very well to verbal cueing for sequencing.  -following, pt reports high levels of fatigue and requests to remain seated.   Seated reaching tasks w/ added cognitive task: -Pt seated in wc with feet flat, instructed to reach off to his L and R 10x each while reaching for dots. Asked to state the number on dot prior to grasping. Pt unable to accurately name numbers at this time.  -pt instructed to push through RUE/LE throughout task with PT providing mod assist to accomplish.   WC Propulsion: -pt propelled wc with mod-max assist for steering and multimodal cueing for use of his LLE. Pt does a great job using his L hand, but is having large amounts of difficulty steering due to his R inattention.   Pt transported back to room total assist and left siting in wc with chair alarm on, call bell in reach and needs met. Physician present at end of session.   Session 2:  Pt received supine in bed. Pt with noticeable bleeding of the mouth and PT was informed earlier that pt dentures had been removed and several blood clots were found in mouth. PT assisted with oral hygiene total assist. Pt complains of pain in his mouth, but is unable to rate. Pt unwilling to participate in therapy due to significant bleeding of the mouth and pain. Pt missed 30 minutes of physical therapy.   Therapy Documentation Precautions:  Precautions Precautions:  Fall Precaution Comments: aphasia Restrictions Weight Bearing Restrictions: No Vital Signs: Therapy Vitals Temp: 98.3 F (36.8 C) Pulse Rate: (!) 58 Resp: 19 BP: 124/84 Patient Position (if appropriate): Lying Oxygen Therapy SpO2: 98 % O2 Device: Room Air    Therapy/Group: Individual Therapy  Gaylord Shih 05/18/2020, 12:05 PM

## 2020-05-18 NOTE — Progress Notes (Signed)
[facility] PHYSICAL MEDICINE & REHABILITATION PROGRESS NOTE   Subjective/Complaints:  Remains globally aphasic but participates well in PT, CBC and BMET reviewed improving   ROS- aphasia   Objective:   No results found. Recent Labs    05/16/20 0652 05/18/20 0331  WBC 14.4* 11.7*  HGB 17.7* 15.7  HCT 50.6 46.2  PLT 426* 347   Recent Labs    05/16/20 0652 05/18/20 0331  NA 142 144  K 3.8 3.5  CL 106 105  CO2 21* 28  GLUCOSE 205* 165*  BUN 45* 33*  CREATININE 1.26* 1.20  CALCIUM 9.8 9.3    Intake/Output Summary (Last 24 hours) at 05/18/2020 0837 Last data filed at 05/17/2020 1500 Gross per 24 hour  Intake 1884.17 ml  Output -  Net 1884.17 ml        Physical Exam: Vital Signs Blood pressure 119/80, pulse (!) 57, temperature 98.2 F (36.8 C), resp. rate 16, height 5\' 6"  (1.676 m), weight 81.9 kg, SpO2 97 %.   General: No acute distress Mood and affect are appropriate Heart: Regular rate and rhythm no rubs murmurs or extra sounds Lungs: Clear to auscultation, breathing unlabored, no rales or wheezes Abdomen: Positive bowel sounds, soft nontender to palpation, nondistended Extremities: No clubbing, cyanosis, or edema Skin: No evidence of breakdown, no evidence of rash  Neurologic: Cranial nerves II through XII intact, motor strength is 5/5 in left and 0/5 right  deltoid, bicep, tricep, grip, hip flexor, knee extensors, ankle dorsiflexor and plantar flexor Sensory exam cannot assess due to aphasia  Cerebellar exam cannot perform on Right due to weakness Musculoskeletal: Full range of motion in all 4 extremities. No joint swelling   Assessment/Plan: 1. Functional deficits which require 3+ hours per day of interdisciplinary therapy in a comprehensive inpatient rehab setting.  Physiatrist is providing close team supervision and 24 hour management of active medical problems listed below.  Physiatrist and rehab team continue to assess barriers to  discharge/monitor patient progress toward functional and medical goals  Care Tool:  Bathing    Body parts bathed by patient: Chest,Abdomen,Front perineal area,Right upper leg,Left upper leg,Face   Body parts bathed by helper: Right arm,Left arm,Chest,Front perineal area,Buttocks,Right upper leg,Left upper leg,Right lower leg,Left lower leg     Bathing assist Assist Level: Maximal Assistance - Patient 24 - 49%     Upper Body Dressing/Undressing Upper body dressing   What is the patient wearing?: Pull over shirt    Upper body assist Assist Level: Maximal Assistance - Patient 25 - 49%    Lower Body Dressing/Undressing Lower body dressing      What is the patient wearing?: Pants,Incontinence brief     Lower body assist Assist for lower body dressing: Total Assistance - Patient < 25%     Toileting Toileting    Toileting assist Assist for toileting: Total Assistance - Patient < 25%     Transfers Chair/bed transfer  Transfers assist     Chair/bed transfer assist level: Maximal Assistance - Patient 25 - 49%     Locomotion Ambulation   Ambulation assist      Assist level: 2 helpers Assistive device: Hand held assist Max distance: 10   Walk 10 feet activity   Assist     Assist level: 2 helpers Assistive device: Hand held assist   Walk 50 feet activity   Assist Walk 50 feet with 2 turns activity did not occur: Safety/medical concerns  Assist level:  (R ankle instability limits assessment)  Walk 150 feet activity   Assist Walk 150 feet activity did not occur: Safety/medical concerns         Walk 10 feet on uneven surface  activity   Assist Walk 10 feet on uneven surfaces activity did not occur: Safety/medical concerns         Wheelchair     Assist Will patient use wheelchair at discharge?: Yes Type of Wheelchair: Manual    Wheelchair assist level: Maximal Assistance - Patient 25 - 49% Max wheelchair distance: 35ft     Wheelchair 50 feet with 2 turns activity    Assist        Assist Level: Dependent - Patient 0%   Wheelchair 150 feet activity     Assist      Assist Level: Dependent - Patient 0%   Blood pressure 119/80, pulse (!) 57, temperature 98.2 F (36.8 C), resp. rate 16, height 5\' 6"  (1.676 m), weight 81.9 kg, SpO2 97 %.    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 -patient may  shower -ELOS/Goals: sup/minA, 18-21d 2. Antithrombotics: -DVT/anticoagulation:Lovenox -antiplatelet therapy: Aspirin 325 mg daily and Plavix 75 mg daily x3 months then aspirin alone 3. Pain Management:All in all as needed 4. Mood:Provide emotional support -antipsychotic agents: N/A 5. Neuropsych: This patientis notcapable of making decisions on hisown behalf. 6. Skin/Wound Care:Routine skin checks 7. Fluids/Electrolytes/Nutrition:Routine in and outs with follow-up chemistries 8. Hypertension. Lisinopril 20 mg daily. Monitor with increased mobility Vitals:   05/17/20 2002 05/18/20 0404  BP: 124/84 119/80  Pulse: 70 (!) 57  Resp: 16 16  Temp: 98.7 F (37.1 C) 98.2 F (36.8 C)  SpO2: 98% 97%  controlled 12/9  9. Dysphagia. Dysphagia #1 pudding thick liquid. Monitor hydration. Speech therapy follow-up 10. New findings diabetes mellitus. Hemoglobin A1c 8.7. Lantus insulin 10 units nightly. Diabetic teaching CBG (last 3)  Recent Labs    05/17/20 1715 05/17/20 2117 05/18/20 0632  GLUCAP 228* 143* 141*  increase lantus to 15U  11. Hyperlipidemia. Lipitor 12. Ascending aortic aneurysm with dilation 4.1 cm. Recommendations annual imaging followed by CTA or MRA. 13.  Pre renal azotemia, IVF at noc repeat BMET later in week  14.  Leukocytosis , afeb check UA C and S  LOS: 3 days A FACE TO  FACE EVALUATION WAS PERFORMED  14/09/21 05/18/2020, 8:37 AM

## 2020-05-18 NOTE — IPOC Note (Signed)
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 (HCC) Active Problems:   Left middle cerebral artery stroke [facility])     Functional Problem List: Nursing Bladder,Bowel,Endurance,Medication Management,Perception,Skin Integrity  PT Balance,Endurance,Motor,Perception,Safety,Sensory  OT Balance,Cognition,Endurance,Motor,Perception,Safety,Sensory,Vision  SLP Linguistic,Cognition,Nutrition,Safety  TR         Basic ADL's: OT Eating,Grooming,Bathing,Dressing,Toileting     Advanced  ADL's: OT Simple Meal Preparation,Full Meal Preparation,Laundry,Light Housekeeping     Transfers: PT Bed Mobility,Bed to [employer]  OT Toilet,Tub/Shower     Locomotion: PT Ambulation,Wheelchair Mobility,Stairs     Additional Impairments: OT Fuctional Use of Upper Extremity  SLP Swallowing,Communication,Social Cognition comprehension,expression Awareness  TR      Anticipated Outcomes Item Anticipated Outcome  Self Feeding set up  Swallowing  Supervision A   Basic self-care  min a  Toileting  min a   Bathroom Transfers min A  Bowel/Bladder  regain continence and maintain regular pattern of emptying bowel and bladder  Transfers  min assist  Locomotion  mod assist  Communication  Min-Mod A  Cognition     Pain  no pain or less than 2  Safety/Judgment  remain free of falls, skin breakdown and infection   Therapy Plan: PT Intensity: Minimum of 1-2 x/day ,45 to 90 minutes PT Frequency: 5 out of 7 days OT Intensity: Minimum of 1-2 x/day, 45 to 90 minutes OT Frequency: 5 out of 7 days OT Duration/Estimated Length of Stay: 3 weeks SLP Intensity: Minumum of 1-2 x/day, 30 to  90 minutes SLP Frequency: 3 to 5 out of 7 days SLP Duration/Estimated Length of Stay: 3-3.5 weeks   Due to the current state of emergency, patients may not be receiving their 3-hours of Medicare-mandated therapy.   Team Interventions: Nursing Interventions Patient/Family Education,Bladder Management,Bowel Management,Disease Management/Prevention,Medication Management,Cognitive Remediation/Compensation,Dysphagia/Aspiration Precaution Training,Discharge Planning,Psychosocial Support  PT interventions Ambulation/gait training,DME/adaptive equipment instruction,Neuromuscular re-education,Stair training,UE/LE Strength taining/ROM,Wheelchair propulsion/positioning,Balance/vestibular training,Discharge planning,Functional electrical stimulation,Therapeutic Activities,UE/LE Coordination activities,Cognitive remediation/compensation,Disease management/prevention,Functional mobility training,Patient/family education,Splinting/orthotics,Therapeutic Exercise,Visual/perceptual remediation/compensation  OT Interventions Balance/vestibular training,Neuromuscular re-education,Self Care/advanced ADL retraining,Cognitive remediation/compensation,DME/adaptive equipment instruction,Community reintegration,Patient/family education,Splinting/orthotics,UE/LE Coordination activities,Discharge planning,Functional mobility training,Therapeutic Activities,Visual/perceptual remediation/compensation  SLP Interventions Cueing hierarchy,Functional tasks,Cognitive remediation/compensation,Speech/Language facilitation,Internal/external aids,Multimodal communication approach,Patient/family education,Therapeutic Activities,Dysphagia/aspiration precaution training  TR Interventions    SW/CM Interventions Discharge Planning,Psychosocial Support,Patient/Family Education   Barriers to Discharge MD  Medical stability, Medication compliance and Nutritional means  Nursing      PT Inaccessible home environment,Decreased caregiver support R  inattention, uncertain level of assist at [employer]  OT Home environment access/layout    SLP      SW Lack of/limited family support     Team Discharge Planning: Destination: PT-Home ,OT- Home , SLP-Home Projected Follow-up: PT-24 hour supervision/assistance,Home health PT, OT-  Home health OT, SLP-Home Health SLP,24 hour supervision/assistance Projected Equipment Needs: PT-To be determined, OT- Tub/shower bench,To be determined, SLP-None recommended by SLP Equipment Details: PT- , OT-  Patient/family involved in discharge planning: PT- Patient,Other (Comment) (family not present),  OT-Patient (verbal review with patient he is unable to express level of understanding), SLP-Patient  MD ELOS: 22-25d Medical Rehab Prognosis:  Good Assessment:  54 year old right-handed male with history of hypertension but not seeing a doctor for years. Per chart review lives with brother and son. Reportedly independent prior to admission. Two-level home bed and  bath upstairs. Presented 05/06/2020 with headache and altered mental status with aphasia. Blood pressure 150/90.Glucose 258. Cranial CT scan concerning for acute left MCA territory infarction. No acute hemorrhage. Remote left cerebellar infarct. 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 Patient did not receive TPA. CT cerebral perfusion scan partially imaged ascending aortic aneurysmal dilation up to 4.1 cm. Recommendations for annual imaging followed by CTA or MRA. MRI follow-up showed large acute early subacute left MCA infarction. Echocardiogram with ejection fraction of 60 to 65% no wall motion abnormalities grade 1 diastolic dysfunction. TEE showed normal ejection fraction without thrombus. Admission chemistries glucose 261 hemoglobin A1c 8.7 urine drug screen negative. Currently maintained on aspirin 325 mg and Plavix for CVA prophylaxis x3 months then aspirin alone.  Subcutaneous Lovenox for DVT prophylaxis. Currently maintained on a dysphagia #1 pudding thick liquid. Therapy evaluations completed and patient was admitted for a comprehensive rehab program.   Now requiring 24/7 Rehab RN,MD, as well as CIR level PT, OT and SLP.  Treatment team will focus on ADLs and mobility with goals set at [facility] A See Team Conference Notes for weekly updates to the plan of care

## 2020-05-18 NOTE — Progress Notes (Addendum)
Nurse attempted to I&O cath patient and was not successful. Night nurse also not successful with cath previously this morning. Patient did have incontinent episode this morning. Leann, NT attempted to cath and got a 75 mL return of bright red blood with three large clots. Catheter was blocked with large clots when pulled out. Pam, PA notified and new orders being put in.

## 2020-05-18 NOTE — Progress Notes (Signed)
Attempted in and out cath at 0628, catheter met resistance and coiling. Attempted two times and received resistance and blood return. Pt showed grimace on face with cath attempt. Last void 05/17/20 at 2219 in brief. Pt was bladder scanned at 0408 05/18/20 for 146m. No other concerns to report. Will advise day shift nurse.

## 2020-05-18 NOTE — Progress Notes (Signed)
Contacted Dr. Demzik/Urology on call regarding patient's urinary retention with hematuria/clots at I/O attempts. She will come in to place foley. Urology cart ordered.

## 2020-05-18 NOTE — Progress Notes (Signed)
Nursing student reported to nurse that patient was half way out of the chair trying to ambulate by himself. Chair alarm going off and patient redirected to the bed. Therapy made aware of impulsiveness and bed alarm on with call light within reach, toileting offered

## 2020-05-18 NOTE — Progress Notes (Signed)
Nurse called into room by therapy regarding blood coming from the mouth. Large blood clots coming from mouth. Dentures were removed from mouth. Nurse assessed patient, provided oral suction and had [name], [name] stop by. No new orders obtained. Will continue to suction patient as needed and assess mouth every hour. Patient reports no pain at the moment and states he is not experiencing discomfort. Aaron Weiss

## 2020-05-18 NOTE — Progress Notes (Signed)
Speech Language Pathology Daily Session Note  Patient Details  Name: Aaron Weiss MRN: [id number] Date of Birth: 1965-09-12  Today's Date: 05/18/2020 SLP Individual Time: 1300-1355 SLP Individual Time Calculation (min): 55 min  Short Term Goals: Week 1: SLP Short Term Goal 1 (Week 1): Pt will consume trials of honey thick liquids with minimal overt s/sx aspiration X2 prior to advancement or repeat testing. SLP Short Term Goal 2 (Week 1): Pt will consume trials of Dys 2 textures across at least 2 sessions with efficient mastication and oral clearance with no more than Min A cues for use of compensatory swallow strategies prior to advancement. SLP Short Term Goal 3 (Week 1): Pt will name functional common objects with 50% accuracy with Max A multimodal cues. SLP Short Term Goal 4 (Week 1): Pt will demonstrate ability to communiate basic needs (ex: bathroom, hungry, etc.) via gestures and/or other multimodal means with Mod A cues. SLP Short Term Goal 5 (Week 1): Pt will match words to objects with 50% accuracy with Max A multimodal cues. SLP Short Term Goal 6 (Week 1): Pt will demonstrate awareness of verbal errors in 7/10 opportunities with Mod A multimodal cues.  Skilled Therapeutic Interventions: Pt was seen for skilled ST targeting dysphagia and communication goals. Thorough oral care via suction sponge completed in attempt to remove dried and active bleeding from oral cavity - spoke with RN who reports it is coming from pt's tongue, which he may have bitten. Once clean, pt accepted 4 oz honey thick water trials via tsp with 1 instance of wet voice and delayed cough with first presentation. He politely declined purees from his lunch tray, despite encouragement from SLP. Suspect pt may have pain from bleeding, which is further limited his already scarce PO intake at this time. Recommend repeating MBSS tomorrow to assess potential for upgrade, continue current diet for now. During structured  speech/language task, pt unable to read accurately at word level, which was evident during a task in which he was asked to match words to objects from field of 2. This resulted in 0% accuracy when SLP did not read word aloud for pt. When SLP did read word aloud, pt with 7/9 accuracy in matching it to the correct object. He required Mod A verbal and visual cueing in order to use basic 6 picture communication board with 50% accuracy - it appeared that visual inattention also may be impacting accuracy with communication board, as he rarely attended to pictures on the right side of the page. He was independently verbally able to orient to month, required multiple choice cues for orientation to place, Min-Mod A phonemic cues to orient to situation (stroke) and his name. Pt left laying slightly elevated in bed, alarm set and needs within reach. Continue per current plan of care.      Pain Pain Assessment Pain Scale: Faces Faces Pain Scale: No hurt  Therapy/Group: Individual Therapy  Aaron Weiss 05/18/2020, 7:20 AM

## 2020-05-19 ENCOUNTER — Inpatient Hospital Stay (HOSPITAL_COMMUNITY): Payer: BLUE CROSS/BLUE SHIELD | Admitting: Physical Therapy

## 2020-05-19 ENCOUNTER — Encounter (HOSPITAL_COMMUNITY): Payer: BLUE CROSS/BLUE SHIELD | Admitting: Speech Pathology

## 2020-05-19 ENCOUNTER — Inpatient Hospital Stay (HOSPITAL_COMMUNITY): Payer: BLUE CROSS/BLUE SHIELD

## 2020-05-19 ENCOUNTER — Inpatient Hospital Stay (HOSPITAL_COMMUNITY): Payer: BC Managed Care – PPO

## 2020-05-19 LAB — GLUCOSE, CAPILLARY
Glucose-Capillary: 138 mg/dL — ABNORMAL HIGH (ref 70–99)
Glucose-Capillary: 182 mg/dL — ABNORMAL HIGH (ref 70–99)
Glucose-Capillary: 132 mg/dL — ABNORMAL HIGH (ref 70–99)
Glucose-Capillary: 137 mg/dL — ABNORMAL HIGH (ref 70–99)

## 2020-05-19 NOTE — Consult Note (Addendum)
WOC Nurse Consult Note: Consult for WOC nurse placed on wrong patient. This patient is not in need of our services.  Renaldo Reel Katrinka Blazing, MSN, RN, CMSRN, Angus Seller, [facility] Wound Treatment Associate Pager [phone]

## 2020-05-19 NOTE — Progress Notes (Signed)
Physical Therapy Session Note  Patient Details  Name: Aaron Weiss MRN: [id number] Date of Birth: 1965/09/28  Today's Date: 05/19/2020 PT Individual Time: 1620-1700 PT Individual Time Calculation (min): 40 min   Short Term Goals: Week 1:  PT Short Term Goal 1 (Week 1): Pt will perform wc to/from bed w/mod assist to R, max assist of 1 to L PT Short Term Goal 2 (Week 1): sit to stand w/LRAD and mod assist PT Short Term Goal 3 (Week 1): gait x 34f w/LRAD and mod assist of 2, appropriate bracing/wrapping of R ankle PT Short Term Goal 4 (Week 1): pt will maintain sitting balance w/cross body reaching tasks w/min assist  Skilled Therapeutic Interventions/Progress Updates:    Session 1: Pt received supine in bed, asleep, unable to be aroused. PT will attempt to return to make up missed time. Missed 45 minutes of skilled physical therapy.  Session 2: Pt received supine in bed and agreeable to PT. Supine>L sidelying with mod assist this date for RLE/UE management, and L sidelying>sit with mod assist in order to remove legs from bed and maintain erect trunk posture. Swing pivot to R with mod assist for LE positioning, verbal cueing, unweighting of bottom, and guiding of pelvis throughout transfer. Pt transported to hallway for gait training at railing total assist for time management and energy conservation.   Gait at L Railing: -pt ambulated ~151fx2 with max assist for erect trunk posturing, verbal/tactile cueing for sequencing, and RLE advancement/blocking. wc follow for safety. Mirror placed in front of pt for visual feedback. During second attempt, sequencing was much more fluid, however continued to need verbal cueing and above stated forms of assistance. Rest break provided between.   Kinetron: -Pt performed 2 bouts of ~69m6m@ 30cm/sec while seated in wc. Rest break provided between bouts. On second bout, pt instructed to hold L handhold in order to keep from tipping chair backwards. Pt  requiring low min assist for hip extension, however requiring mod assist for hip flexion with PT providing facilitation at hamstring insertion.   Pt transported to room in wc total assist. Swing pivot to L into bed with mod assist for RLE blocking, unweighting of bottom, and verbal cueing for sequencing. Pt with LOB to L as his bottom landed on the bed. Pt instructed to sit back up, and able to perform with min assist. Lateral scooting to L with CGA. Sit>supine min assist for RLE management and positioning. Pt able to position hips and trunk with verbal cueing. Pt left semi reclined in bed with bed alarm on, needs met, and call bell in reach. No pain reported this session.    Therapy Documentation Precautions:  Precautions Precautions: Fall Precaution Comments: aphasia Restrictions Weight Bearing Restrictions: No   Therapy/Group: Individual Therapy  BaiGaylord Shih/03/2020, 7:11 AM

## 2020-05-19 NOTE — Progress Notes (Signed)
Attempted to see patient this morning, the patient was gone from room.  Patient was undergoing repeat modified barium swallow study.

## 2020-05-19 NOTE — Procedures (Signed)
Foley Catheter Placement Note  Indications: urinary retention. Urology was consulted following several failed attempts at foley placement which resulted in only blood return.  Pre-operative Diagnosis: urinary retention and likely urethral trauma  Post-operative Diagnosis: same  Surgeon: Patience Musca, MD  Assistants: None  Procedure Details  Patient was placed in the supine position, prepped with Betadine and draped in the usual sterile fashion.  We injected lidocaine jelly per urethra prior to the procedure.  Blood at meatus on exam prior to placement attempts. Attempted to place an 18Fr coude catheter however experienced resistance near the prostatic urethra. Elected to placed a sensor wire into the bladder given concern for previous false passages We then inserted a 16 [place] [employer] tip catheter per urethra which easily passed over the sensor wire and into the bladder without any resistance at the prostatic urethra.  We achieved return of clear yellow urine and then proceeded to insert 10 mL of sterile water into the Foley balloon.  The catheter was attached to a drainage bag and secured with a StatLock.                 Complications: None; patient tolerated the procedure well.  Plan:   1.  Continue Foley catheter to drainage for 7 days due to urethral injury during previous placement attempts. 2.  Remove Foley catheter with primary care provider or hospital in 7 days with trial of void. Patient will hopefully be more mobile/interactive/alert at that time. If this is not possible then contact [facility] and we will arrange a visit  for removal of Catheter and trial of void.       Attending Attestation: Dr. Cardell Peach was available.

## 2020-05-19 NOTE — Progress Notes (Signed)
Modified Barium Swallow Progress Note  Patient Details  Name: Aaron Weiss MRN: [id number] Date of Birth: 1965/10/24  Today's Date: 05/19/2020  Modified Barium Swallow completed.  Full report located under Chart Review in the Imaging Section.  Brief recommendations include the following:  Clinical Impression  Pt continues to present with moderately severe oropharyngeal dysphagia, however noteworthy functional improvement in swallow function was visualized during repeat MBSS today (in comparison to initial MBSS on 05/09/20). Pt exhibits significant weakness of facial and oral musculature, which results in anterior spillage, labial residue, weak lingual manipulation of boluses, and decreased oral control of thinner boluses. Oral phase deficits resulted in premature spillage of nectar thick barium into the laryngeal vestibule, which was aspirated during the swallow without any sensation (PAS score 8). Pt's volitional cough was weak and was ineffective to clear aspirate when cued by SLP. Improved oral control of honey thick liquids noted today. His swallow initial of honey thick barium occurred most consistently at the vallecular sinuses, or just slightly past. Timing of of epiglottic deflection is still mildly delayed, however pharyngeal stripping and overall effectiveness of airway protection is improved. No aspiration noted throughout several honey thick barium trials via cup and tsp. A small amount of honey did penetrate above the level of the vocal folds X1 (PAS score 2), however during next set of imaging, the vestibule was clear of penetrates.  Recommend pt continue pureed solid textures (dys 1), upgrade to honey thick liquids, continue medications crushed in applesauce or pudding and full supervision to ensure safe swallow strategies. Pt may take some self-fed small cup sips of honey liquids, however intake was most efficient when boluses were delivered via tsp - pt will need assistance with  using tsp. ST will continue to provide skilled interventions in order to ensure diet safety and efficiency, as well as readiness for additional testing, advancement, and increasing independence with compensatory swallow strategies.   Swallow Evaluation Recommendations      SLP Diet Recommendations: Dysphagia 1 (Puree) solids;Honey thick liquids   Liquid Administration via: Spoon;Cup   Medication Administration: Crushed with puree   Supervision: Staff to assist with self feeding;Full supervision/cueing for compensatory strategies   Compensations: Minimize environmental distractions;Slow rate;Small sips/bites;Lingual sweep for clearance of pocketing   Postural Changes: Remain semi-upright after after feeds/meals (Comment);Seated upright at 90 degrees   Oral Care Recommendations: Oral care BID   Other Recommendations: Order thickener from pharmacy;Remove water pitcher;Prohibited food (jello, ice cream, thin soups);Have oral suction available    [name] 05/19/2020,9:53 AM

## 2020-05-19 NOTE — Progress Notes (Signed)
Occupational Therapy Session Note  Patient Details  Name: Aaron Weiss MRN: [id number] Date of Birth: 1966-01-18  Today's Date: 05/19/2020 OT Individual Time: 1300-1414 OT Individual Time Calculation (min): 74 min    Short Term Goals: Week 1:  OT Short Term Goal 1 (Week 1): patient will roll in bed with min A, move to sitting and lying positions with min a OT Short Term Goal 2 (Week 1): patient will complete upper body bathing and dressing with min A OT Short Term Goal 3 (Week 1): patient will use right UE as gross stabilizer with mod cues and set up OT Short Term Goal 4 (Week 1): patient will sit unsupported with CS and complete sit pivot transfers with mod A  Skilled Therapeutic Interventions/Progress Updates:    1:1. Pt received in bed awake and agreeable to OT. RN unhooks IV and OT attatches foley to foley anchor. Pt completes MOD A squat pivots to L throughout session with VC for scooting to EOB, Lknee block and hand placement. Pt completes UB bathing with trace shoulder elevation when washing R armpit. HOH A to wash LUE with RUE for NMR. Pt completes UB dressing with S/VC for doffing/R attention, and MIN A to thread RUE to don and tactile cues for pushing shirt over R shoulder. Pt with poor visual scanning as eyes dont track past R of midline requring head turns to locate objects in R visual field/sink. Pt completes ant/post and lateral reaching at [facility] for trunk control as well as RUE WB forearm into 6" box while pt sorts pegs into colors with 50% accuracy. Increased errors with pegs in farther R visual field and CGA for sitting balance. Exited session with pt seated in bed, exit alarm on and call light in reach   Therapy Documentation Precautions:  Precautions Precautions: Fall Precaution Comments: aphasia Restrictions Weight Bearing Restrictions: No General:   Vital Signs: Therapy Vitals Temp: 98.1 F (36.7 C) Pulse Rate: 60 BP: 114/80 Patient Position (if  appropriate): Lying Oxygen Therapy SpO2: 97 % O2 Device: Room Air Pain:   ADL: ADL Eating: Moderate assistance Where Assessed-Eating: Wheelchair Grooming: Moderate assistance Where Assessed-Grooming: Wheelchair Upper Body Bathing: Maximal assistance Where Assessed-Upper Body Bathing: Wheelchair Lower Body Bathing: Maximal assistance Where Assessed-Lower Body Bathing: Bed level Upper Body Dressing: Maximal assistance Where Assessed-Upper Body Dressing: Wheelchair Lower Body Dressing: Dependent Where Assessed-Lower Body Dressing: Bed level Toileting: Dependent Where Assessed-Toileting: Bed level ADL Comments: sit pivot transfer bed to/from w/c max A Vision   Perception    Praxis   Exercises:   Other Treatments:     Therapy/Group: Individual Therapy  Shon Hale 05/19/2020, 6:54 AM

## 2020-05-19 NOTE — Progress Notes (Incomplete)
[facility] PHYSICAL MEDICINE & REHABILITATION PROGRESS NOTE   Subjective/Complaints:  RN had difficulty placing foley after I/O cath yielded resistance, urology consult appreciated, place with sensor wire, to remain in place at least 7d  ROS- aphasia   Objective:   No results found. Recent Labs    05/18/20 0331  WBC 11.7*  HGB 15.7  HCT 46.2  PLT 347   Recent Labs    05/18/20 0331  NA 144  K 3.5  CL 105  CO2 28  GLUCOSE 165*  BUN 33*  CREATININE 1.20  CALCIUM 9.3    Intake/Output Summary (Last 24 hours) at 05/19/2020 0825 Last data filed at 05/19/2020 0500 Gross per 24 hour  Intake 0 ml  Output 600 ml  Net -600 ml        Physical Exam: Vital Signs Blood pressure 114/80, pulse 60, temperature 98.1 F (36.7 C), resp. rate 17, height 5\' 6"  (1.676 m), weight 81.9 kg, SpO2 97 %.  General: No acute distress Mood and affect are appropriate Heart: Regular rate and rhythm no rubs murmurs or extra sounds Lungs: Clear to auscultation, breathing unlabored, no rales or wheezes Abdomen: Positive bowel sounds, soft nontender to palpation, nondistended Extremities: No clubbing, cyanosis, or edema Skin: No evidence of breakdown, no evidence of ras  Neurologic: Cranial nerves II through XII intact, motor strength is 5/5 in left and 0/5 right  deltoid, bicep, tricep, grip, hip flexor, knee extensors, ankle dorsiflexor and plantar flexor Sensory exam cannot assess due to aphasia  Cerebellar exam cannot perform on Right due to weakness Musculoskeletal: Full range of motion in all 4 extremities. No joint swelling   Assessment/Plan: 1. Functional deficits which require 3+ hours per day of interdisciplinary therapy in a comprehensive inpatient rehab setting.  Physiatrist is providing close team supervision and 24 hour management of active medical problems listed below.  Physiatrist and rehab team continue to assess barriers to discharge/monitor patient progress toward  functional and medical goals  Care Tool:  Bathing    Body parts bathed by patient: Chest,Abdomen,Front perineal area,Right upper leg,Left upper leg,Face   Body parts bathed by helper: Right arm,Left arm,Chest,Front perineal area,Buttocks,Right upper leg,Left upper leg,Right lower leg,Left lower leg     Bathing assist Assist Level: Maximal Assistance - Patient 24 - 49%     Upper Body Dressing/Undressing Upper body dressing   What is the patient wearing?: Pull over shirt    Upper body assist Assist Level: Maximal Assistance - Patient 25 - 49%    Lower Body Dressing/Undressing Lower body dressing      What is the patient wearing?: Pants,Incontinence brief     Lower body assist Assist for lower body dressing: Total Assistance - Patient < 25%     Toileting Toileting    Toileting assist Assist for toileting: Dependent - Patient 0%     Transfers Chair/bed transfer  Transfers assist     Chair/bed transfer assist level: Maximal Assistance - Patient 25 - 49%     Locomotion Ambulation   Ambulation assist      Assist level: 2 helpers Assistive device: Hand held assist Max distance: 10   Walk 10 feet activity   Assist     Assist level: 2 helpers Assistive device: Hand held assist   Walk 50 feet activity   Assist Walk 50 feet with 2 turns activity did not occur: Safety/medical concerns  Assist level:  (R ankle instability limits assessment)      Walk 150 feet activity  Assist Walk 150 feet activity did not occur: Safety/medical concerns         Walk 10 feet on uneven surface  activity   Assist Walk 10 feet on uneven surfaces activity did not occur: Safety/medical concerns         Wheelchair     Assist Will patient use wheelchair at discharge?: Yes Type of Wheelchair: Manual    Wheelchair assist level: Maximal Assistance - Patient 25 - 49% Max wheelchair distance: 39ft    Wheelchair 50 feet with 2 turns  activity    Assist        Assist Level: Total Assistance - Patient < 25%   Wheelchair 150 feet activity     Assist      Assist Level: Total Assistance - Patient < 25%   Blood pressure 114/80, pulse 60, temperature 98.1 F (36.7 C), resp. rate 17, height 5\' 6"  (1.676 m), weight 81.9 kg, SpO2 97 %.    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 -patient may  shower -ELOS/Goals: sup/minA, 18-21d 2. Antithrombotics: -DVT/anticoagulation:Lovenox -antiplatelet therapy: Aspirin 325 mg daily and Plavix 75 mg daily x3 months then aspirin alone 3. Pain Management:All in all as needed 4. Mood:Provide emotional support -antipsychotic agents: N/A 5. Neuropsych: This patientis notcapable of making decisions on hisown behalf. 6. Skin/Wound Care:Routine skin checks 7. Fluids/Electrolytes/Nutrition:Routine in and outs with follow-up chemistries 8. Hypertension. Lisinopril 20 mg daily. Monitor with increased mobility Vitals:   05/18/20 1943 05/19/20 0503  BP: 123/87 114/80  Pulse: 67 60  Resp:    Temp: (!) 96.6 F (35.9 C) 98.1 F (36.7 C)  SpO2: 98% 97%  controlled 12/10  9. Dysphagia. Dysphagia #1 pudding thick liquid. Monitor hydration. Speech therapy follow-up 10. New findings diabetes mellitus. Hemoglobin A1c 8.7. Lantus insulin 10 units nightly. Diabetic teaching CBG (last 3)  Recent Labs    05/18/20 1644 05/18/20 2055 05/19/20 0616  GLUCAP 149* 146* 138*  increase lantus to 15U  11. Hyperlipidemia. Lipitor 12. Ascending aortic aneurysm with dilation 4.1 cm. Recommendations annual imaging followed by CTA or MRA. 13.  Pre renal azotemia, IVF at noc repeat BMET later in week  14.  Leukocytosis , afeb check UA C and S  15.  Urinary retention appreciate uro  assist LOS: 4 days A FACE TO FACE EVALUATION WAS PERFORMED  14/10/21 05/19/2020, 8:25 AM

## 2020-05-20 ENCOUNTER — Inpatient Hospital Stay (HOSPITAL_COMMUNITY): Payer: BLUE CROSS/BLUE SHIELD | Admitting: Occupational Therapy

## 2020-05-20 ENCOUNTER — Inpatient Hospital Stay (HOSPITAL_COMMUNITY): Payer: BLUE CROSS/BLUE SHIELD

## 2020-05-20 ENCOUNTER — Inpatient Hospital Stay (HOSPITAL_COMMUNITY): Payer: BLUE CROSS/BLUE SHIELD | Admitting: Physical Therapy

## 2020-05-20 LAB — GLUCOSE, CAPILLARY
Glucose-Capillary: 147 mg/dL — ABNORMAL HIGH (ref 70–99)
Glucose-Capillary: 99 mg/dL (ref 70–99)
Glucose-Capillary: 128 mg/dL — ABNORMAL HIGH (ref 70–99)
Glucose-Capillary: 115 mg/dL — ABNORMAL HIGH (ref 70–99)

## 2020-05-20 NOTE — Progress Notes (Signed)
Speech Language Pathology Daily Session Note  Patient Details  Name: Aaron Weiss MRN: [id number] Date of Birth: 07-03-1965  Today's Date: 05/20/2020 SLP Individual Time: 0917-1000 SLP Individual Time Calculation (min): 43 min  Short Term Goals: Week 1: SLP Short Term Goal 1 (Week 1): Pt will consume trials of honey thick liquids with minimal overt s/sx aspiration X2 prior to advancement or repeat testing. SLP Short Term Goal 2 (Week 1): Pt will consume trials of Dys 2 textures across at least 2 sessions with efficient mastication and oral clearance with no more than Min A cues for use of compensatory swallow strategies prior to advancement. SLP Short Term Goal 3 (Week 1): Pt will name functional common objects with 50% accuracy with Max A multimodal cues. SLP Short Term Goal 4 (Week 1): Pt will demonstrate ability to communiate basic needs (ex: bathroom, hungry, etc.) via gestures and/or other multimodal means with Mod A cues. SLP Short Term Goal 5 (Week 1): Pt will match words to objects with 50% accuracy with Max A multimodal cues. SLP Short Term Goal 6 (Week 1): Pt will demonstrate awareness of verbal errors in 7/10 opportunities with Mod A multimodal cues.  Skilled Therapeutic Interventions:Skilled ST services focused on language skills. Pt demonstrated verbal greetings, response to yes/no questions with mod A verbal cues to increase speech intelligibility (open mouth) and one word response when given two choices ( ex" door open or closed? "open.")  SLP facilitated expressive naming of common objects in [facility] toolkit naming 5 out 10 with initial phonetic CV sounds and 10/10 with ability to repeat with mod A fade to min A phonetic errors. Pt demonstrated ability match word to object in a field of 2 in 7 out 10 opportunities. SLP facilitated functional witting, pt was able to write first name with initial letter cue fading to underline spaces only. Pt was left in room with call bell within  reach and bed alarm set. SLP recommends to continue skilled services.     Pain Pain Assessment Pain Score: 0-No pain  Therapy/Group: Individual Therapy  Tresten Pantoja  [facility] 05/20/2020, 12:33 PM

## 2020-05-20 NOTE — Progress Notes (Signed)
Physical Therapy Session Note  Patient Details  Name: Aaron Weiss MRN: [id number] Date of Birth: 10-09-1965  Today's Date: 05/20/2020 PT Individual Time: 4098-1191 1300-1355 PT Individual Time Calculation (min): 40 min 55 min   Short Term Goals: Week 1:  PT Short Term Goal 1 (Week 1): Pt will perform wc to/from bed w/mod assist to R, max assist of 1 to L PT Short Term Goal 2 (Week 1): sit to stand w/LRAD and mod assist PT Short Term Goal 3 (Week 1): gait x 23f w/LRAD and mod assist of 2, appropriate bracing/wrapping of R ankle PT Short Term Goal 4 (Week 1): pt will maintain sitting balance w/cross body reaching tasks w/min assist  Skilled Therapeutic Interventions/Progress Updates:   Pt received supine in bed and agreeable to PT. PT assisted pt to perform lower body dressing at bed level with mod assist and bridge to pull pants to waisst with RLE stabilized into knee flexion. shoes and AFO donned supine. Supine>sit with mod assist to control the RLE and RUE. [facility]at [facility] and RLE blocked.   WC mobility with min assist for direction x 106fwith moderate cues for improved use of LLE to prevent veer to the R.   Sit<>stand with RW and mod assist with the RLE stabilized and RUE supported in brace. Static standing balance with mod-max assist to prevent RLE buckling. PT provided mirror with visual feedback and able to maintain balance with RW and min-mod assist. pregait stepping forward/back x 8 BLE with mod assist to the RLE and cues to attend to mirror for midline orientation.   Gait training with RW x 1082fith max assist to advance/stabilize the RLE and +2 for WC follow. Pt able to advance in swing and extend in stance 25% of steps.   Pt returned to room and performed squat transfer to bed with mod assist and RLE blocked. Sit>supine completed with mod assist for RLE and trunk support. Pt  left supine in bed with call bell in reach and all needs met.     Session 2.  Pt received supine in bed and agreeable to PT. Supine>sit transfer with mod assist and moderate cues for attention to the RLE and RUE.   PT assisted pt to don shoes and RAFO sitting EOB with min assist for sitting balance to prevent LOB to the R. Squat pivot [facility] and RLE blocked.   Pt instructed in WC mobility with min-mod assist moderate cues for improved use of LLE to control turns to the L.   Stand pivot transfer [facility] and max assist for safety and RLE positioning as well as control RW. Seated trunkal NMR to perform forward and lateral reaches R and L with BUE on therapy ball 2 x 10 in all 3 directions with mod assist to stabilize the RUE. Reciprocal scooting forward and reverse x 2 each direction with mod-max assist for improved control of the R hip.   Blocked practice sit<>stand with RW and visual feedback with mirror x 10 with mod assist for symmetrical WB and to prevent buckling in the RLE. Gait training with RW x 51f8fth max assist +2 for WC follow and visual feedback from mirror. Pt able to advance RLE and initiate extension on the RLE 75% of steps for this session. Noted step to gait pattern for safety, ER of the R hip and mild R/forward LOB.   nustep reciprocal movement training 2  min x 2, level 4, with 1 therapeutic rest break with BLE only and R thigh support. Pt able to initiate hip/knee extension on the R with min assist progressing to supervision assist throughout.   Pt returned to room and performed squat pivot transfer to bed with mod assist and RAFO. Sit>supine completed with mod assist for safety and control of RLE/RUE, and left supine in bed with call bell in reach and all needs met.            Therapy Documentation Precautions:  Precautions Precautions: Fall Precaution Comments: aphasia Restrictions Weight Bearing Restrictions: No Vital Signs: Therapy Vitals Temp: 97.8 F (36.6 C) Pulse Rate: 64 BP:  127/80 Patient Position (if appropriate): Lying Oxygen Therapy SpO2: 100 % O2 Device: Room Air Pain: denies   Therapy/Group: Individual Therapy  Lorie Phenix 05/20/2020, 8:41 AM

## 2020-05-20 NOTE — Progress Notes (Signed)
[place] PHYSICAL MEDICINE & REHABILITATION PROGRESS NOTE   Subjective/Complaints: Sleeping soundly this morning  ROS- aphasia  Objective:   DG Swallowing Func-Speech Pathology  Result Date: 05/19/2020 Objective Swallowing Evaluation: Type of Study: MBS-Modified Barium Swallow Study  Patient Details Name: Aaron Weiss MRN: [id number] Date of Birth: 12/27/1965 Today's Date: 05/19/2020 Past Medical History: No past medical history on file. Past Surgical History: Past Surgical History: Procedure Laterality Date . BUBBLE STUDY  05/10/2020  Procedure: BUBBLE STUDY;  Surgeon: Jake Bathe, MD;  Location: [facility] ENDOSCOPY;  Service: Cardiovascular;; . TEE WITHOUT CARDIOVERSION N/A 05/10/2020  Procedure: TRANSESOPHAGEAL ECHOCARDIOGRAM (TEE);  Surgeon: Jake Bathe, MD;  Location: [facility] ENDOSCOPY;  Service: Cardiovascular;  Laterality: N/A; HPI: 54 y.o. Caucasian male with PMH of HTN but not seeing doctors for years presented to ED 05/06/20 as code stroke. NIHSS 6 with global partial aphasia; CT head chronic left cerebellar infarct, subacute/chronic left superior parietal infarct, subacute left parietal infarct, acute left frontal infarct. 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 Pt acutely developed right side paresis, facial droop and CT revealed extension of stroke. Pt admitted to [facility] 05/15/20.  Assessment / Plan / Recommendation CHL IP CLINICAL IMPRESSIONS 05/19/2020 Clinical Impression Pt continues to present with moderately severe oropharyngeal dysphagia, however noteworthy functional improvement in swallow function was visualized during repeat MBSS today (in comparison to initial MBSS on 05/09/20). Pt exhibits significant weakness of facial and oral musculature, which results in anterior spillage, labial residue, weak lingual manipulation of boluses, and decreased oral control of thinner boluses. Oral phase deficits resulted in premature spillage of nectar thick barium into the laryngeal vestibule,  which was aspirated during the swallow without any sensation (PAS score 8). Pt's volitional cough was weak and was ineffective to clear aspirate when cued by SLP. Improved oral control of honey thick liquids noted today. His swallow initial of honey thick barium occurred most consistently at the vallecular sinuses, or just slightly past. Timing of of epiglottic deflection is still mildly delayed, however pharyngeal stripping and overall effectiveness of airway protection is improved. No aspiration noted throughout several honey thick barium trials via cup and tsp. A small amount of honey did penetrate above the level of the vocal folds X1 (PAS score 2), however during next set of imaging, the vestibule was clear of penetrates.  Recommend pt continue pureed solid textures (dys 1), upgrade to honey thick liquids, continue medications crushed in applesauce or pudding and full supervision to ensure safe swallow strategies. Pt may take some self-fed small cup sips of honey liquids, however intake was most efficient when boluses were delivered via tsp - pt will need assistance with using tsp. ST will continue to provide skilled interventions in order to ensure diet safety and efficiency, as well as readiness for additional testing, advancement, and increasing independence with compensatory swallow strategies. SLP Visit Diagnosis Dysphagia, oropharyngeal phase (R13.12);Aphasia (R47.01) Attention and concentration deficit following -- Frontal lobe and executive function deficit following -- Impact on safety and function Moderate aspiration risk   CHL IP TREATMENT RECOMMENDATION 05/09/2020 Treatment Recommendations Therapy as outlined in treatment plan below   Prognosis 05/09/2020 Prognosis for Safe Diet Advancement Good Barriers to Reach Goals Severity of deficits Barriers/Prognosis Comment -- CHL IP DIET RECOMMENDATION 05/19/2020 SLP Diet Recommendations Dysphagia 1 (Puree) solids;Honey thick liquids Liquid Administration  via Spoon;Cup Medication Administration Crushed with puree Compensations Minimize environmental distractions;Slow rate;Small sips/bites;Lingual sweep for clearance of pocketing Postural Changes Remain semi-upright after after feeds/meals (Comment);Seated upright at  90 degrees   CHL IP OTHER RECOMMENDATIONS 05/19/2020 Recommended Consults -- Oral Care Recommendations Oral care BID Other Recommendations Order thickener from pharmacy;Remove water pitcher;Prohibited food (jello, ice cream, thin soups);Have oral suction available   CHL IP FOLLOW UP RECOMMENDATIONS 05/13/2020 Follow up Recommendations Inpatient Rehab   CHL IP FREQUENCY AND DURATION 05/09/2020 Speech Therapy Frequency (ACUTE ONLY) min 2x/week Treatment Duration 2 weeks      CHL IP ORAL PHASE 05/19/2020 Oral Phase Impaired Oral - Pudding Teaspoon -- Oral - Pudding Cup -- Oral - Honey Teaspoon Weak lingual manipulation;Right anterior bolus loss Oral - Honey Cup Right anterior bolus loss;Weak lingual manipulation;Decreased bolus cohesion Oral - Nectar Teaspoon NT Oral - Nectar Cup Weak lingual manipulation;Decreased bolus cohesion;Premature spillage;Right anterior bolus loss Oral - Nectar Straw NT Oral - Thin Teaspoon -- Oral - Thin Cup -- Oral - Thin Straw -- Oral - Puree WFL Oral - Mech Soft -- Oral - Regular -- Oral - Multi-Consistency -- Oral - Pill -- Oral Phase - Comment --  CHL IP PHARYNGEAL PHASE 05/19/2020 Pharyngeal Phase -- Pharyngeal- Pudding Teaspoon -- Pharyngeal -- Pharyngeal- Pudding Cup -- Pharyngeal -- Pharyngeal- Honey Teaspoon Delayed swallow initiation-vallecula;Reduced airway/laryngeal closure;Reduced tongue base retraction Pharyngeal Material does not enter airway Pharyngeal- Honey Cup Delayed swallow initiation-vallecula;Reduced airway/laryngeal closure;Penetration/Aspiration during swallow;Reduced tongue base retraction Pharyngeal Material enters airway, remains ABOVE vocal cords then ejected out Pharyngeal- Nectar Teaspoon --  Pharyngeal -- Pharyngeal- Nectar Cup Delayed swallow initiation-pyriform sinuses;Reduced airway/laryngeal closure;Penetration/Aspiration before swallow;Penetration/Aspiration during swallow;Reduced tongue base retraction Pharyngeal Material enters airway, passes BELOW cords without attempt by patient to eject out (silent aspiration) Pharyngeal- Nectar Straw -- Pharyngeal -- Pharyngeal- Thin Teaspoon -- Pharyngeal -- Pharyngeal- Thin Cup -- Pharyngeal -- Pharyngeal- Thin Straw -- Pharyngeal -- Pharyngeal- Puree WFL Pharyngeal -- Pharyngeal- Mechanical Soft -- Pharyngeal -- Pharyngeal- Regular -- Pharyngeal -- Pharyngeal- Multi-consistency -- Pharyngeal -- Pharyngeal- Pill -- Pharyngeal -- Pharyngeal Comment --  CHL IP CERVICAL ESOPHAGEAL PHASE 05/19/2020 Cervical Esophageal Phase WFL Pudding Teaspoon -- Pudding Cup -- Honey Teaspoon -- Honey Cup -- Nectar Teaspoon -- Nectar Cup -- Nectar Straw -- Thin Teaspoon -- Thin Cup -- Thin Straw -- Puree -- Mechanical Soft -- Regular -- Multi-consistency -- Pill -- Cervical Esophageal Comment -- [name] 05/19/2020, 9:54 AM              Recent Labs    05/18/20 0331  WBC 11.7*  HGB 15.7  HCT 46.2  PLT 347   Recent Labs    05/18/20 0331  NA 144  K 3.5  CL 105  CO2 28  GLUCOSE 165*  BUN 33*  CREATININE 1.20  CALCIUM 9.3    Intake/Output Summary (Last 24 hours) at 05/20/2020 1246 Last data filed at 05/20/2020 1127 Gross per 24 hour  Intake 142 ml  Output 925 ml  Net -783 ml        Physical Exam: Vital Signs Blood pressure 127/80, pulse 64, temperature 97.8 F (36.6 C), resp. rate 18, height 5\' 6"  (1.676 m), weight 81.9 kg, SpO2 100 %. Gen: no distress, normal appearing HEENT: oral mucosa pink and moist, NCAT Cardio: Reg rate Chest: normal effort, normal rate of breathing Abd: soft, non-distended Ext: no edema Skin: intact Neurologic: Cranial nerves II through XII intact, motor strength is 5/5 in left and 0/5 right  deltoid, bicep,  tricep, grip, hip flexor, knee extensors, ankle dorsiflexor and plantar flexor Sensory exam cannot assess due to aphasia  Cerebellar exam cannot perform on Right  due to weakness Musculoskeletal: Full range of motion in all 4 extremities. No joint swelling   Assessment/Plan: 1. Functional deficits which require 3+ hours per day of interdisciplinary therapy in a comprehensive inpatient rehab setting.  Physiatrist is providing close team supervision and 24 hour management of active medical problems listed below.  Physiatrist and rehab team continue to assess barriers to discharge/monitor patient progress toward functional and medical goals  Care Tool:  Bathing    Body parts bathed by patient: Chest,Abdomen,Front perineal area,Right upper leg,Left upper leg,Face   Body parts bathed by helper: Right arm,Left arm,Chest,Front perineal area,Buttocks,Right upper leg,Left upper leg,Right lower leg,Left lower leg     Bathing assist Assist Level: Maximal Assistance - Patient 24 - 49%     Upper Body Dressing/Undressing Upper body dressing   What is the patient wearing?: Pull over shirt    Upper body assist Assist Level: Maximal Assistance - Patient 25 - 49%    Lower Body Dressing/Undressing Lower body dressing      What is the patient wearing?: Pants,Incontinence brief     Lower body assist Assist for lower body dressing: Total Assistance - Patient < 25%     Toileting Toileting    Toileting assist Assist for toileting: Dependent - Patient 0%     Transfers Chair/bed transfer  Transfers assist     Chair/bed transfer assist level: Maximal Assistance - Patient 25 - 49%     Locomotion Ambulation   Ambulation assist      Assist level: 2 helpers Assistive device: Hand held assist Max distance: 10   Walk 10 feet activity   Assist     Assist level: 2 helpers Assistive device: Hand held assist   Walk 50 feet activity   Assist Walk 50 feet with 2 turns activity  did not occur: Safety/medical concerns  Assist level:  (R ankle instability limits assessment)      Walk 150 feet activity   Assist Walk 150 feet activity did not occur: Safety/medical concerns         Walk 10 feet on uneven surface  activity   Assist Walk 10 feet on uneven surfaces activity did not occur: Safety/medical concerns         Wheelchair     Assist Will patient use wheelchair at discharge?: Yes Type of Wheelchair: Manual    Wheelchair assist level: Maximal Assistance - Patient 25 - 49% Max wheelchair distance: 68ft    Wheelchair 50 feet with 2 turns activity    Assist        Assist Level: Total Assistance - Patient < 25%   Wheelchair 150 feet activity     Assist      Assist Level: Total Assistance - Patient < 25%   Blood pressure 127/80, pulse 64, temperature 97.8 F (36.6 C), resp. rate 18, height 5\' 6"  (1.676 m), weight 81.9 kg, SpO2 100 %.    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 -patient may  shower -ELOS/Goals: sup/minA, 18-21d  -Continue CIR 2. Antithrombotics: -DVT/anticoagulation:Lovenox -antiplatelet therapy: Aspirin 325 mg daily and Plavix 75 mg daily x3 months then aspirin alone 3. Pain Management:Tylenol as needed 4. Mood:Provide emotional support -antipsychotic agents: N/A 5. Neuropsych: This patientis notcapable of making decisions on hisown behalf. 6. Skin/Wound Care:Routine skin checks 7. Fluids/Electrolytes/Nutrition:Routine in and outs with follow-up chemistries 8. Hypertension. Lisinopril 20 mg daily. Monitor with increased mobility  Vitals:   05/19/20 1957 05/20/20 0441  BP: 114/78 127/80  Pulse: 69 64  Resp:    Temp: 97.8 F (36.6 C) 97.8 F (36.6 C)  SpO2: 97% 100%  controlled 12/11: continue to monitor  TID  9. Dysphagia. Change to Dysphagia #1, honey thick liquids s/p MBS 10. New findings diabetes mellitus. Hemoglobin A1c 8.7. Lantus insulin 10 units nightly. Diabetic teaching CBG (last 3)  Recent Labs    05/19/20 2116 05/20/20 0548 05/20/20 1134  GLUCAP 137* 147* 99  increase lantus to 15U  12/11: CBGs ranging from 99-147, continue to monitor AC/HS.  11. Hyperlipidemia. Lipitor 12. Ascending aortic aneurysm with dilation 4.1 cm. Recommendations annual imaging followed by CTA or MRA. 13.  Pre renal azotemia, IVF at noc repeat BMET later in week  14.  Leukocytosis , afeb, UA negative for infection. UC in process LOS: 5 days A FACE TO FACE EVALUATION WAS PERFORMED  Drema PryKrutika P Leianna Barga 05/20/2020, 12:46 PM

## 2020-05-20 NOTE — Plan of Care (Signed)
  Problem: RH SKIN INTEGRITY Goal: RH STG SKIN FREE OF INFECTION/BREAKDOWN Description: Skin to remain intact, prevent excoriation with min assist Outcome: Progressing   Problem: RH KNOWLEDGE DEFICIT Goal: RH STG INCREASE KNOWLEDGE OF DYSPHAGIA/FLUID INTAKE Description: Patient/caregiver will be able to verbalize safe swallowing techniques and types of food/fluid appropriate for patient with cues/handouts Outcome: Progressing Goal: RH STG INCREASE KNOWLEDGE OF STROKE PROPHYLAXIS Description: Patient/family will be able to verbalize prevention strategies against stroke with cues/handouts including medications, diet, and lifestyle changes. Outcome: Progressing

## 2020-05-20 NOTE — Progress Notes (Signed)
Occupational Therapy Session Note  Patient Details  Name: Aaron Weiss MRN: [id number] Date of Birth: 1966-03-15  Today's Date: 05/20/2020 OT Individual Time: 1430-1530 OT Individual Time Calculation (min): 60 min    Short Term Goals: Week 1:  OT Short Term Goal 1 (Week 1): patient will roll in bed with min A, move to sitting and lying positions with min a OT Short Term Goal 2 (Week 1): patient will complete upper body bathing and dressing with min A OT Short Term Goal 3 (Week 1): patient will use right UE as gross stabilizer with mod cues and set up OT Short Term Goal 4 (Week 1): patient will sit unsupported with CS and complete sit pivot transfers with mod A  Skilled Therapeutic Interventions/Progress Updates:    Pt received in bed with his son present. Pt's son is a PT tech at an outpt clinic and he is not able to visit frequently, but when he does he can work on external rotation PROM and elbow PROM.  Demonstrated to son while working with pt in supine how to move pt into these stretches with caution to the shoulder joint.    Pt then sat to EOB with mod A  - focus on rotation of torso to assist pt with coming to sit.  In sitting (for 45 min) pt maintained balance with just Supervision. Worked on RUE NMF with PROM table top towel slides.  Trace bicep movement elicited.  Much of the session focused on forward wt shift with hip lift for squat pivot transfers. Worked on moving on the bed and then squat pivots 2x to the drop arm BSC.  Pt shook head no when asked if he would like to try to toilet (he has a urinary catheter).    With the son providing +2 A for safety, pt rose to stand with mod A of 1 with support on R side eliciting R quads.   Pt expressed he was very tired. Moved back to supine.  He declined trying a sidelying position.  Positioned with pillows to support R arm.   Pt with alarm on and all needs met.   Therapy Documentation Precautions:  Precautions Precautions:  Fall Precaution Comments: aphasia Restrictions Weight Bearing Restrictions: No  Pain: Pain Assessment Pain Score: 0-No pain    Therapy/Group: Individual Therapy  [place] 05/20/2020, 5:10 PM

## 2020-05-21 LAB — GLUCOSE, CAPILLARY
Glucose-Capillary: 90 mg/dL (ref 70–99)
Glucose-Capillary: 91 mg/dL (ref 70–99)
Glucose-Capillary: 125 mg/dL — ABNORMAL HIGH (ref 70–99)
Glucose-Capillary: 151 mg/dL — ABNORMAL HIGH (ref 70–99)

## 2020-05-21 LAB — URINE CULTURE: Culture: 20000 — AB

## 2020-05-21 MED ORDER — NITROFURANTOIN MONOHYD MACRO 100 MG PO CAPS
100.0000 mg | ORAL_CAPSULE | Freq: Two times a day (BID) | ORAL | Status: AC
  Administered 2020-05-21 – 2020-05-27 (×12): 100 mg via ORAL
  Filled 2020-05-21 (×12): qty 1

## 2020-05-21 NOTE — Plan of Care (Signed)
  Problem: RH SKIN INTEGRITY Goal: RH STG SKIN FREE OF INFECTION/BREAKDOWN Description: Skin to remain intact, prevent excoriation with min assist Outcome: Progressing   Problem: RH BOWEL ELIMINATION Goal: RH STG MANAGE BOWEL WITH ASSISTANCE Description: STG Manage Bowel with Assistance. min Outcome: Progressing

## 2020-05-21 NOTE — Progress Notes (Signed)
[place] PHYSICAL MEDICINE & REHABILITATION PROGRESS NOTE   Subjective/Complaints: Feeling well No pain  Sleeping well  Had a BM this morning  ROS- denies pain  Objective:   No results found. No results for input(s): WBC, HGB, HCT, PLT in the last 72 hours. No results for input(s): NA, K, CL, CO2, GLUCOSE, BUN, CREATININE, CALCIUM in the last 72 hours.  Intake/Output Summary (Last 24 hours) at 05/21/2020 1544 Last data filed at 05/21/2020 0748 Gross per 24 hour  Intake 15 ml  Output 475 ml  Net -460 ml    Physical Exam: Vital Signs Blood pressure 138/85, pulse 64, temperature 98.7 F (37.1 C), resp. rate 19, height 5\' 6"  (1.676 m), weight 81.9 kg, SpO2 96 %. Gen: no distress, normal appearing HEENT: oral mucosa pink and moist, NCAT Cardio: Reg rate Chest: normal effort, normal rate of breathing Abd: soft, non-distended Ext: no edema Skin: intact Neurologic: Cranial nerves II through XII intact, motor strength is 5/5 in left and 0/5 right  deltoid, bicep, tricep, grip, hip flexor, knee extensors, ankle dorsiflexor and plantar flexor Sensory exam cannot assess due to aphasia  Cerebellar exam cannot perform on Right due to weakness Musculoskeletal: Full range of motion in all 4 extremities. No joint swelling   Assessment/Plan: 1. Functional deficits which require 3+ hours per day of interdisciplinary therapy in a comprehensive inpatient rehab setting.  Physiatrist is providing close team supervision and 24 hour management of active medical problems listed below.  Physiatrist and rehab team continue to assess barriers to discharge/monitor patient progress toward functional and medical goals  Care Tool:  Bathing    Body parts bathed by patient: Chest,Abdomen,Front perineal area,Right upper leg,Left upper leg,Face   Body parts bathed by helper: Right arm,Left arm,Chest,Front perineal area,Buttocks,Right upper leg,Left upper leg,Right lower leg,Left lower leg      Bathing assist Assist Level: Maximal Assistance - Patient 24 - 49%     Upper Body Dressing/Undressing Upper body dressing   What is the patient wearing?: Pull over shirt    Upper body assist Assist Level: Maximal Assistance - Patient 25 - 49%    Lower Body Dressing/Undressing Lower body dressing      What is the patient wearing?: Pants,Incontinence brief     Lower body assist Assist for lower body dressing: Total Assistance - Patient < 25%     Toileting Toileting    Toileting assist Assist for toileting: Dependent - Patient 0%     Transfers Chair/bed transfer  Transfers assist     Chair/bed transfer assist level: Maximal Assistance - Patient 25 - 49%     Locomotion Ambulation   Ambulation assist      Assist level: 2 helpers Assistive device: Hand held assist Max distance: 10   Walk 10 feet activity   Assist     Assist level: 2 helpers Assistive device: Hand held assist   Walk 50 feet activity   Assist Walk 50 feet with 2 turns activity did not occur: Safety/medical concerns  Assist level:  (R ankle instability limits assessment)      Walk 150 feet activity   Assist Walk 150 feet activity did not occur: Safety/medical concerns         Walk 10 feet on uneven surface  activity   Assist Walk 10 feet on uneven surfaces activity did not occur: Safety/medical concerns         Wheelchair     Assist Will patient use wheelchair at discharge?: Yes Type of Wheelchair:  Manual    Wheelchair assist level: Maximal Assistance - Patient 25 - 49% Max wheelchair distance: 12ft    Wheelchair 50 feet with 2 turns activity    Assist        Assist Level: Total Assistance - Patient < 25%   Wheelchair 150 feet activity     Assist      Assist Level: Total Assistance - Patient < 25%   Blood pressure 138/85, pulse 64, temperature 98.7 F (37.1 C), resp. rate 19, height 5\' 6"  (1.676 m), weight 81.9 kg, SpO2 96 %.    Medical  Problem List and Plan: 1.Altered mental status with aphasia and decreased functional mobilitysecondary to left MCA infarct due to left ICA and M2 branch occlusion. -patient may  shower -ELOS/Goals: sup/minA, 18-21d  -Continue CIR 2. Antithrombotics: -DVT/anticoagulation:Lovenox -antiplatelet therapy: Aspirin 325 mg daily and Plavix 75 mg daily x3 months then aspirin alone 3. Pain Management:Tylenol as needed 4. Mood:Provide emotional support -antipsychotic agents: N/A 5. Neuropsych: This patientis notcapable of making decisions on hisown behalf. 6. Skin/Wound Care:Routine skin checks 7. Fluids/Electrolytes/Nutrition:Routine in and outs with follow-up chemistries 8. Hypertension. Lisinopril 20 mg daily. Monitor with increased mobility Vitals:   05/21/20 0440 05/21/20 1526  BP: 138/74 138/85  Pulse: (!) 56 64  Resp:  19  Temp: 98 F (36.7 C) 98.7 F (37.1 C)  SpO2: 97% 96%  controlled 12/12: monitor TID 9. Dysphagia. Change to Dysphagia #1, honey thick liquids s/p MBS 10. New findings diabetes mellitus. Hemoglobin A1c 8.7. Lantus insulin 10 units nightly. Diabetic teaching CBG (last 3)  Recent Labs    05/20/20 2106 05/21/20 0551 05/21/20 1154  GLUCAP 115* 90 91  increase lantus to 15U  12/12: CBGs elevated to 115 last night, continue to monitor AC/HS.  11. Hyperlipidemia. Lipitor 12. Ascending aortic aneurysm with dilation 4.1 cm. Recommendations annual imaging followed by CTA or MRA. 13.  Pre renal azotemia, IVF at noc repeat BMET later in week  14.  Leukocytosis , afeb, UC with 20,000 colonies of enterococcus faecalis, sensitive to nitrofurantion, started.  LOS: 6 days A FACE TO FACE EVALUATION WAS PERFORMED  14/12/21 Aaron Weiss 05/21/2020, 3:44 PM

## 2020-05-22 ENCOUNTER — Inpatient Hospital Stay (HOSPITAL_COMMUNITY): Payer: BLUE CROSS/BLUE SHIELD

## 2020-05-22 ENCOUNTER — Inpatient Hospital Stay (HOSPITAL_COMMUNITY): Payer: BLUE CROSS/BLUE SHIELD | Admitting: Speech Pathology

## 2020-05-22 ENCOUNTER — Inpatient Hospital Stay (HOSPITAL_COMMUNITY): Payer: BLUE CROSS/BLUE SHIELD | Admitting: Occupational Therapy

## 2020-05-22 LAB — GLUCOSE, CAPILLARY
Glucose-Capillary: 110 mg/dL — ABNORMAL HIGH (ref 70–99)
Glucose-Capillary: 174 mg/dL — ABNORMAL HIGH (ref 70–99)
Glucose-Capillary: 150 mg/dL — ABNORMAL HIGH (ref 70–99)
Glucose-Capillary: 103 mg/dL — ABNORMAL HIGH (ref 70–99)

## 2020-05-22 LAB — CREATININE, SERUM
Creatinine, Ser: 0.9 mg/dL (ref 0.61–1.24)
GFR, Estimated: >60 mL/min (ref >60)

## 2020-05-22 NOTE — Progress Notes (Signed)
Occupational Therapy Session Note  Patient Details  Name: Aaron Weiss MRN: [id number] Date of Birth: 01-Jun-1966  Today's Date: 05/22/2020 OT Individual Time: 6948-5462 OT Individual Time Calculation (min): 70 min   Short Term Goals: Week 1:  OT Short Term Goal 1 (Week 1): patient will roll in bed with min A, move to sitting and lying positions with min a OT Short Term Goal 2 (Week 1): patient will complete upper body bathing and dressing with min A OT Short Term Goal 3 (Week 1): patient will use right UE as gross stabilizer with mod cues and set up OT Short Term Goal 4 (Week 1): patient will sit unsupported with CS and complete sit pivot transfers with mod A  Skilled Therapeutic Interventions/Progress Updates:    Pt greeted in the w/c with no c/o pain. Per pt, he had already showered today. Per RN, pt had just transferred off of the toilet about 20 minutes ago. Therefore tx focus was placed on Rt NMR with pt participating in grasp/release and reaching activities with Saebo Stim-One applied to forearm extensors given max facilitation. He tolerated e-stim for ~27 minutes, skin intact once pads were removed. E-stim parameters are listed below. Pt participated in additional NMR activities involving active assist ROM and Rt sided weightbearing with the Rt armrest removed. Pt required [facility] facilitation for all self ROM and manual cuing for WB techniques. At end of session pt completed a squat pivot<bed with Mod A going towards the Rt side with pt losing his balance posteriorly post transfer. Max A for returning to supine. Pt was repositioned to protect hemiplegic side and was left with all needs within reach and bed alarm set.   Saebo Stim One 330 pulse width 35 Hz pulse rate On 8 sec/ off 8 sec Ramp up/ down 2 sec Symmetrical Biphasic wave form  Max intensity at 500 Ohm load   Therapy Documentation Precautions:  Precautions Precautions: Fall Precaution Comments:  aphasia Restrictions Weight Bearing Restrictions: No Vital Signs: Therapy Vitals Temp: 97.8 F (36.6 C) Pulse Rate: 77 Resp: 16 BP: (!) 121/93 Patient Position (if appropriate): Sitting Oxygen Therapy SpO2: 100 % O2 Device: Room Air ADL: ADL Eating: Moderate assistance Where Assessed-Eating: Wheelchair Grooming: Moderate assistance Where Assessed-Grooming: Wheelchair Upper Body Bathing: Maximal assistance Where Assessed-Upper Body Bathing: Wheelchair Lower Body Bathing: Maximal assistance Where Assessed-Lower Body Bathing: Bed level Upper Body Dressing: Maximal assistance Where Assessed-Upper Body Dressing: Wheelchair Lower Body Dressing: Dependent Where Assessed-Lower Body Dressing: Bed level Toileting: Dependent Where Assessed-Toileting: Bed level ADL Comments: sit pivot transfer bed to/from w/c max A      Therapy/Group: Individual Therapy  Jonatan Wilsey A Chadley Dziedzic 05/22/2020, 4:14 PM

## 2020-05-22 NOTE — Progress Notes (Signed)
Physical Therapy Session Note  Patient Details  Name: Aaron Weiss MRN: [id number] Date of Birth: 06/01/1966  Today's Date: 05/22/2020 PT Individual Time: 1100-1200 PT Individual Time Calculation (min): 60 min   Short Term Goals: Week 1:  PT Short Term Goal 1 (Week 1): Pt will perform wc to/from bed w/mod assist to R, max assist of 1 to L PT Short Term Goal 2 (Week 1): sit to stand w/LRAD and mod assist PT Short Term Goal 3 (Week 1): gait x 79ft w/LRAD and mod assist of 2, appropriate bracing/wrapping of R ankle PT Short Term Goal 4 (Week 1): pt will maintain sitting balance w/cross body reaching tasks w/min assist  Skilled Therapeutic Interventions/Progress Updates: Pt presented in bed agreeable to therapy. Pt able to express some pain nodded yes when PTA pointed to abdomen (per nsg pt hasn't had bowel movement for 1 wk). PTA threaded pants and donned TED hose total A. Pt then performed supine to sit with modA with pt reaching for footboard for stabilization as PTA donned shoes. Pt then performed squat pivot transfer to R with modA. Pt transported to rehab gym total A for energy conservation and time management and performed squat pivot to R maxA with PTA blocking R knee. Pt able to scoot to R with minA and mod verbal cues for sequencing and to increase forward flexion. Participated in sitting balance reaching activities reaching for horseshoes with LUE with moderate challenges. Pt was overall CGA with minimal challenges and with second person blocking RUE on mat for feedback when reaching to R pt was able to perform more moderate challenges with minA. Pt required intermittent verbal cues for more controlled movements as pt would attempt to reach with increased velocity with moderate challenges and cause LOB. Pt also participated in x 2 bouts of horseshoes in same manner with pt maintaining consistently CGA with varying challenges. Pt then participated in standing balance with PTA blocking R  knee and use of RW. Pt performed ~6STS through remaining session varying between min and modA and mod multimodal cues for hand placement. In standing pt participated in pre-gait wt shifting with PTA noting quad activation with wt shifting. Pt was also able to advance RLE and return to stating point. Pt noted to have narrow BOS and was unable to correct with mod/moa multimodal cues. Pt performed stand pivot transfer back to w/c with RW and maxA. Pt transported back to room and remained in w/c at end of session as it was lunchtime. Pt left in w/c with belt alarm on, call bell within reach and half lap tray in place.      Therapy Documentation Precautions:  Precautions Precautions: Fall Precaution Comments: aphasia Restrictions Weight Bearing Restrictions: No General:   Vital Signs: Therapy Vitals Temp: 97.8 F (36.6 C) Pulse Rate: 77 Resp: 16 BP: (!) 121/93 Patient Position (if appropriate): Sitting Oxygen Therapy SpO2: 100 % O2 Device: Room Air   Therapy/Group: Individual Therapy  Buddy Loeffelholz  Alis Sawchuk, PTA  05/22/2020, 4:12 PM

## 2020-05-22 NOTE — Plan of Care (Signed)
  Problem: RH BOWEL ELIMINATION Goal: RH STG MANAGE BOWEL WITH ASSISTANCE Description: STG Manage Bowel with Assistance. min Outcome: Progressing   Problem: RH BLADDER ELIMINATION Goal: RH STG MANAGE BLADDER WITH ASSISTANCE Description: STG Manage Bladder With Assistance. Min assist Outcome: Progressing   Problem: RH SKIN INTEGRITY Goal: RH STG SKIN FREE OF INFECTION/BREAKDOWN Description: Skin to remain intact, prevent excoriation with min assist Outcome: Progressing

## 2020-05-22 NOTE — Progress Notes (Signed)
Speech Language Pathology Daily Session Note  Patient Details  Name: Aaron Weiss MRN: [id number] Date of Birth: 1966-02-10  Today's Date: 05/22/2020 SLP Individual Time: 5681-2751 SLP Individual Time Calculation (min): 56 min  Short Term Goals: Week 1: SLP Short Term Goal 1 (Week 1): Pt will consume trials of honey thick liquids with minimal overt s/sx aspiration X2 prior to advancement or repeat testing. SLP Short Term Goal 2 (Week 1): Pt will consume trials of Dys 2 textures across at least 2 sessions with efficient mastication and oral clearance with no more than Min A cues for use of compensatory swallow strategies prior to advancement. SLP Short Term Goal 3 (Week 1): Pt will name functional common objects with 50% accuracy with Max A multimodal cues. SLP Short Term Goal 4 (Week 1): Pt will demonstrate ability to communiate basic needs (ex: bathroom, hungry, etc.) via gestures and/or other multimodal means with Mod A cues. SLP Short Term Goal 5 (Week 1): Pt will match words to objects with 50% accuracy with Max A multimodal cues. SLP Short Term Goal 6 (Week 1): Pt will demonstrate awareness of verbal errors in 7/10 opportunities with Mod A multimodal cues.  Skilled Therapeutic Interventions: Pt was seen for skilled ST targeting dysphagia and communication goals. Upon arrival, SLP encouraged pt to try all pureed breakfast items, which he would consume very small bites of, with exception of applesauce, which he ate the whole container of. Oral transit and clearance of pureed textures was efficient and appeared timely. Min-Mod right sided anterior loss of all textures noted, with Mod A verbal cues required to correct. He consumed total of 8 oz honey thick juice and tea with 1 instance of wet vocal quality. He was responsive to a verbal cue to clear his throat, which also cleared the sound of his vocal quality. Given very limited puree PO intake, SLP provided a Dys 2 texture snack to work  toward advancement. Moderate right buccal pocketing noted, requiring Mod A verbal and visual cues from SLP to use liquid washes, lingual sweeps, and extra dry swallows to clear. Recommend continue current diet for now, ST will continue to provide skilled interventions to work toward advancement. Pt provided yes/no answers to communicate his preferences throughout session with ease. Structured expressive language interventions focused on production of CV and CVC words with initial consonants /b/, /m/, /p/, /b/, /d/, and /f/. Although pt's initial productions were <20% accurate, he demonstrated ability to correct by repeating SLP with 90% accuracy. Moderate cues for increased vocal intensity and overarticulation also required to increase speech intelligibility (~75-80%) at the word level. Mild perseveration on "apple" noted today, but less frequent than last session with this SLP. Pt left sitting upright in bed with alarm set and needs within reach. Continue per current plan of care.          Pain Pain Assessment Pain Scale: Faces Faces Pain Scale: No hurt  Therapy/Group: Individual Therapy  Aaron Weiss 05/22/2020, 7:19 AM

## 2020-05-22 NOTE — Progress Notes (Signed)
[facility] PHYSICAL MEDICINE & REHABILITATION PROGRESS NOTE   Subjective/Complaints:  No issues overnite , working with SLP on swallowing, upgraded from pudding liquids to Honey liquid Foley bag with clear yellow urine   ROS- aphasia   Objective:   No results found. No results for input(s): WBC, HGB, HCT, PLT in the last 72 hours. Recent Labs    05/22/20 0539  CREATININE 0.90    Intake/Output Summary (Last 24 hours) at 05/22/2020 9381 Last data filed at 05/22/2020 0451 Gross per 24 hour  Intake 240 ml  Output 1550 ml  Net -1310 ml        Physical Exam: Vital Signs Blood pressure 134/87, pulse 62, temperature 98 F (36.7 C), temperature source Oral, resp. rate 18, height 5\' 6"  (1.676 m), weight 81.9 kg, SpO2 98 %.   General: No acute distress Mood and affect are appropriate Heart: Regular rate and rhythm no rubs murmurs or extra sounds Lungs: Clear to auscultation, breathing unlabored, no rales or wheezes Abdomen: Positive bowel sounds, soft nontender to palpation, nondistended Extremities: No clubbing, cyanosis, or edema Skin: No evidence of breakdown, no evidence of rash  Neurologic: Cranial nerves II through XII intact, motor strength is 5/5 in left and 0/5 right  deltoid, bicep, tricep, grip, hip flexor, knee extensors, ankle dorsiflexor and plantar flexor Sensory exam cannot assess due to aphasia  Cerebellar exam cannot perform on Right due to weakness Musculoskeletal: Full range of motion in all 4 extremities. No joint swelling   Assessment/Plan: 1. Functional deficits which require 3+ hours per day of interdisciplinary therapy in a comprehensive inpatient rehab setting.  Physiatrist is providing close team supervision and 24 hour management of active medical problems listed below.  Physiatrist and rehab team continue to assess barriers to discharge/monitor patient progress toward functional and medical goals  Care Tool:  Bathing    Body parts bathed  by patient: Chest,Abdomen,Front perineal area,Right upper leg,Left upper leg,Face   Body parts bathed by helper: Right arm,Left arm,Chest,Front perineal area,Buttocks,Right upper leg,Left upper leg,Right lower leg,Left lower leg     Bathing assist Assist Level: Maximal Assistance - Patient 24 - 49%     Upper Body Dressing/Undressing Upper body dressing   What is the patient wearing?: Pull over shirt    Upper body assist Assist Level: Maximal Assistance - Patient 25 - 49%    Lower Body Dressing/Undressing Lower body dressing      What is the patient wearing?: Pants,Incontinence brief     Lower body assist Assist for lower body dressing: Total Assistance - Patient < 25%     Toileting Toileting    Toileting assist Assist for toileting: Dependent - Patient 0%     Transfers Chair/bed transfer  Transfers assist     Chair/bed transfer assist level: Maximal Assistance - Patient 25 - 49%     Locomotion Ambulation   Ambulation assist      Assist level: 2 helpers Assistive device: Hand held assist Max distance: 10   Walk 10 feet activity   Assist     Assist level: 2 helpers Assistive device: Hand held assist   Walk 50 feet activity   Assist Walk 50 feet with 2 turns activity did not occur: Safety/medical concerns  Assist level:  (R ankle instability limits assessment)      Walk 150 feet activity   Assist Walk 150 feet activity did not occur: Safety/medical concerns         Walk 10 feet on uneven surface  activity   Assist Walk 10 feet on uneven surfaces activity did not occur: Safety/medical concerns         Wheelchair     Assist Will patient use wheelchair at discharge?: Yes Type of Wheelchair: Manual    Wheelchair assist level: Maximal Assistance - Patient 25 - 49% Max wheelchair distance: 1ft    Wheelchair 50 feet with 2 turns activity    Assist        Assist Level: Total Assistance - Patient < 25%   Wheelchair  150 feet activity     Assist      Assist Level: Total Assistance - Patient < 25%   Blood pressure 134/87, pulse 62, temperature 98 F (36.7 C), temperature source Oral, resp. rate 18, height 5\' 6"  (1.676 m), weight 81.9 kg, SpO2 98 %.    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 -patient may  shower -ELOS/Goals: sup/minA, 18-21d 2. Antithrombotics: -DVT/anticoagulation:Lovenox -antiplatelet therapy: Aspirin 325 mg daily and Plavix 75 mg daily x3 months then aspirin alone 3. Pain Management:All in all as needed 4. Mood:Provide emotional support -antipsychotic agents: N/A 5. Neuropsych: This patientis notcapable of making decisions on hisown behalf. 6. Skin/Wound Care:Routine skin checks 7. Fluids/Electrolytes/Nutrition:Routine in and outs with follow-up chemistries 8. Hypertension. Lisinopril 20 mg daily. Monitor with increased mobility Vitals:   05/21/20 1954 05/22/20 0425  BP: 119/73 134/87  Pulse: 63 62  Resp: 18 18  Temp: 98.2 F (36.8 C) 98 F (36.7 C)  SpO2: 98% 98%  controlled 12/13  9. Dysphagia. Dysphagia #1 pudding thick liquid. Monitor hydration. Speech therapy follow-up 10. New findings diabetes mellitus. Hemoglobin A1c 8.7. Lantus insulin 10 units nightly. Diabetic teaching CBG (last 3)  Recent Labs    05/21/20 1649 05/21/20 2037 05/22/20 0626  GLUCAP 125* 151* 110*  increase lantus to 15U  11. Hyperlipidemia. Lipitor 12. Ascending aortic aneurysm with dilation 4.1 cm. Recommendations annual imaging followed by CTA or MRA. 13.  Pre renal azotemia, IVF at noc repeat BMET later in week  14.  Leukocytosis , afeb check UA C and S - 20K enterococcus now on macrobid 15.  Urinary retention appreciate uro assist LOS: 7 days A FACE TO FACE EVALUATION WAS  PERFORMED  05/24/20 05/22/2020, 8:22 AM

## 2020-05-23 ENCOUNTER — Inpatient Hospital Stay (HOSPITAL_COMMUNITY): Payer: BC Managed Care – PPO | Admitting: Speech Pathology

## 2020-05-23 ENCOUNTER — Inpatient Hospital Stay (HOSPITAL_COMMUNITY): Payer: BC Managed Care – PPO | Admitting: Physical Therapy

## 2020-05-23 ENCOUNTER — Inpatient Hospital Stay (HOSPITAL_COMMUNITY): Payer: BC Managed Care – PPO | Admitting: Occupational Therapy

## 2020-05-23 LAB — GLUCOSE, CAPILLARY
Glucose-Capillary: 117 mg/dL — ABNORMAL HIGH (ref 70–99)
Glucose-Capillary: 185 mg/dL — ABNORMAL HIGH (ref 70–99)
Glucose-Capillary: 123 mg/dL — ABNORMAL HIGH (ref 70–99)
Glucose-Capillary: 117 mg/dL — ABNORMAL HIGH (ref 70–99)

## 2020-05-23 NOTE — Plan of Care (Signed)
  Problem: RH BOWEL ELIMINATION Goal: RH STG MANAGE BOWEL WITH ASSISTANCE Description: STG Manage Bowel with Assistance. min Outcome: Progressing   Problem: RH KNOWLEDGE DEFICIT Goal: RH STG INCREASE KNOWLEDGE OF DYSPHAGIA/FLUID INTAKE Description: Patient/caregiver will be able to verbalize safe swallowing techniques and types of food/fluid appropriate for patient with cues/handouts Outcome: Progressing Goal: RH STG INCREASE KNOWLEDGE OF STROKE PROPHYLAXIS Description: Patient/family will be able to verbalize prevention strategies against stroke with cues/handouts including medications, diet, and lifestyle changes. Outcome: Progressing

## 2020-05-23 NOTE — Progress Notes (Signed)
[facility] PHYSICAL MEDICINE & REHABILITATION PROGRESS NOTE   Subjective/Complaints: No complaints this morning. Denies pain, constipation, insomnia.   ROS- aphasia   Objective:   No results found. No results for input(s): WBC, HGB, HCT, PLT in the last 72 hours. Recent Labs    05/22/20 0539  CREATININE 0.90    Intake/Output Summary (Last 24 hours) at 05/23/2020 0920 Last data filed at 05/23/2020 0700 Gross per 24 hour  Intake 240 ml  Output 2100 ml  Net -1860 ml        Physical Exam: Vital Signs Blood pressure 129/73, pulse (!) 58, temperature 98 F (36.7 C), temperature source Oral, resp. rate 16, height 5\' 6"  (1.676 m), weight 81.9 kg, SpO2 97 %. Gen: no distress, normal appearing HEENT: oral mucosa pink and moist, NCAT Cardio: Bradycardic Chest: normal effort, normal rate of breathing Abd: soft, non-distended Ext: no edema Skin: intact  Neurologic: Cranial nerves II through XII intact, motor strength is 5/5 in left and 0/5 right  deltoid, bicep, tricep, grip, hip flexor, knee extensors, ankle dorsiflexor and plantar flexor Sensory exam cannot assess due to aphasia  Cerebellar exam cannot perform on Right due to weakness Musculoskeletal: Full range of motion in all 4 extremities. No joint swelling   Assessment/Plan: 1. Functional deficits which require 3+ hours per day of interdisciplinary therapy in a comprehensive inpatient rehab setting.  Physiatrist is providing close team supervision and 24 hour management of active medical problems listed below.  Physiatrist and rehab team continue to assess barriers to discharge/monitor patient progress toward functional and medical goals  Care Tool:  Bathing    Body parts bathed by patient: Chest,Abdomen,Front perineal area,Right upper leg,Left upper leg,Face   Body parts bathed by helper: Right arm,Left arm,Chest,Front perineal area,Buttocks,Right upper leg,Left upper leg,Right lower leg,Left lower leg      Bathing assist Assist Level: Maximal Assistance - Patient 24 - 49%     Upper Body Dressing/Undressing Upper body dressing   What is the patient wearing?: Pull over shirt    Upper body assist Assist Level: Maximal Assistance - Patient 25 - 49%    Lower Body Dressing/Undressing Lower body dressing      What is the patient wearing?: Pants,Incontinence brief     Lower body assist Assist for lower body dressing: Total Assistance - Patient < 25%     Toileting Toileting    Toileting assist Assist for toileting: Dependent - Patient 0%     Transfers Chair/bed transfer  Transfers assist     Chair/bed transfer assist level: Moderate Assistance - Patient 50 - 74%     Locomotion Ambulation   Ambulation assist      Assist level: 2 helpers Assistive device: Hand held assist Max distance: 10   Walk 10 feet activity   Assist     Assist level: 2 helpers Assistive device: Hand held assist   Walk 50 feet activity   Assist Walk 50 feet with 2 turns activity did not occur: Safety/medical concerns  Assist level:  (R ankle instability limits assessment)      Walk 150 feet activity   Assist Walk 150 feet activity did not occur: Safety/medical concerns         Walk 10 feet on uneven surface  activity   Assist Walk 10 feet on uneven surfaces activity did not occur: Safety/medical concerns         Wheelchair     Assist Will patient use wheelchair at discharge?: Yes Type of Wheelchair: Manual  Wheelchair assist level: Maximal Assistance - Patient 25 - 49% Max wheelchair distance: 24ft    Wheelchair 50 feet with 2 turns activity    Assist        Assist Level: Total Assistance - Patient < 25%   Wheelchair 150 feet activity     Assist      Assist Level: Total Assistance - Patient < 25%   Blood pressure 129/73, pulse (!) 58, temperature 98 F (36.7 C), temperature source Oral, resp. rate 16, height 5\' 6"  (1.676 m), weight 81.9  kg, SpO2 97 %.    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 -patient may  shower -ELOS/Goals: sup/minA, 18-21d  -Continue CIR 2. Antithrombotics: -DVT/anticoagulation:Lovenox -antiplatelet therapy: Aspirin 325 mg daily and Plavix 75 mg daily x3 months then aspirin alone 3. Pain Management:All in all as needed 4. Mood:Provide emotional support -antipsychotic agents: N/A 5. Neuropsych: This patientis notcapable of making decisions on hisown behalf. 6. Skin/Wound Care:Routine skin checks 7. Fluids/Electrolytes/Nutrition:Routine in and outs with follow-up chemistries 8. Hypertension. Lisinopril 20 mg daily. Monitor with increased mobility Vitals:   05/22/20 1937 05/23/20 0511  BP: 107/73 129/73  Pulse: (!) 55 (!) 58  Resp: 18 16  Temp: 98.2 F (36.8 C) 98 F (36.7 C)  SpO2: 96% 97%  controlled 12/14- continue to monitor.   9. Dysphagia. Dysphagia #1 pudding thick liquid. Monitor hydration. Speech therapy follow-up 10. New findings diabetes mellitus. Hemoglobin A1c 8.7. Lantus insulin 10 units nightly. Diabetic teaching CBG (last 3)  Recent Labs    05/22/20 1628 05/22/20 2100 05/23/20 0610  GLUCAP 150* 103* 117*  increase lantus to 15U  12/14: CBG ranging from 103- 150: continue to monitor.  11. Hyperlipidemia. Lipitor 12. Ascending aortic aneurysm with dilation 4.1 cm. Recommendations annual imaging followed by CTA or MRA. 13.  Pre renal azotemia, IVF at noc repeat BMET later in week  14.  Leukocytosis , afeb check UA C and S - 20K enterococcus now on macrobid 15.  Urinary retention appreciate uro assist 16. Bradycardic to 50s: flowsheet reviewed, this is occasional, continue to monitor.  LOS: 8 days A FACE TO FACE EVALUATION WAS PERFORMED  Nickholas Goldston P  Erla Bacchi 05/23/2020, 9:20 AM

## 2020-05-23 NOTE — Progress Notes (Signed)
Occupational Therapy Session Note  Patient Details  Name: Aaron Weiss MRN: [id number] Date of Birth: [birth year]  Today's Date: 05/23/2020 OT Individual Time: 1443-1540 OT Individual Time Calculation (min): 60 min    Short Term Goals: Week 1:  OT Short Term Goal 1 (Week 1): patient will roll in bed with min A, move to sitting and lying positions with min a OT Short Term Goal 2 (Week 1): patient will complete upper body bathing and dressing with min A OT Short Term Goal 3 (Week 1): patient will use right UE as gross stabilizer with mod cues and set up OT Short Term Goal 4 (Week 1): patient will sit unsupported with CS and complete sit pivot transfers with mod A  Skilled Therapeutic Interventions/Progress Updates:    Pt received in bed and agreeable to therapy.  His RN disconnected him from his IV.  Pt rolled onto R side with min and then pushed up with his L arm to sit with min A.  Used a squat pivot to BSC with mod A. See ADL documentation below for details.  Pt participated well and demonstrated good postural control with self care.  He actually lifted his R shoulder slightly (15 degrees) in abd to wash under his arm.   Sit to stands from [facility] with L hand on rail. Min A to rise and then pt held balance with min for therapist to pull pants over hips.  Squat pivot to L to wc.  From wc pt did not want to cleanse his gums or place in his dentures.  Soaked his dentures for pt in cleaner.  Attempted to place saebo stim on pt's forearm but he has too much hair for adequate pad contact. Will need to try other unit.  Placed Saebo on his deltoid for unattended cyclic estim.   Pt tolerated stimulation well for 60 min, no adverse skin reaction.  Saebo Stim One 330 pulse width 35 Hz pulse rate On 8 sec/ off 8 sec Ramp up/ down 2 sec Symmetrical Biphasic wave form  Max intensity 160m at 500 Ohm load  (estim placed on pt and then removed during his SLP session).  Pt resting in wc with belt  alarm on and all needs met.   Therapy Documentation Precautions:  Precautions Precautions: Fall Precaution Comments: aphasia Restrictions Weight Bearing Restrictions: No   Pain: Pain Assessment Pain Scale: 0-10 Pain Score: 0-No pain ADL: ADL Eating: Moderate assistance Where Assessed-Eating: Wheelchair Grooming: Minimal assistance Where Assessed-Grooming: Wheelchair Upper Body Bathing: Minimal assistance Where Assessed-Upper Body Bathing: Wheelchair Lower Body Bathing: Moderate assistance Where Assessed-Lower Body Bathing: Chair Upper Body Dressing: Moderate assistance Where Assessed-Upper Body Dressing: Chair Lower Body Dressing: Maximal assistance Where Assessed-Lower Body Dressing: Chair Toileting: Dependent Where Assessed-Toileting: Bedside Commode Toilet Transfer: Moderate assistance Toilet Transfer Method: Squat pivot Toilet Transfer Equipment: Drop arm bedside commode ADL Comments: sit pivot transfer bed to/from w/c max A  Therapy/Group: Individual Therapy  SCaldwell12/14/2021, 12:20 PM

## 2020-05-23 NOTE — Progress Notes (Signed)
Speech Language Pathology Weekly Progress and Session Note  Patient Details  Name: Aaron Weiss MRN: [id number] Date of Birth: 1965/08/15  Beginning of progress report period: May 16, 2020 End of progress report period: May 23, 2020  Today's Date: 05/23/2020 SLP Individual Time: 1000-1056 SLP Individual Time Calculation (min): 56 min  Short Term Goals: Week 1: SLP Short Term Goal 1 (Week 1): Pt will consume trials of honey thick liquids with minimal overt s/sx aspiration X2 prior to advancement or repeat testing. SLP Short Term Goal 1 - Progress (Week 1): Met SLP Short Term Goal 2 (Week 1): Pt will consume trials of Dys 2 textures across at least 2 sessions with efficient mastication and oral clearance with no more than Min A cues for use of compensatory swallow strategies prior to advancement. SLP Short Term Goal 2 - Progress (Week 1): Progressing toward goal SLP Short Term Goal 3 (Week 1): Pt will name functional common objects with 50% accuracy with Max A multimodal cues. SLP Short Term Goal 3 - Progress (Week 1): Met SLP Short Term Goal 4 (Week 1): Pt will demonstrate ability to communiate basic needs (ex: bathroom, hungry, etc.) via gestures and/or other multimodal means with Mod A cues. SLP Short Term Goal 4 - Progress (Week 1): Progressing toward goal SLP Short Term Goal 5 (Week 1): Pt will match words to objects with 50% accuracy with Max A multimodal cues. SLP Short Term Goal 5 - Progress (Week 1): Met SLP Short Term Goal 6 (Week 1): Pt will demonstrate awareness of verbal errors in 7/10 opportunities with Mod A multimodal cues. SLP Short Term Goal 6 - Progress (Week 1): Progressing toward goal    New Short Term Goals: Week 2: SLP Short Term Goal 1 (Week 2): Pt will consume trials of Dys 2 textures across at least 2 sessions with efficient mastication and oral clearance with no more than Min A cues for use of compensatory swallow strategies prior to  advancement. SLP Short Term Goal 2 (Week 2): Pt will consume therapeutic trials of ice/thin H2O after oral care with minimal overt s/sx aspiration X3 prior to repeat instrumental testing of swallow function. SLP Short Term Goal 3 (Week 2): Pt will match words to objects with 80% accuracy with Mod A multimodal cues. SLP Short Term Goal 4 (Week 2): Pt will name common functional objects with 75% accuracy provided Mod A cues. SLP Short Term Goal 5 (Week 2): Pt will use strategies to increase his speech intelligibility with Min A cues. SLP Short Term Goal 6 (Week 2): Pt will demonstrate awareness of verbal errors in 75% of opportunities provided Mod A cues/feedback.  Weekly Progress Updates: Pt has made functional gains and met 3 out of 6 short term goals. Pt is currently Max assist for functional communication due to severe mixed expressive/receptive aphasia. He has demonstrated some improvement in his receptive abilities this week, however still requires Min-Mod A for comprehension of auditory information and cues for following more complex directions. He also presents with moderate dysarthria, requiring Mod A cues to use strategies to increase his speech intelligibility. He exhibits mild perseveration on words, although improving. Pt participated in a repeat MBSS this reporting period and was upgraded to honey thick liquids (from pudding). He is still consuming a dysphagia 1 (puree) texture diet but is participating in dysphagia 2 (minced/ground) trials with ST. Pt education is ongoing; no family has been present for ST sessions yet, and they would benefit from education prior to  d/c home. Pt would continue to benefit from skilled ST while inpatient in order to maximize functional independence and reduce burden of care prior to discharge. Anticipate that pt will need 24/7 supervision at discharge in addition to [place] follow up at next level of care.     Intensity: Minumum of 1-2 x/day, 30 to 90  minutes Frequency: 3 to 5 out of 7 days Duration/Length of Stay: 06/09/20 Treatment/Interventions: Cueing hierarchy;Functional tasks;Cognitive remediation/compensation;Speech/Language facilitation;Internal/external aids;Multimodal communication approach;Patient/family education;Therapeutic Activities;Dysphagia/aspiration precaution training   Daily Session  Skilled Therapeutic Interventions: Pt was seen for skilled ST targeting dysphagia and communication goals. SLP facilitated session with honey thick liquid and advanced Dys 2 (minced/ground) texture snack. Pt exhibited wet vocal quality X1 during intake. He required Mod faded to [place] (~1/2 way through) verbal cues to use lingual sweeps and liquid washes to clear right buccal pocketing of Dys 2 solids and correct right anterior loss of solids. Recommend continue current diet, however pt is making good progress toward solid advancement, and demonstrating improvement in ability to utilize compensatory strategies for more efficient mastication and oral clearance. SLP further facilitated session with structured expressive and receptive language tasks. He used basic 6 picture/word communication board with 50%, accuracy but ability to correct incorrect answers with Moderate semantic and visual cues. Pt matched common objects to words (from field of 2) with word being read aloud by SLP with 80% accuracy. He answered questions with common object names with 70% accuracy when provided Mod-Max A phonemic cues. Pt is able to repeat at word and short phrase (2-3) words easily, but difficulty producing accurate words/phrases without a model first. He is most responsive to phonemic cueing, but semantic sentence completion cues are intermittently successful. Pt perseverative on "apple" and "pen" this morning, but demonstrated increased awareness of verbal errors in ~60-70% opportunities with Mod A feedback from SLP. Pt left sitting in chair with alarm set and needs within  reach. Continue per current plan of care.       Pain Pain Assessment Pain Scale: 0-10 Pain Score: 0-No pain  Therapy/Group: Individual Therapy  Arbutus Leas 05/23/2020, 7:29 AM

## 2020-05-23 NOTE — Progress Notes (Signed)
Physical Therapy Session Note  Patient Details  Name: Aaron Weiss MRN: [id number] Date of Birth: [birth year]  Today's Date: 05/23/2020 PT Individual Time: 2194-7125 PT Individual Time Calculation (min): 72 min   Short Term Goals: Week 1:  PT Short Term Goal 1 (Week 1): Pt will perform wc to/from bed w/mod assist to R, max assist of 1 to L PT Short Term Goal 2 (Week 1): sit to stand w/LRAD and mod assist PT Short Term Goal 3 (Week 1): gait x 59f w/LRAD and mod assist of 2, appropriate bracing/wrapping of R ankle PT Short Term Goal 4 (Week 1): pt will maintain sitting balance w/cross body reaching tasks w/min assist  Skilled Therapeutic Interventions/Progress Updates: Pt presented in bed agreeable to therapy. Pt denies pain during session. Performed supine to sit at EOB with use of bed features and minA. Pt noted to have more controlled movement this session vs powering through to sit upright. Pt's son DShanon Browarrived during transfer to pick up clothes. Discussed current progress with son as he will not be around this weekend. Pt then transported to rehab gym and performed STS in parallel bars with forward/backwards stepping and wt shifting for pre-gait activitity. Pt required manual facilitation at hips to improve wt shifting and PTA placed balance board between pt's legs to increase BOS. Pt then participated in gait training x 139fwith RW and modA. Pt required multimodal cues for increasing step width with PTA adjusting foot placement approx 50% of time. Pt maintained RLE in slightly flexed position however no significant knee buckling. After seated rest pt ambulated additional x 5 ft with PTA placing shoe cover on R foot in effort to improve swing through however pt with decreased coordination with this bout of ambulation and required max multimodal cues to maintain erect posture (more lateral lean) additional ambulation therefore deferred. Participated in w/c mobility x 10048fith minA and  verbal cues to maintain more straight trajectory due to frequent R drift. [facility] for coordination of hemi technique but then was able to maintain with verbal cues.  Pt transported remaining distance to day room and participated in Cybex Kinetron 70cmsec x1 min then x2 min for general conditioning with PTA providing multimodal cues to push through full range to increase extension. Pt transported back to room and performed squat pivot to R with modA to return to bed and minA to scoot backwards for improved positioning. Required modA for sit to supine but pt demonstrated improved safety and ability to follow cues. Pt repositioned to comfort and left with pillow placed under RUE, bed alarm on, call bell within reach and needs met.      Therapy Documentation Precautions:  Precautions Precautions: Fall Precaution Comments: aphasia Restrictions Weight Bearing Restrictions: No General:   Vital Signs:  Pain: Pain Assessment Pain Scale: 0-10 Pain Score: 0-No pain Mobility:   Locomotion :    Trunk/Postural Assessment :    Balance:   Exercises:   Other Treatments:      Therapy/Group: Individual Therapy  Junette Bernat 05/23/2020, 3:53 PM

## 2020-05-24 ENCOUNTER — Inpatient Hospital Stay (HOSPITAL_COMMUNITY): Payer: BC Managed Care – PPO | Admitting: Physical Therapy

## 2020-05-24 ENCOUNTER — Inpatient Hospital Stay (HOSPITAL_COMMUNITY): Payer: BC Managed Care – PPO | Admitting: Speech Pathology

## 2020-05-24 ENCOUNTER — Inpatient Hospital Stay (HOSPITAL_COMMUNITY): Payer: BC Managed Care – PPO

## 2020-05-24 LAB — GLUCOSE, CAPILLARY
Glucose-Capillary: 97 mg/dL (ref 70–99)
Glucose-Capillary: 157 mg/dL — ABNORMAL HIGH (ref 70–99)
Glucose-Capillary: 127 mg/dL — ABNORMAL HIGH (ref 70–99)
Glucose-Capillary: 124 mg/dL — ABNORMAL HIGH (ref 70–99)

## 2020-05-24 MED ORDER — TAMSULOSIN HCL 0.4 MG PO CAPS
0.4000 mg | ORAL_CAPSULE | Freq: Every day | ORAL | Status: DC
  Administered 2020-05-24 – 2020-06-08 (×16): 0.4 mg via ORAL
  Filled 2020-05-24 (×17): qty 1

## 2020-05-24 NOTE — Progress Notes (Signed)
pt refused the R hand splint.

## 2020-05-24 NOTE — Progress Notes (Signed)
Speech Language Pathology Daily Session Note  Patient Details  Name: Aaron Weiss MRN: [id number] Date of Birth: [birth year]  Today's Date: 05/25/2020 SLP Individual Time: 7106-2694 SLP Individual Time Calculation (min): 58 min  Short Term Goals: Week 2: SLP Short Term Goal 1 (Week 2): Pt will consume trials of Dys 2 textures across at least 2 sessions with efficient mastication and oral clearance with no more than Min A cues for use of compensatory swallow strategies prior to advancement. SLP Short Term Goal 2 (Week 2): Pt will consume therapeutic trials of ice/thin H2O after oral care with minimal overt s/sx aspiration X3 prior to repeat instrumental testing of swallow function. SLP Short Term Goal 3 (Week 2): Pt will match words to objects with 80% accuracy with Mod A multimodal cues. SLP Short Term Goal 4 (Week 2): Pt will name common functional objects with 75% accuracy provided Mod A cues. SLP Short Term Goal 5 (Week 2): Pt will use strategies to increase his speech intelligibility with Min A cues. SLP Short Term Goal 6 (Week 2): Pt will demonstrate awareness of verbal errors in 75% of opportunities provided Mod A cues/feedback.  Skilled Therapeutic Interventions: Pt was seen for skilled ST targeting dysphagia and cognitive goals. SLP facilitated session with trial tray of dys 2 textures with honey thick liquids. He demonstrated Min left pocketing of solids that could be cleared with Min A cueing for liquid washes and lingual sweeps. No overt s/sx aspiration noted during intake. Recommend pt upgrade to Dys 2 textures (minced/ground), continue honey thick liquids, meds may be attempted whole in applesauce 1 at a time. During structured speech tasks, pt required Moderate phonemic and semantic cues for accurate completion of phrases. Min-Mod cueing also required for awareness and correction of verbal errors (semantic paraphasias and perseveration on words). Pt named CV and CVC words with  initial consonant /p/ with 4/7 accuracy with only picture supports, Min-Mod A cueing for production of remaining 3. Pt left laying in bed with alarm set and needs within reach. Continue per current plan of care.          Pain Pain Assessment Pain Scale: 0-10 Pain Score: 0-No pain  Therapy/Group: Individual Therapy  [name] 05/25/2020, 12:15 PM

## 2020-05-24 NOTE — Progress Notes (Signed)
Patient ID: Aaron Weiss, male   DOB: 16-Feb-1966, 54 y.o.   MRN: [id number] Team Conference Report to Patient/Family  Team Conference discussion was reviewed with the patient and caregiver, including goals, any changes in plan of care and target discharge date.  Patient and caregiver express understanding and are in agreement.  The patient has a target discharge date of 06/09/20.  [name] 05/24/2020, 2:07 PM

## 2020-05-24 NOTE — Progress Notes (Signed)
Speech Language Pathology Daily Session Note  Patient Details  Name: Aaron Weiss MRN: [id number] Date of Birth: 10-25-1965  Today's Date: 05/24/2020 SLP Individual Time: 6759-1638 SLP Individual Time Calculation (min): 41 min  Short Term Goals: Week 2: SLP Short Term Goal 1 (Week 2): Pt will consume trials of Dys 2 textures across at least 2 sessions with efficient mastication and oral clearance with no more than Min A cues for use of compensatory swallow strategies prior to advancement. SLP Short Term Goal 2 (Week 2): Pt will consume therapeutic trials of ice/thin H2O after oral care with minimal overt s/sx aspiration X3 prior to repeat instrumental testing of swallow function. SLP Short Term Goal 3 (Week 2): Pt will match words to objects with 80% accuracy with Mod A multimodal cues. SLP Short Term Goal 4 (Week 2): Pt will name common functional objects with 75% accuracy provided Mod A cues. SLP Short Term Goal 5 (Week 2): Pt will use strategies to increase his speech intelligibility with Min A cues. SLP Short Term Goal 6 (Week 2): Pt will demonstrate awareness of verbal errors in 75% of opportunities provided Mod A cues/feedback.  Skilled Therapeutic Interventions: Pt was seen for skilled ST targeting dysphagia and communication goals. SLP facilitated session with an advanced trial of Dys 2 texture snack with honey thick apple juice. No overt s/sx aspiration noted during intake. Overall Min A verbal cues provided for use of strategies to clear right buccal pocketing of Dys 2 textures (lingual sweeps and liquid washes with honey thick juice). Pt demonstrating excellent improvements with carryover of swallow strategies. Recommend continue current diet but will attempt a trial tray (full meal) of Dys 2 textures tomorrow to assess readiness for advancement. SLP further facilitated session with tasks targeting automatic sequences for accuracy in verbal output, writing, and multimodal  communication. Pt with improved accuracy in use of communication board this morning; his responses (via pointing to pictures) were 4/6 accurate, but with Min A verbal cues and extra time he corrected himself on the 2 incorrect responses. He required mostly copy cues to write at the word level, including his name. Counting 1-20, months of the year, days of the week, and letters of the alphabet were 70-80% accuracy provided Min-Mod A phonemic and visual cues. Pt left laying in bed with alarm set and needs within reach. Continue per current plan of care.       Pain Pain Assessment Pain Scale: 0-10 Pain Score: 0-No pain  Therapy/Group: Individual Therapy  Aaron Weiss 05/24/2020, 7:27 AM

## 2020-05-24 NOTE — Progress Notes (Signed)
Orthopedic Tech Progress Note Patient Details:  Aaron Weiss 07/20/1965 [id number] RN stated PRAFO was ordered for foot drop. Applied PRAFO to patient.  Ortho Devices Type of Ortho Device: Prafo boot/shoe Ortho Device/Splint Location: RLE Ortho Device/Splint Interventions: Ordered,Application   Post Interventions Patient Tolerated: Well Instructions Provided: Care of device   Kerry Fort 05/24/2020, 5:11 PM

## 2020-05-24 NOTE — Progress Notes (Signed)
Physical Therapy Weekly Progress Note  Patient Details  Name: Aaron Weiss MRN: [id number] Date of Birth: [birth year]  Beginning of progress report period: May 16, 2020 End of progress report period: May 24, 2020  Today's Date: 05/24/2020 PT Individual Time: 1400-1500 PT Individual Time Calculation (min): 60 min   Patient has met 4 of 4 short term goals.  Pt is making steady progress during this plan of care. He is now able to ambulate with mod assist while using a RW with noted increases in RLE activation. He is also able to perform bed mobility with CGA and transfer to his R with min assist using a swing pivot method as well as perform a sit<>stand with CGA. He is able to maintain his seated balance with min assist whenever he is performing dynamic tasks. Overall, pt is progressing as expected.   Patient continues to demonstrate the following deficits muscle weakness, muscle joint tightness and muscle paralysis, decreased cardiorespiratoy endurance, impaired timing and sequencing, unbalanced muscle activation, motor apraxia, ataxia, decreased coordination and decreased motor planning, decreased attention to right and decreased motor planning, decreased initiation, decreased attention, decreased problem solving, decreased safety awareness and delayed processing and decreased sitting balance, decreased standing balance, decreased postural control, hemiplegia and decreased balance strategies and therefore will continue to benefit from skilled PT intervention to increase functional independence with mobility.  Patient progressing toward long term goals..  Continue plan of care.  PT Short Term Goals Week 1:  PT Short Term Goal 1 (Week 1): Pt will perform wc to/from bed w/mod assist to R, max assist of 1 to L PT Short Term Goal 1 - Progress (Week 1): Met PT Short Term Goal 2 (Week 1): sit to stand w/LRAD and mod assist PT Short Term Goal 2 - Progress (Week 1): Met PT Short Term Goal 3  (Week 1): gait x 54f w/LRAD and mod assist of 2, appropriate bracing/wrapping of R ankle PT Short Term Goal 3 - Progress (Week 1): Met PT Short Term Goal 4 (Week 1): pt will maintain sitting balance w/cross body reaching tasks w/min assist PT Short Term Goal 4 - Progress (Week 1): Met Week 2:  PT Short Term Goal 1 (Week 2): pt will ambulate 263fwith min assist and LRAD PT Short Term Goal 2 (Week 2): Pt will perform stand pivot transfers with mod assist PT Short Term Goal 3 (Week 2): Pt will perform swing pivot transfers with min assist or better in both directions PT Short Term Goal 4 (Week 2): Pt will demonstrate increased activation of his R glutes/quads and demonstrate carryover into gait training  Skilled Therapeutic Interventions/Progress Updates:    Pt received in wc and agreeable to PT. Dietary services present upon entering room. Handoff to PT. Pt transported to day room total assist for time management and energy conservation.   Gait/Pre-Gait: -pt ambulated at L railing with mod assist for RLE management, trunk posturing, and verbal cueing for sequencing. Pt with noted improvements in initiation, RLE advancement, sequencing, and posture.  -pt ambulated ~2287fith RW and mod assist for erect posturing, RLE blocking/facilitation to avoid adduction, verbal cueing, and overall balance. x2 -step overs with bil LE. Pt stepped over a yard stick placed in front of his feet 2x10 each leg in order to encourage RLE weightbearing when stepping with his L foot due to noted deficits in stance phase during gait. Stepping with R foot to encourage normalized step length and accepting of weight shift when stepping forward.  Pt returned to room total assist. Swing pivot to bed on R with min assist. Sit>supine with min assist for RLE management. Pt left supine in bed with call bell in reach, needs met, and bed alarm on. Pt does not complain of pain this session.  Therapy Documentation Precautions:   Precautions Precautions: Fall Precaution Comments: aphasia Restrictions Weight Bearing Restrictions: No   Therapy/Group: Individual Therapy  Gaylord Shih 05/24/2020, 3:16 PM

## 2020-05-24 NOTE — Progress Notes (Signed)
Physical Therapy Session Note  Patient Details  Name: Aaron Weiss MRN: [id number] Date of Birth: 1965/12/15  Today's Date: 05/24/2020 PT Individual Time: 1035-1130 PT Individual Time Calculation (min): 55 min   Short Term Goals: Week 1:  PT Short Term Goal 1 (Week 1): Pt will perform wc to/from bed w/mod assist to R, max assist of 1 to L PT Short Term Goal 2 (Week 1): sit to stand w/LRAD and mod assist PT Short Term Goal 3 (Week 1): gait x 62f w/LRAD and mod assist of 2, appropriate bracing/wrapping of R ankle PT Short Term Goal 4 (Week 1): pt will maintain sitting balance w/cross body reaching tasks w/min assist  Skilled Therapeutic Interventions/Progress Updates: Pt presented in bed sleeping but easily aroused and agreeable to therapy. Pt denies pain during session. Pt performed supine to sit with modA and use of bed features. PTA donned TED hose, shoes/AFO total A. Performed squat pivot transfer to L modA with verbal cues. Transported to rehab gym and performed squat pivot to mat to R modA. Participated in lateral leans to R then returning to midline with mirror feedback and PTA stabilizing RUE to allow wt bearing through RUE. Participated in lateral scoots L/R with focus on increasing anterior lean, attempting to push through BLE, moving RLE when needed, and RUE awareness. Pt inially required minA to scoot to R however was able to improve to CGA. Participated in block practice squat pivot transfers L/R. Pt was able to progress to minA squat pivot to L with only verbal cues required to management of RLE and modA squat pivot to R with same verbal cues needed. Pt transported back to room and agreeable to remain in w/c. Pt left in w/c with belt alarm on, call bell within reach and needs met.      Therapy Documentation Precautions:  Precautions Precautions: Fall Precaution Comments: aphasia Restrictions Weight Bearing Restrictions: No General:   Vital Signs:     Therapy/Group:  Individual Therapy  Aaron Weiss  Baron Parmelee, PTA  05/24/2020, 12:47 PM

## 2020-05-24 NOTE — Progress Notes (Signed)
[place] PHYSICAL MEDICINE & REHABILITATION PROGRESS NOTE   Subjective/Complaints:   Remains severely aphaic annot state name or recognize location from choices   ROS- aphasia   Objective:   No results found. No results for input(s): WBC, HGB, HCT, PLT in the last 72 hours. Recent Labs    05/22/20 0539  CREATININE 0.90    Intake/Output Summary (Last 24 hours) at 05/24/2020 0851 Last data filed at 05/24/2020 0527 Gross per 24 hour  Intake 240 ml  Output 1150 ml  Net -910 ml        Physical Exam: Vital Signs Blood pressure (!) 155/78, pulse (!) 56, temperature 97.8 F (36.6 C), temperature source Oral, resp. rate 16, height 5\' 6"  (1.676 m), weight 81.9 kg, SpO2 99 %.  General: No acute distress Mood and affect are appropriate Heart: Regular rate and rhythm no rubs murmurs or extra sounds Lungs: Clear to auscultation, breathing unlabored, no rales or wheezes Abdomen: Positive bowel sounds, soft nontender to palpation, nondistended Extremities: No clubbing, cyanosis, or edema Skin: No evidence of breakdown, no evidence of rash  Neurologic: Cranial nerves II through XII intact, motor strength is 5/5 in left and 0/5 right  deltoid, bicep, tricep, grip, hip flexor, knee extensors, ankle dorsiflexor and plantar flexor Sensory exam cannot assess due to aphasia  Cerebellar exam cannot perform on Right due to we Tone without spasticity akness Musculoskeletal: Full range of motion in all 4 extremities. No joint swelling   Assessment/Plan: 1. Functional deficits which require 3+ hours per day of interdisciplinary therapy in a comprehensive inpatient rehab setting.  Physiatrist is providing close team supervision and 24 hour management of active medical problems listed below.  Physiatrist and rehab team continue to assess barriers to discharge/monitor patient progress toward functional and medical goals  Care Tool:  Bathing    Body parts bathed by patient:  Chest,Abdomen,Front perineal area,Right upper leg,Left upper leg,Face,Left lower leg,Right arm   Body parts bathed by helper: Right arm,Left arm,Chest,Front perineal area,Buttocks,Right upper leg,Left upper leg,Right lower leg,Left lower leg Body parts n/a: Left arm,Right lower leg,Buttocks   Bathing assist Assist Level: Moderate Assistance - Patient 50 - 74%     Upper Body Dressing/Undressing Upper body dressing   What is the patient wearing?: Pull over shirt    Upper body assist Assist Level: Moderate Assistance - Patient 50 - 74%    Lower Body Dressing/Undressing Lower body dressing      What is the patient wearing?: Pants,Incontinence brief     Lower body assist Assist for lower body dressing: Maximal Assistance - Patient 25 - 49%     Toileting Toileting    Toileting assist Assist for toileting: Dependent - Patient 0%     Transfers Chair/bed transfer  Transfers assist     Chair/bed transfer assist level: Moderate Assistance - Patient 50 - 74%     Locomotion Ambulation   Ambulation assist      Assist level: Moderate Assistance - Patient 50 - 74% Assistive device: Walker-rolling Max distance: 78ft   Walk 10 feet activity   Assist     Assist level: Moderate Assistance - Patient - 50 - 74% Assistive device: Walker-rolling,Orthosis   Walk 50 feet activity   Assist Walk 50 feet with 2 turns activity did not occur: Safety/medical concerns  Assist level: Moderate Assistance - Patient - 50 - 74% Assistive device: Walker-rolling,Orthosis    Walk 150 feet activity   Assist Walk 150 feet activity did not occur: Safety/medical concerns  Walk 10 feet on uneven surface  activity   Assist Walk 10 feet on uneven surfaces activity did not occur: Safety/medical concerns         Wheelchair     Assist Will patient use wheelchair at discharge?: Yes Type of Wheelchair: Manual    Wheelchair assist level: Maximal Assistance - Patient  25 - 49% Max wheelchair distance: 10ft    Wheelchair 50 feet with 2 turns activity    Assist        Assist Level: Total Assistance - Patient < 25%   Wheelchair 150 feet activity     Assist      Assist Level: Total Assistance - Patient < 25%   Blood pressure (!) 155/78, pulse (!) 56, temperature 97.8 F (36.6 C), temperature source Oral, resp. rate 16, height 5\' 6"  (1.676 m), weight 81.9 kg, SpO2 99 %.    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 -patient may  shower -ELOS/Goals: sup/minA, 18-21d  -Continue CIR 2. Antithrombotics: -DVT/anticoagulation:Lovenox -antiplatelet therapy: Aspirin 325 mg daily and Plavix 75 mg daily x3 months then aspirin alone 3. Pain Management:All in all as needed 4. Mood:Provide emotional support -antipsychotic agents: N/A 5. Neuropsych: This patientis notcapable of making decisions on hisown behalf. 6. Skin/Wound Care:Routine skin checks 7. Fluids/Electrolytes/Nutrition:Routine in and outs with follow-up chemistries 8. Hypertension. Lisinopril 20 mg daily. Monitor with increased mobility Vitals:   05/23/20 1953 05/24/20 0523  BP: 136/85 (!) 155/78  Pulse: (!) 54 (!) 56  Resp: 16 16  Temp: 98.3 F (36.8 C) 97.8 F (36.6 C)  SpO2: 99% 99%  controlled 12/15- continue to monitor. , monitor orthostatsis on flomax   9. Dysphagia. Dysphagia #1 pudding thick liquid. Monitor hydration. Speech therapy follow-up 10. New findings diabetes mellitus. Hemoglobin A1c 8.7. Lantus insulin 10 units nightly. Diabetic teaching CBG (last 3)  Recent Labs    05/23/20 1611 05/23/20 2117 05/24/20 0546  GLUCAP 123* 117* 97  increase lantus to 15U- cont current dose   12/14: CBG ranging from 103- 150: continue to monitor.  11. Hyperlipidemia.  Lipitor 12. Ascending aortic aneurysm with dilation 4.1 cm. Recommendations annual imaging followed by CTA or MRA. 13.  Pre renal azotemia, IVF at noc repeat BMET in am  14.  Leukocytosis , afeb check UA C and S - 20K enterococcus now on macrobid 15.  Urinary retention appreciate uro assist- complete macrobid in 3 d, start flomax, will do voiding trial next week  16. Bradycardic to 50s: flowsheet reviewed, this is occasional, continue to monitor.  LOS: 9 days A FACE TO FACE EVALUATION WAS PERFORMED  1/15 05/24/2020, 8:51 AM

## 2020-05-24 NOTE — Progress Notes (Signed)
Occupational Therapy Weekly Progress Note  Patient Details  Name: Aaron Weiss MRN: [id number] Date of Birth: [birth year]  Beginning of progress report period: May 16, 2020 End of progress report period: May 24, 2020  Today's Date: 05/24/2020 OT Individual Time: 6659-9357 OT Individual Time Calculation (min): 45 min    Patient has met 3 of 4 short term goals.  Pt has made good progress this reporting period in siting balance improving to S overall for static sitting balance and MOD A for UB ADLs. Pt requires MAX A for sit to stands and +2 A for standing level clothing management. Overall for functional transfers via squat pivot with MOD A. Minimal return has happened with pts LUE, however some notable shoulder activation has occurred requiring increased skilled OT to improve functional gains.   Patient continues to demonstrate the following deficits: muscle weakness, decreased cardiorespiratoy endurance, impaired timing and sequencing, abnormal tone, unbalanced muscle activation, motor apraxia, decreased coordination and decreased motor planning, decreased visual acuity, decreased visual perceptual skills and decreased visual motor skills, decreased attention to right, decreased initiation, decreased attention, decreased awareness, decreased problem solving, decreased safety awareness, decreased memory and delayed processing and decreased sitting balance, decreased standing balance, decreased postural control, hemiplegia and decreased balance strategies and therefore will continue to benefit from skilled OT intervention to enhance overall performance with BADL and iADL.  Patient progressing toward long term goals..  Continue plan of care.  OT Short Term Goals Week 1:  OT Short Term Goal 1 (Week 1): patient will roll in bed with min A, move to sitting and lying positions with min a OT Short Term Goal 1 - Progress (Week 1): Met OT Short Term Goal 2 (Week 1): patient will complete  upper body bathing and dressing with min A OT Short Term Goal 2 - Progress (Week 1): Progressing toward goal OT Short Term Goal 3 (Week 1): patient will use right UE as gross stabilizer with mod cues and set up OT Short Term Goal 3 - Progress (Week 1): Met OT Short Term Goal 4 (Week 1): patient will sit unsupported with CS and complete sit pivot transfers with mod A OT Short Term Goal 4 - Progress (Week 1): Met  Skilled Theraupeutic Interventions/Progress Updates:    Pt received in w/c agreeable to OT. Once estim placed on deltoid total A tranport to tx gym. Pt completes seated WB activites into forearm/wrist and L hand for NMR and proprioceptive input into joints as well as dynamic sitting balance at [place]. Pt completes lateral reaching for bean bags in max ranges outside BOS laterally and from floor. Pt pushes theraball in max ranges outside BOS forward and diagonally for WB Into hand and trunk control. Exited session with pt seated in w/c, exit alarm on and call light in reach    [facility] Stim One 330 pulse width 35 Hz pulse rate On 8 sec/ off 8 sec Ramp up/ down 2 sec Symmetrical Biphasic wave form  Max intensity 170m at 500 Ohm load Applied to pt deltoid at 15 min into session as OT had to obtain unit as well as apply new gel pads. Good deltoid contraction obtained and left on for 1 hour (30 min attended; 30 unattended).  Therapy Documentation Precautions:  Precautions Precautions: Fall Precaution Comments: aphasia Restrictions Weight Bearing Restrictions: No General:   Vital Signs: Therapy Vitals Temp: 97.8 F (36.6 C) Temp Source: Oral Pulse Rate: (!) 56 Resp: 16 BP: (!) 155/78 Patient Position (if appropriate): Lying Oxygen  Therapy SpO2: 99 % O2 Device: Room Air Pain:   ADL: ADL Eating: Moderate assistance Where Assessed-Eating: Wheelchair Grooming: Minimal assistance Where Assessed-Grooming: Wheelchair Upper Body Bathing: Minimal assistance Where Assessed-Upper  Body Bathing: Wheelchair Lower Body Bathing: Moderate assistance Where Assessed-Lower Body Bathing: Chair Upper Body Dressing: Moderate assistance Where Assessed-Upper Body Dressing: Chair Lower Body Dressing: Maximal assistance Where Assessed-Lower Body Dressing: Chair Toileting: Dependent Where Assessed-Toileting: Bedside Commode Toilet Transfer: Moderate assistance Toilet Transfer Method: Squat pivot Toilet Transfer Equipment: Drop arm bedside commode ADL Comments: sit pivot transfer bed to/from w/c max A Vision   Perception    Praxis   Exercises:   Other Treatments:     Therapy/Group: Individual Therapy  Tonny Branch 05/24/2020, 6:55 AM

## 2020-05-24 NOTE — Patient Care Conference (Signed)
Inpatient RehabilitationTeam Conference and Plan of Care Update Date: 05/24/2020   Time: 10:15 AM    Patient Name: Aaron Weiss      Medical Record Number: [id number]  Date of Birth: Aug 11, 1965 Sex: Male         Room/Bed: 4W06C/4W06C-01 Payor Info: Payor: [employer]UE [employer]UE [employer] / Plan: BCBS COMM PPO / Product Type: *No Product type* /    Admit Date/Time:  05/15/2020  2:51 PM  Primary Diagnosis:  Cerebral infarction due to unspecified occlusion or stenosis of left middle cerebral artery [facility])  Hospital Problems: Principal Problem:   Cerebral infarction due to unspecified occlusion or stenosis of left middle cerebral artery (HCC) Active Problems:   Left middle cerebral artery stroke [facility])    Expected Discharge Date: Expected Discharge Date: 06/09/20  Team Members Present: Physician leading conference: Dr. Claudette Laws Care Coodinator Present: Chana Bode, RN, BSN, CRRN;Christina [place], BSW Nurse Present: Magdalen Spatz) [place], LPN PT Present: Harless Litten, PTA OT Present: Blanch Media, OT SLP Present: Suzzette Righter, CF-SLP PPS Coordinator present : Fae Pippin, [place], PT     Current Status/Progress Goal Weekly Team Focus  Bowel/Bladder             Swallow/Nutrition/ Hydration   Dys 1 textures, honey thick liquids (upgraded after MBS), Min-Mod compensatory swallow strategies  Supervision A least restrictive diet  Continue trials of Dys 2 to work toward advancing, carryover swallow strategies, tolerance or honey and potentially thin/ice trials to assess readiness for another MBS   ADL's   mod A UB, max LB with sit to stands dressing from EOB or BSC, mod A sq pivot to BSC, ,max toileting, some activation of R deltoid; improved postural control and midline orientation - supervision static sitting  MIn A overall  ADL training, R NMR with estim, functional mobility, postural control, pt/fam educ   Mobility   modA supine to sit, maxA sit to  supine, maxA squat pivot transfers, modA STS with RW, maxA gait x 67ft, improving sitting balance, improving functional use of RLE, minA w/c mobility  minA overall      Communication   Mod-Max  Min-Mod  naming functional objects, functional phrases, communicating basic wants and needs, awareness and correction verbal errors, following directions, complex yes/no answers   Safety/Cognition/ Behavioral Observations  Min-Mod  Min  Orientation and emergent awareness tasks   Pain             Skin               Discharge Planning:  Discharging home woth son. Lives in 2 level home (able to stay on 1st level) Level entry   Team Discussion: Global aphasia dysphagia post CVA with urinary retention. Foley replaced with hematuria noted and UTI treated with abx. Flomax also added per MD. Note labile BP and bradycardia being monitored per MD and DM is well controlled. Patient has better control of transfers but is still easily distracted which is easily corrected with verbal cues. Right LE control with activation as well. Patient on target to meet rehab goals: yes, currently mod assist for Upper body care and max assist for lower body care. Mod assist for toileting and min assist for w/c mobility. Mod assist to ambulate 16'.  *See Care Plan and progress notes for long and short-term goals.   Revisions to Treatment Plan:  MBS pending. SLP continues to work on swallowing with D2 diet trials /honey thick liquids Teaching Needs: Transfers, toileting, medications, etc.  Current Barriers to Discharge: Decreased caregiver support, nutritional means  Possible Resolutions to Barriers:  Family education Diet upgrade pending MBS    Medical Summary Current Status: severe aphasia, labile BP, incont bowel adn bladder, still req IVF for hydration  Barriers to Discharge: Medical stability   Possible Resolutions to Barriers/Weekly Focus: cont IVF, monitor BP adjust meds as needed   Continued Need for Acute  Rehabilitation Level of Care: The patient requires daily medical management by a physician with specialized training in physical medicine and rehabilitation for the following reasons: Direction of a multidisciplinary physical rehabilitation program to maximize functional independence : Yes Medical management of patient stability for increased activity during participation in an intensive rehabilitation regime.: Yes Analysis of laboratory values and/or radiology reports with any subsequent need for medication adjustment and/or medical intervention. : Yes   I attest that I was present, lead the team conference, and concur with the assessment and plan of the team.   Chana Bode B 05/24/2020, 12:59 PM

## 2020-05-25 ENCOUNTER — Inpatient Hospital Stay (HOSPITAL_COMMUNITY): Payer: BC Managed Care – PPO

## 2020-05-25 ENCOUNTER — Inpatient Hospital Stay (HOSPITAL_COMMUNITY): Payer: BC Managed Care – PPO | Admitting: Physical Therapy

## 2020-05-25 ENCOUNTER — Inpatient Hospital Stay (HOSPITAL_COMMUNITY): Payer: BC Managed Care – PPO | Admitting: Speech Pathology

## 2020-05-25 LAB — GLUCOSE, CAPILLARY
Glucose-Capillary: 94 mg/dL (ref 70–99)
Glucose-Capillary: 113 mg/dL — ABNORMAL HIGH (ref 70–99)
Glucose-Capillary: 110 mg/dL — ABNORMAL HIGH (ref 70–99)
Glucose-Capillary: 118 mg/dL — ABNORMAL HIGH (ref 70–99)

## 2020-05-25 LAB — BASIC METABOLIC PANEL
Anion gap: 9 (ref 5–15)
BUN: <5 mg/dL — ABNORMAL LOW (ref 6–20)
CO2: 28 mmol/L (ref 22–32)
Calcium: 9.1 mg/dL (ref 8.9–10.3)
Chloride: 102 mmol/L (ref 98–111)
Creatinine, Ser: 0.83 mg/dL (ref 0.61–1.24)
GFR, Estimated: >60 mL/min (ref >60)
Glucose, Bld: 112 mg/dL — ABNORMAL HIGH (ref 70–99)
Potassium: 3.1 mmol/L — ABNORMAL LOW (ref 3.5–5.1)
Sodium: 139 mmol/L (ref 135–145)

## 2020-05-25 MED ORDER — SENNOSIDES-DOCUSATE SODIUM 8.6-50 MG PO TABS
1.0000 | ORAL_TABLET | Freq: Every day | ORAL | Status: DC
  Administered 2020-05-25 – 2020-06-08 (×15): 1 via ORAL
  Filled 2020-05-25 (×15): qty 1

## 2020-05-25 MED ORDER — POTASSIUM CHLORIDE CRYS ER 20 MEQ PO TBCR
40.0000 meq | EXTENDED_RELEASE_TABLET | Freq: Two times a day (BID) | ORAL | Status: AC
  Administered 2020-05-25 (×2): 40 meq via ORAL
  Filled 2020-05-25 (×2): qty 2

## 2020-05-25 NOTE — Progress Notes (Signed)
[place] PHYSICAL MEDICINE & REHABILITATION PROGRESS NOTE   Subjective/Complaints: No complaints this morning. Denies pain,  insomnia.  Last BM a few days ago- scheduled senna-docusate at HS K+ low today  ROS- aphasia   Objective:   No results found. No results for input(s): WBC, HGB, HCT, PLT in the last 72 hours. Recent Labs    05/25/20 0503  NA 139  K 3.1*  CL 102  CO2 28  GLUCOSE 112*  BUN <5*  CREATININE 0.83  CALCIUM 9.1    Intake/Output Summary (Last 24 hours) at 05/25/2020 1215 Last data filed at 05/25/2020 0940 Gross per 24 hour  Intake 360 ml  Output 1475 ml  Net -1115 ml        Physical Exam: Vital Signs Blood pressure 120/66, pulse 63, temperature 97.8 F (36.6 C), resp. rate 18, height 5\' 6"  (1.676 m), weight 80.8 kg, SpO2 100 %. Gen: no distress, normal appearing HEENT: oral mucosa pink and moist, NCAT Cardio: Reg rate Chest: normal effort, normal rate of breathing Abd: soft, non-distended Ext: no edema Skin: intact  Neurologic: Cranial nerves II through XII intact, motor strength is 5/5 in left and 0/5 right  deltoid, bicep, tricep, grip, hip flexor, knee extensors, ankle dorsiflexor and plantar flexor Sensory exam cannot assess due to aphasia  Cerebellar exam cannot perform on Right due to we Tone without spasticity akness Musculoskeletal: Full range of motion in all 4 extremities. No joint swelling   Assessment/Plan: 1. Functional deficits which require 3+ hours per day of interdisciplinary therapy in a comprehensive inpatient rehab setting.  Physiatrist is providing close team supervision and 24 hour management of active medical problems listed below.  Physiatrist and rehab team continue to assess barriers to discharge/monitor patient progress toward functional and medical goals  Care Tool:  Bathing    Body parts bathed by patient: Chest,Abdomen,Front perineal area,Right upper leg,Left upper leg,Face,Left lower leg,Right arm    Body parts bathed by helper: Right arm,Left arm,Chest,Front perineal area,Buttocks,Right upper leg,Left upper leg,Right lower leg,Left lower leg Body parts n/a: Left arm,Right lower leg,Buttocks   Bathing assist Assist Level: Moderate Assistance - Patient 50 - 74%     Upper Body Dressing/Undressing Upper body dressing   What is the patient wearing?: Pull over shirt    Upper body assist Assist Level: Moderate Assistance - Patient 50 - 74%    Lower Body Dressing/Undressing Lower body dressing      What is the patient wearing?: Pants,Incontinence brief     Lower body assist Assist for lower body dressing: Maximal Assistance - Patient 25 - 49%     Toileting Toileting    Toileting assist Assist for toileting: Dependent - Patient 0%     Transfers Chair/bed transfer  Transfers assist     Chair/bed transfer assist level: Moderate Assistance - Patient 50 - 74%     Locomotion Ambulation   Ambulation assist      Assist level: Moderate Assistance - Patient 50 - 74% Assistive device: Walker-rolling Max distance: 35ft   Walk 10 feet activity   Assist     Assist level: Moderate Assistance - Patient - 50 - 74% Assistive device: Walker-rolling,Orthosis   Walk 50 feet activity   Assist Walk 50 feet with 2 turns activity did not occur: Safety/medical concerns  Assist level: Moderate Assistance - Patient - 50 - 74% Assistive device: Walker-rolling,Orthosis    Walk 150 feet activity   Assist Walk 150 feet activity did not occur: Safety/medical concerns  Walk 10 feet on uneven surface  activity   Assist Walk 10 feet on uneven surfaces activity did not occur: Safety/medical concerns         Wheelchair     Assist Will patient use wheelchair at discharge?: Yes Type of Wheelchair: Manual    Wheelchair assist level: Maximal Assistance - Patient 25 - 49% Max wheelchair distance: 70ft    Wheelchair 50 feet with 2 turns  activity    Assist        Assist Level: Total Assistance - Patient < 25%   Wheelchair 150 feet activity     Assist      Assist Level: Total Assistance - Patient < 25%   Blood pressure 120/66, pulse 63, temperature 97.8 F (36.6 C), resp. rate 18, height 5\' 6"  (1.676 m), weight 80.8 kg, SpO2 100 %.    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 -patient may  shower -ELOS/Goals: sup/minA, 18-21d  -Continue CIR 2. Antithrombotics: -DVT/anticoagulation:Lovenox -antiplatelet therapy: Aspirin 325 mg daily and Plavix 75 mg daily x3 months then aspirin alone 3. Pain Management:Tylenol prn 4. Mood:Provide emotional support -antipsychotic agents: N/A 5. Neuropsych: This patientis notcapable of making decisions on hisown behalf. 6. Skin/Wound Care:Routine skin checks 7. Fluids/Electrolytes/Nutrition:Routine in and outs with follow-up chemistries 8. Hypertension. Lisinopril 20 mg daily. Monitor with increased mobility Vitals:   05/25/20 0435 05/25/20 0833  BP: 117/68 120/66  Pulse: 63   Resp: 18   Temp: 97.8 F (36.6 C)   SpO2: 100%   controlled 12/16- continue to monitor, monitor orthostatsis on flomax   9. Dysphagia. Dysphagia #1 pudding thick liquid. Monitor hydration. Speech therapy follow-up 10. New findings diabetes mellitus. Hemoglobin A1c 8.7. Lantus insulin 10 units nightly. Diabetic teaching CBG (last 3)  Recent Labs    05/24/20 2111 05/25/20 0618 05/25/20 1143  GLUCAP 124* 94 113*  increase lantus to 15U- cont current dose   12/14: CBG ranging from 103- 150: continue to monitor.  11. Hyperlipidemia. Lipitor 12. Ascending aortic aneurysm with dilation 4.1 cm. Recommendations annual imaging followed by CTA or MRA. 13.  Pre renal azotemia, IVF at noc repeat  BMET in am  14.  Leukocytosis , afeb check UA C and S - 20K enterococcus now on macrobid 15.  Urinary retention appreciate uro assist- complete macrobid in 3 d, start flomax, will do voiding trial next week  16. Bradycardic to 50s: flowsheet reviewed, this is occasional, continue to monitor.  17. Constipation: schedule senna-docusate HS LOS: 10 days A FACE TO FACE EVALUATION WAS PERFORMED  1/15 P Liat Mayol 05/25/2020, 12:15 PM

## 2020-05-25 NOTE — Progress Notes (Signed)
Physical Therapy Session Note  Patient Details  Name: Aaron Weiss MRN: [id number] Date of Birth: 05/06/1966  Today's Date: 05/25/2020 PT Individual Time: 1000-1100 PT Individual Time Calculation (min): 60 min   Short Term Goals: Week 2:  PT Short Term Goal 1 (Week 2): pt will ambulate 9f with min assist and LRAD PT Short Term Goal 2 (Week 2): Pt will perform stand pivot transfers with mod assist PT Short Term Goal 3 (Week 2): Pt will perform swing pivot transfers with min assist or better in both directions PT Short Term Goal 4 (Week 2): Pt will demonstrate increased activation of his R glutes/quads and demonstrate carryover into gait training  Skilled Therapeutic Interventions/Progress Updates:    Pt received semi reclined in bed and agreeable to PT. PT donned ted hose, shoes and AFO in supine min assist on L, total assist on R. Pt performed rolling to R with CGA and sidelying>sit with min assist for LE management and positioning. PT demonstrated stand pivot transfer to wc on L with RW, then patient performed with max assist for RLE management, multimodal cueing and facilitation of R hamstrings/weight shift necessary for successful transfer. Stand>sit with min assist. Pt transported to parallel bars total assist for time management and energy conservation.   Standing reaching task in parallel bars: -pt instructed to stand, then shift weight onto RLE and reach with L hand to obtain objects from second helper on R side. Pt requiring mod assist for R hip extension, R knee blocking and facilitation of weight shift. Second person required in order to provide min assist for WB on parallel bar with R hand in wrist extension.  -2x6 with emphasis on R visual scanning to find objects.   NMR: -3 musketeer gait 2x334fat max +2 for RLE blocking and prevention of adduction, facilitation of weight shift in both directions, and trunk posturing. Pt cued to take smaller steps with R foot in order to  allow for necessary hip extension on R while clearing L foot.  -gait with RW and mod assist with mirror placed for visual feedback. PT provided mod assist for trunk posturing, AD management, RLE blocking, and prevention of adduction on R. +2 present for wc follow  WC Propulsion: -Pt propelled wc ~17525fsing hemi technique with moderate verbal cueing as he continues to veer R due to his R inattention. Pt needs minor cueing in order to coordinate his hand and foot especially in distractible areas.   Pt returned to room and performed stand pivot transfer to L in similar fashion as above at start of session. Pt has great difficulty activating his R hamstrings and glutes in order to bring his R foot back when making turn. Pt returned to sitting with min assist and then to supine with min assist for RLE management. PT doffed shoes and AFO in supine with total assist. Pt left semi reclined in bed with bed alarm on, needs met, and call bell in reach. No pain reported this session.  Therapy Documentation Precautions:  Precautions Precautions: Fall Precaution Comments: aphasia Restrictions Weight Bearing Restrictions: No    Therapy/Group: Individual Therapy  BaiGaylord Shih/16/2021, 7:30 AM

## 2020-05-25 NOTE — Progress Notes (Signed)
Occupational Therapy Session Note  Patient Details  Name: Tiara Bartoli MRN: [id number] Date of Birth: 30-Apr-1966  Today's Date: 05/25/2020 OT Individual Time: 1400-1515 OT Individual Time Calculation (min): 75 min    Short Term Goals: Week 1:  OT Short Term Goal 1 (Week 1): patient will roll in bed with min A, move to sitting and lying positions with min a OT Short Term Goal 1 - Progress (Week 1): Met OT Short Term Goal 2 (Week 1): patient will complete upper body bathing and dressing with min A OT Short Term Goal 2 - Progress (Week 1): Progressing toward goal OT Short Term Goal 3 (Week 1): patient will use right UE as gross stabilizer with mod cues and set up OT Short Term Goal 3 - Progress (Week 1): Met OT Short Term Goal 4 (Week 1): patient will sit unsupported with CS and complete sit pivot transfers with mod A OT Short Term Goal 4 - Progress (Week 1): Met  Skilled Therapeutic Interventions/Progress Updates:    1;1. Pt received in bed agreeable to OT. Pt completes supine>sitting with MAX VC for using R bed rail for pushing up to midline in standing with total LOB posteriorly in sitting. Pt completes MOD A squat pivot after OT dons shoes/R AFO total A. Pt completes standing statically with R knee block and foam wedge under R forearm for WB/approximating of shoulder in standing while standing to complete simple alternating peg board pattern. Pt requires total cuing to alternate colors and name colors. Pt able to stand statically as stated above and sort colors on ped board with 100% accuracy scanning R to locate pegs past R hand. Pt does not need cues for head turns R to locate peg bin at tabletop in prep for functional sorting activity. Pt stands as stated above with increased suing to not stabilize hips on table top with same assist as above to sort silverware (straws, spoons, forks and measuring spoons) into silverware organizer pockets with initially max cues fading to mod cues  reaching in all directions on tabletop to obtain items. Exited session with pt seated in bed, exit alarm on and call light in reach   Therapy Documentation Precautions:  Precautions Precautions: Fall Precaution Comments: aphasia Restrictions Weight Bearing Restrictions: No General:   Vital Signs:   Pain: Pain Assessment Pain Scale: 0-10 Pain Score: 0-No pain ADL: ADL Eating: Moderate assistance Where Assessed-Eating: Wheelchair Grooming: Minimal assistance Where Assessed-Grooming: Wheelchair Upper Body Bathing: Minimal assistance Where Assessed-Upper Body Bathing: Wheelchair Lower Body Bathing: Moderate assistance Where Assessed-Lower Body Bathing: Chair Upper Body Dressing: Moderate assistance Where Assessed-Upper Body Dressing: Chair Lower Body Dressing: Maximal assistance Where Assessed-Lower Body Dressing: Chair Toileting: Dependent Where Assessed-Toileting: Bedside Commode Toilet Transfer: Moderate assistance Toilet Transfer Method: Squat pivot Toilet Transfer Equipment: Drop arm bedside commode ADL Comments: sit pivot transfer bed to/from w/c max A Vision   Perception    Praxis   Exercises:   Other Treatments:     Therapy/Group: Individual Therapy  Tonny Branch 05/25/2020, 12:45 PM

## 2020-05-26 ENCOUNTER — Inpatient Hospital Stay (HOSPITAL_COMMUNITY): Payer: BC Managed Care – PPO | Admitting: Physical Therapy

## 2020-05-26 ENCOUNTER — Inpatient Hospital Stay (HOSPITAL_COMMUNITY): Payer: BC Managed Care – PPO | Admitting: Occupational Therapy

## 2020-05-26 ENCOUNTER — Inpatient Hospital Stay (HOSPITAL_COMMUNITY): Payer: BC Managed Care – PPO | Admitting: Speech Pathology

## 2020-05-26 LAB — GLUCOSE, CAPILLARY
Glucose-Capillary: 106 mg/dL — ABNORMAL HIGH (ref 70–99)
Glucose-Capillary: 113 mg/dL — ABNORMAL HIGH (ref 70–99)
Glucose-Capillary: 92 mg/dL (ref 70–99)
Glucose-Capillary: 106 mg/dL — ABNORMAL HIGH (ref 70–99)

## 2020-05-26 LAB — BASIC METABOLIC PANEL
Anion gap: 9 (ref 5–15)
BUN: 5 mg/dL — ABNORMAL LOW (ref 6–20)
CO2: 27 mmol/L (ref 22–32)
Calcium: 8.9 mg/dL (ref 8.9–10.3)
Chloride: 102 mmol/L (ref 98–111)
Creatinine, Ser: 0.81 mg/dL (ref 0.61–1.24)
GFR, Estimated: >60 mL/min (ref >60)
Glucose, Bld: 104 mg/dL — ABNORMAL HIGH (ref 70–99)
Potassium: 3.5 mmol/L (ref 3.5–5.1)
Sodium: 138 mmol/L (ref 135–145)

## 2020-05-26 NOTE — Progress Notes (Signed)
Physical Therapy Session Note  Patient Details  Name: Aaron Weiss MRN: [id number] Date of Birth: 15-Apr-1966  Today's Date: 05/26/2020 PT Individual Time: 0800-0900 PT Individual Time Calculation (min): 60 min   Short Term Goals: Week 2:  PT Short Term Goal 1 (Week 2): pt will ambulate 62f with min assist and LRAD PT Short Term Goal 2 (Week 2): Pt will perform stand pivot transfers with mod assist PT Short Term Goal 3 (Week 2): Pt will perform swing pivot transfers with min assist or better in both directions PT Short Term Goal 4 (Week 2): Pt will demonstrate increased activation of his R glutes/quads and demonstrate carryover into gait training  Skilled Therapeutic Interventions/Progress Updates:    Pt received supine in bed and agreeable to PT. Supine>sit min assist for RLE management and erect trunk/hip position. Pt IV line caught on pillow, causing it to be removed. Nurse tech present at time of incident. Pt LUE demonstrating increased edema and skin tension. Nurse tech made nursing aware. Sit>stand with min assist for balance and RLE blocking. Stand pivot transfer to wc on L with max assist for AD management, RLE blocking, facilitation for RLE due to poor motor planning, and weight shift to L. Pt with constant verbal cueing throughout transfer. Pt propelled wc ~1536fto gym with supervision and moderate verbal cueing due to R inattention.   Standing Reaching Task staggered stance bilaterally with RW in front: -Pt instructed to reach and obtain horseshoes off of top of mirror placed in front of him for visual feedback. 2x6 with R foot forward and 1x6 with L foot forward.  -When standing with R foot forward, pt very apprehensive about shifting weight onto RLE despite PT providing RLE blocking and trunk support.  -Pt requires max assist to provide RLE blocking, trunk control, and weight shift through pelvis in order to encourage RLE weight bearing and hip extension. HEAVY R knee block  required  Gait @ L Railing w/ mirror for visual feedback x2: -Pt ambulated [id number] with mod assist for RLE blocking/placement and trunk control. Pt able to advance RLE, however tends to adduct, so PT provided facilitation to maintain WBOS.  -Pt ambulated [id number] with max assist for RLE blocking, facilitation for knee flexion and foot placement, heavy verbal cueing for WBOS, and facilitation of weight shift in both directions.  Pt transported to room total assist. Stand pivot transfer to L with mod assist for AD management, RLE blocking, and heavy verbal cueing during turn. Min assist provided for placing foot. Sit>supine min assist for RLE management. Pt left supine in bed with needs met, call bell in reach, and bed alarm on.    Therapy Documentation Precautions:  Precautions Precautions: Fall Precaution Comments: aphasia Restrictions Weight Bearing Restrictions: No    Therapy/Group: Individual Therapy  BailGaylord Shih17/2021, 8:00 AM

## 2020-05-26 NOTE — Progress Notes (Signed)
PHYSICAL MEDICINE & REHABILITATION PROGRESS NOTE   Subjective/Complaints: No complaints this morning. Denies pain [place] to gym. IV came out this morning left arm swollen but nontender to palpation  ROS: denies pain  Objective:   No results found. No results for input(s): WBC, HGB, HCT, PLT in the last 72 hours. Recent Labs    05/25/20 0503 05/26/20 0512  NA 139 138  K 3.1* 3.5  CL 102 102  CO2 28 27  GLUCOSE 112* 104*  BUN <5* 5*  CREATININE 0.83 0.81  CALCIUM 9.1 8.9    Intake/Output Summary (Last 24 hours) at 05/26/2020 1047 Last data filed at 05/26/2020 0739 Gross per 24 hour  Intake 4356.39 ml  Output 1275 ml  Net 3081.39 ml        Physical Exam: Vital Signs Blood pressure (!) 167/84, pulse 65, temperature (!) 97.5 F (36.4 C), resp. rate 16, height 5\' 6"  (1.676 m), weight 80.8 kg, SpO2 100 %. Gen: no distress, normal appearing HEENT: oral mucosa pink and moist, NCAT Cardio: Reg rate Chest: normal effort, normal rate of breathing Abd: soft, non-distended Ext: no edema  Neurologic: Cranial nerves II through XII intact, motor strength is 5/5 in left and 0/5 right  deltoid, bicep, tricep, grip, hip flexor, knee extensors, ankle dorsiflexor and plantar flexor Sensory exam cannot assess due to aphasia  Cerebellar exam cannot perform on Right due to we Tone without spasticity akness Musculoskeletal: Full range of motion in all 4 extremities. No joint swelling   Assessment/Plan: 1. Functional deficits which require 3+ hours per day of interdisciplinary therapy in a comprehensive inpatient rehab setting.  Physiatrist is providing close team supervision and 24 hour management of active medical problems listed below.  Physiatrist and rehab team continue to assess barriers to discharge/monitor patient progress toward functional and medical goals  Care Tool:  Bathing    Body parts bathed by patient: Chest,Abdomen,Front perineal area,Right  upper leg,Left upper leg,Face,Left lower leg,Right arm   Body parts bathed by helper: Right arm,Left arm,Chest,Front perineal area,Buttocks,Right upper leg,Left upper leg,Right lower leg,Left lower leg Body parts n/a: Left arm,Right lower leg,Buttocks   Bathing assist Assist Level: Moderate Assistance - Patient 50 - 74%     Upper Body Dressing/Undressing Upper body dressing   What is the patient wearing?: Pull over shirt    Upper body assist Assist Level: Moderate Assistance - Patient 50 - 74%    Lower Body Dressing/Undressing Lower body dressing      What is the patient wearing?: Pants,Incontinence brief     Lower body assist Assist for lower body dressing: Maximal Assistance - Patient 25 - 49%     Toileting Toileting    Toileting assist Assist for toileting: Dependent - Patient 0%     Transfers Chair/bed transfer  Transfers assist     Chair/bed transfer assist level: Moderate Assistance - Patient 50 - 74%     Locomotion Ambulation   Ambulation assist      Assist level: Moderate Assistance - Patient 50 - 74% Assistive device: Walker-rolling Max distance: 90ft   Walk 10 feet activity   Assist     Assist level: Moderate Assistance - Patient - 50 - 74% Assistive device: Walker-rolling,Orthosis   Walk 50 feet activity   Assist Walk 50 feet with 2 turns activity did not occur: Safety/medical concerns  Assist level: Moderate Assistance - Patient - 50 - 74% Assistive device: Walker-rolling,Orthosis    Walk 150 feet activity   Assist Walk 150  feet activity did not occur: Safety/medical concerns         Walk 10 feet on uneven surface  activity   Assist Walk 10 feet on uneven surfaces activity did not occur: Safety/medical concerns         Wheelchair     Assist Will patient use wheelchair at discharge?: Yes Type of Wheelchair: Manual    Wheelchair assist level: Maximal Assistance - Patient 25 - 49% Max wheelchair distance: 77ft     Wheelchair 50 feet with 2 turns activity    Assist        Assist Level: Total Assistance - Patient < 25%   Wheelchair 150 feet activity     Assist      Assist Level: Total Assistance - Patient < 25%   Blood pressure (!) 167/84, pulse 65, temperature (!) 97.5 F (36.4 C), resp. rate 16, height 5\' 6"  (1.676 m), weight 80.8 kg, SpO2 100 %.    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 -patient may  shower -ELOS/Goals: sup/minA, 18-21d  -Continue CIR 2. Antithrombotics: -DVT/anticoagulation:Lovenox -antiplatelet therapy: Aspirin 325 mg daily and Plavix 75 mg daily x3 months then aspirin alone 3. Pain Management:Tylenol prn 4. Mood:Provide emotional support -antipsychotic agents: N/A 5. Neuropsych: This patientis notcapable of making decisions on hisown behalf. 6. Skin/Wound Care:Routine skin checks 7. Fluids/Electrolytes/Nutrition:Routine in and outs with follow-up chemistries 8. Hypertension. Lisinopril 20 mg daily. Monitor with increased mobility Vitals:   05/25/20 1939 05/26/20 0539  BP: (!) 178/87 (!) 167/84  Pulse: 67 65  Resp: 17 16  Temp: 98.1 F (36.7 C) (!) 97.5 F (36.4 C)  SpO2: 98% 100%  elevated 12/17 but was previously very well controlled, continue to monitor, monitor orthostatsis on flomax  9. Dysphagia. Dysphagia #1 pudding thick liquid. Monitor hydration. Speech therapy follow-up 10. New findings diabetes mellitus. Hemoglobin A1c 8.7. Lantus insulin 10 units nightly. Diabetic teaching CBG (last 3)  Recent Labs    05/25/20 1629 05/25/20 2047 05/26/20 0557  GLUCAP 110* 118* 106*  increase lantus to 15U- cont current dose   12/17: CBGs very well controlled- continue to monitor.  11. Hyperlipidemia. Lipitor 12. Ascending aortic aneurysm  with dilation 4.1 cm. Recommendations annual imaging followed by CTA or MRA. 13.  Pre renal azotemia, IVF at noc repeat BMET in am  14.  Leukocytosis , afeb check UA C and S - 20K enterococcus now on macrobid 15.  Urinary retention appreciate uro assist- complete macrobid in 3 d, start flomax, will do voiding trial next week  16. Bradycardic to 50s: normal HR on 12/17: continue to monitor 17. Constipation: schedule senna-docusate HS LOS: 11 days A FACE TO FACE EVALUATION WAS PERFORMED  1/18 Eirik Schueler 05/26/2020, 10:47 AM

## 2020-05-26 NOTE — Progress Notes (Signed)
Patients peripheral IV in left forearm  came out during PT this AM by getting stuck between him and a pillow, no blood coming from arm when IV came out only fluid report to me from therapist. Assessed Pts arm no redness, or swelling at IV site. Pt reports no pain, order put in for new IV to be done by the IV team.   Aaron Weiss

## 2020-05-26 NOTE — Progress Notes (Signed)
Occupational Therapy Session Note  Patient Details  Name: Aaron Weiss MRN: [id number] Date of Birth: September 21, 1965  Today's Date: 05/26/2020 OT Individual Time: 9326-7124 OT Individual Time Calculation (min): 58 min   Short Term Goals: Week 2:  OT Short Term Goal 1 (Week 2): Pt will maintain dynamic sitting balance with S overall OT Short Term Goal 2 (Week 2): Pt will complete UB bathing with MIN A OT Short Term Goal 3 (Week 2): Pt will locate 2/3 items on L side of sink to demo improved L inattention OT Short Term Goal 4 (Week 2): Pt will sit to stnad wiht MOD A overall in prep for toileting/LB dressing  Skilled Therapeutic Interventions/Progress Updates:    Pt greeted in bed with no s/s pain. Used communication board and also yes/no questions to discuss his needs, pt expressing that he did not need to toilet and that he already completed bathing/dressing tasks. Supine<sit completed with Min A to position the Rt LE EOB (pt already wearing his Rt AFO). For remainder of tx worked on Rt sided NMR via various activities. Started with pt participating in isolated grasp/release and reaching grasp/release tasks with Saebo E-stim applied to forearm extensors. Pt requiring supervision for static and dynamic sitting balance, reaching across midline and outside of base of support to transfer items into bins at times. Pt actively assisting his Rt UE (with OT assistance as well) to facilitate movement, pt supporting his affected arm by the elbow. Transferred e-stim onto his anterior/posterior deltoids and placed affected hand in a weightbearing position while pt reached outside of base of support on the Rt side in order to touch a target. Cues for active Rt arm activation when transitioning from Rt elbow<hand. Pt tolerating e-stim for 45 minutes today, skin intact once pads were removed. E-stim parameters are listed below. CGA for lateral scooting up towards HOB and Min A for transition to supine. OT  positioned his feet in bridged position so that he could improve alignment of hips. Rolling Rt>Lt for chuck pad repositioning completed with Mod-Max A respectively. OT assisted pt with repositioning to protect hemiplegic side and issued him with an elbow protector, Rt foot placed in [facility] boot as well. Left him with all needs within reach and bed alarm set.   Saebo Stim One 330 pulse width 35 Hz pulse rate On 8 sec/ off 8 sec Ramp up/ down 2 sec Symmetrical Biphasic wave form  Max intensity at 500 Ohm load    Therapy Documentation Precautions:  Precautions Precautions: Fall Precaution Comments: aphasia Restrictions Weight Bearing Restrictions: No Vital Signs: Therapy Vitals Temp: 98.3 F (36.8 C) Temp Source: Oral Pulse Rate: 78 Resp: 20 BP: (!) 149/86 Patient Position (if appropriate): Lying Oxygen Therapy SpO2: 99 % O2 Device: Room Air ADL: ADL Eating: Moderate assistance Where Assessed-Eating: Wheelchair Grooming: Minimal assistance Where Assessed-Grooming: Wheelchair Upper Body Bathing: Minimal assistance Where Assessed-Upper Body Bathing: Wheelchair Lower Body Bathing: Moderate assistance Where Assessed-Lower Body Bathing: Chair Upper Body Dressing: Moderate assistance Where Assessed-Upper Body Dressing: Chair Lower Body Dressing: Maximal assistance Where Assessed-Lower Body Dressing: Chair Toileting: Dependent Where Assessed-Toileting: Bedside Commode Toilet Transfer: Moderate assistance Toilet Transfer Method: Squat pivot Toilet Transfer Equipment: Drop arm bedside commode ADL Comments: sit pivot transfer bed to/from w/c max A      Therapy/Group: Individual Therapy  Chaniyah Jahr A Lavonya Hoerner 05/26/2020, 3:39 PM

## 2020-05-26 NOTE — Progress Notes (Signed)
Speech Language Pathology Daily Session Note  Patient Details  Name: Aaron Weiss MRN: [id number] Date of Birth: 1965/07/01  Today's Date: 05/26/2020 SLP Individual Time: 1000-1055 SLP Individual Time Calculation (min): 55 min  Short Term Goals: Week 2: SLP Short Term Goal 1 (Week 2): Pt will consume trials of Dys 2 textures across at least 2 sessions with efficient mastication and oral clearance with no more than Min A cues for use of compensatory swallow strategies prior to advancement. SLP Short Term Goal 2 (Week 2): Pt will consume therapeutic trials of ice/thin H2O after oral care with minimal overt s/sx aspiration X3 prior to repeat instrumental testing of swallow function. SLP Short Term Goal 3 (Week 2): Pt will match words to objects with 80% accuracy with Mod A multimodal cues. SLP Short Term Goal 4 (Week 2): Pt will name common functional objects with 75% accuracy provided Mod A cues. SLP Short Term Goal 5 (Week 2): Pt will use strategies to increase his speech intelligibility with Min A cues. SLP Short Term Goal 6 (Week 2): Pt will demonstrate awareness of verbal errors in 75% of opportunities provided Mod A cues/feedback.  Skilled Therapeutic Interventions: Pt was seen for skilled ST targeting dysphagia and communication goals. Two of pt's family were present for session, education provided regarding pt's current swallow and speech/language function provided throughout session. After oral care, pt accepted trials of ice and thin H2O from SLP. No overt s/sx aspiration noted across 10 ice chips, however immediate cough response noted in 2/5 tsp of thin H2O. Pt politely declined any honey or D2 intake opportunities. Recommend continue current diet and ST will continue to work toward readiness of repeat MBSS prior to liquid advancement. Pt required Max A multimodal cues to for functional use of his 6 picture communication board today (1/6 correct). During automatic sequences (targeted  to increase accuracy in verbal output) did count 1-10, state days of the week and months of the year with 100% accuracy provided Min A phonemic cues. He participated in phrase closure tasks with picture supports with ~75% accuracy provided Mod A phonemic and semantic cues. Pt became labile intermittently throughout session when family was talking about d/c plans or SLP discussing current function and prognosis, indicative of increasing awareness. Emotional support and encouragement provided. Pt left laying in bed with alarm set and needs within reach. Continue per current plan of care.       Pain Pain Assessment Pain Scale: 0-10 Pain Score: 0-No pain  Therapy/Group: Individual Therapy  Aaron Weiss 05/26/2020, 7:26 AM

## 2020-05-27 ENCOUNTER — Inpatient Hospital Stay (HOSPITAL_COMMUNITY): Payer: BC Managed Care – PPO

## 2020-05-27 LAB — GLUCOSE, CAPILLARY
Glucose-Capillary: 95 mg/dL (ref 70–99)
Glucose-Capillary: 122 mg/dL — ABNORMAL HIGH (ref 70–99)
Glucose-Capillary: 105 mg/dL — ABNORMAL HIGH (ref 70–99)
Glucose-Capillary: 127 mg/dL — ABNORMAL HIGH (ref 70–99)

## 2020-05-27 NOTE — Progress Notes (Signed)
[place] PHYSICAL MEDICINE & REHABILITATION PROGRESS NOTE   Subjective/Complaints:  Pt reports no ssies- doesn't know when had LBM even though had this AM   ROS: unable to assess due to aphasia  Objective:   No results found. No results for input(s): WBC, HGB, HCT, PLT in the last 72 hours. Recent Labs    05/25/20 0503 05/26/20 0512  NA 139 138  K 3.1* 3.5  CL 102 102  CO2 28 27  GLUCOSE 112* 104*  BUN <5* 5*  CREATININE 0.83 0.81  CALCIUM 9.1 8.9    Intake/Output Summary (Last 24 hours) at 05/27/2020 1350 Last data filed at 05/27/2020 0700 Gross per 24 hour  Intake 1512.14 ml  Output 1275 ml  Net 237.14 ml        Physical Exam: Vital Signs Blood pressure 122/77, pulse 96, temperature 98.1 F (36.7 C), resp. rate 16, height 5\' 6"  (1.676 m), weight 80.8 kg, SpO2 99 %. Gen: siting up in w/c at bedside, NAD HEENT: oral mucosa pink and moist, NCAT Cardio: borderline tachycardia Chest: CTA B/L- no W/R/R- good air movement Abd: Soft, NT, ND, (+)BS  Ext: no edema  Neurologic: Cranial nerves II through XII intact, motor strength is 5/5 in left and 0/5 right  deltoid, bicep, tricep, grip, hip flexor, knee extensors, ankle dorsiflexor and plantar flexor Sensory exam cannot assess due to aphasia  Cerebellar exam cannot perform on Right due to we Tone without spasticity akness Musculoskeletal: Full range of motion in all 4 extremities. No joint swelling   Assessment/Plan: 1. Functional deficits which require 3+ hours per day of interdisciplinary therapy in a comprehensive inpatient rehab setting.  Physiatrist is providing close team supervision and 24 hour management of active medical problems listed below.  Physiatrist and rehab team continue to assess barriers to discharge/monitor patient progress toward functional and medical goals  Care Tool:  Bathing    Body parts bathed by patient: Chest,Abdomen,Front perineal area,Right upper leg,Left upper  leg,Face,Left lower leg,Right arm   Body parts bathed by helper: Right arm,Left arm,Chest,Front perineal area,Buttocks,Right upper leg,Left upper leg,Right lower leg,Left lower leg Body parts n/a: Left arm,Right lower leg,Buttocks   Bathing assist Assist Level: Moderate Assistance - Patient 50 - 74%     Upper Body Dressing/Undressing Upper body dressing   What is the patient wearing?: Pull over shirt    Upper body assist Assist Level: Moderate Assistance - Patient 50 - 74%    Lower Body Dressing/Undressing Lower body dressing      What is the patient wearing?: Pants,Incontinence brief     Lower body assist Assist for lower body dressing: Maximal Assistance - Patient 25 - 49%     Toileting Toileting    Toileting assist Assist for toileting: Dependent - Patient 0%     Transfers Chair/bed transfer  Transfers assist     Chair/bed transfer assist level: Moderate Assistance - Patient 50 - 74%     Locomotion Ambulation   Ambulation assist      Assist level: Moderate Assistance - Patient 50 - 74% Assistive device: Walker-rolling Max distance: 41ft   Walk 10 feet activity   Assist     Assist level: Moderate Assistance - Patient - 50 - 74% Assistive device: Walker-rolling,Orthosis   Walk 50 feet activity   Assist Walk 50 feet with 2 turns activity did not occur: Safety/medical concerns  Assist level: Moderate Assistance - Patient - 50 - 74% Assistive device: Walker-rolling,Orthosis    Walk 150 feet activity  Assist Walk 150 feet activity did not occur: Safety/medical concerns         Walk 10 feet on uneven surface  activity   Assist Walk 10 feet on uneven surfaces activity did not occur: Safety/medical concerns         Wheelchair     Assist Will patient use wheelchair at discharge?: Yes Type of Wheelchair: Manual    Wheelchair assist level: Maximal Assistance - Patient 25 - 49% Max wheelchair distance: 88ft    Wheelchair 50  feet with 2 turns activity    Assist        Assist Level: Total Assistance - Patient < 25%   Wheelchair 150 feet activity     Assist      Assist Level: Total Assistance - Patient < 25%   Blood pressure 122/77, pulse 96, temperature 98.1 F (36.7 C), resp. rate 16, height 5\' 6"  (1.676 m), weight 80.8 kg, SpO2 99 %.    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 -patient may  shower -ELOS/Goals: sup/minA, 18-21d  -Continue CIR 2. Antithrombotics: -DVT/anticoagulation:Lovenox -antiplatelet therapy: Aspirin 325 mg daily and Plavix 75 mg daily x3 months then aspirin alone 3. Pain Management:Tylenol prn 4. Mood:Provide emotional support -antipsychotic agents: N/A 5. Neuropsych: This patientis notcapable of making decisions on hisown behalf. 6. Skin/Wound Care:Routine skin checks 7. Fluids/Electrolytes/Nutrition:Routine in and outs with follow-up chemistries 8. Hypertension. Lisinopril 20 mg daily. Monitor with increased mobility Vitals:   05/27/20 1310 05/27/20 1341  BP: 127/85 122/77  Pulse: 85 96  Resp: 16 16  Temp: 97.9 F (36.6 C) 98.1 F (36.7 C)  SpO2: 100% 99%  elevated 12/17 but was previously very well controlled, continue to monitor, monitor orthostatsis on flomax  12/18- BP well controlled in last 24 hours- con't regimen  9. Dysphagia. Dysphagia #1 pudding thick liquid. Monitor hydration. Speech therapy follow-up 10. New findings diabetes mellitus. Hemoglobin A1c 8.7. Lantus insulin 10 units nightly. Diabetic teaching CBG (last 3)  Recent Labs    05/26/20 2120 05/27/20 0608 05/27/20 1209  GLUCAP 106* 95 122*  increase lantus to 15U- cont current dose   12/17: CBGs very well controlled- continue to monitor.   12/18- BGs 95-122- con't regimen 11.  Hyperlipidemia. Lipitor 12. Ascending aortic aneurysm with dilation 4.1 cm. Recommendations annual imaging followed by CTA or MRA. 13.  Pre renal azotemia, IVF at noc repeat BMET in am  14.  Leukocytosis , afeb check UA C and S - 20K enterococcus now on macrobid 15.  Urinary retention appreciate uro assist- complete macrobid in 3 d, start flomax, will do voiding trial next week  16. Bradycardic to 50s: normal HR on 12/17: continue to monitor 17. Constipation: schedule senna-docusate HS  12/18- LBM this AM- con't regimen LOS: 12 days A FACE TO FACE EVALUATION WAS PERFORMED  Aaron Weiss 05/27/2020, 1:50 PM

## 2020-05-28 LAB — GLUCOSE, CAPILLARY
Glucose-Capillary: 112 mg/dL — ABNORMAL HIGH (ref 70–99)
Glucose-Capillary: 81 mg/dL (ref 70–99)
Glucose-Capillary: 93 mg/dL (ref 70–99)
Glucose-Capillary: 93 mg/dL (ref 70–99)

## 2020-05-28 MED ORDER — SODIUM CHLORIDE 0.45 % IV SOLN: INTRAVENOUS | Status: DC

## 2020-05-28 MED ORDER — SODIUM CHLORIDE 0.45 % IV SOLN: INTRAVENOUS | Status: AC

## 2020-05-28 NOTE — Progress Notes (Signed)
[place] PHYSICAL MEDICINE & REHABILITATION PROGRESS NOTE   Subjective/Complaints:  OK- no issues- getting 75cc/hour IVFs-  LBM yesterday- incontinent.     ROS: limited due to aphasia  Objective:   No results found. No results for input(s): WBC, HGB, HCT, PLT in the last 72 hours. Recent Labs    05/26/20 0512  NA 138  K 3.5  CL 102  CO2 27  GLUCOSE 104*  BUN 5*  CREATININE 0.81  CALCIUM 8.9    Intake/Output Summary (Last 24 hours) at 05/28/2020 1239 Last data filed at 05/28/2020 0700 Gross per 24 hour  Intake 1906.33 ml  Output 1700 ml  Net 206.33 ml        Physical Exam: Vital Signs Blood pressure 120/80, pulse 64, temperature 98.3 F (36.8 C), temperature source Oral, resp. rate 16, height 5\' 6"  (1.676 m), weight 80.8 kg, SpO2 96 %. Gen: sitting up in bed- appropriate, aphasic, NAD HEENT: oral mucosa pink and moist, NCAT Cardio: RRR Chest: CTA B/L- no W/R/R- good air movement Abd: Soft, NT, ND, (+)BS  Ext: no edema Neurologic: Cranial nerves II through XII intact, motor strength is 5/5 in left and 0/5 right  deltoid, bicep, tricep, grip, hip flexor, knee extensors, ankle dorsiflexor and plantar flexor Sensory exam cannot assess due to aphasia  Cerebellar exam cannot perform on Right due to we Tone without spasticity akness Musculoskeletal: Full range of motion in all 4 extremities. No joint swelling   Assessment/Plan: 1. Functional deficits which require 3+ hours per day of interdisciplinary therapy in a comprehensive inpatient rehab setting.  Physiatrist is providing close team supervision and 24 hour management of active medical problems listed below.  Physiatrist and rehab team continue to assess barriers to discharge/monitor patient progress toward functional and medical goals  Care Tool:  Bathing    Body parts bathed by patient: Chest,Abdomen,Front perineal area,Right upper leg,Left upper leg,Face,Left lower leg,Right arm   Body parts  bathed by helper: Right arm,Left arm,Chest,Front perineal area,Buttocks,Right upper leg,Left upper leg,Right lower leg,Left lower leg Body parts n/a: Left arm,Right lower leg,Buttocks   Bathing assist Assist Level: Moderate Assistance - Patient 50 - 74%     Upper Body Dressing/Undressing Upper body dressing   What is the patient wearing?: Pull over shirt    Upper body assist Assist Level: Moderate Assistance - Patient 50 - 74%    Lower Body Dressing/Undressing Lower body dressing      What is the patient wearing?: Pants,Incontinence brief     Lower body assist Assist for lower body dressing: Maximal Assistance - Patient 25 - 49%     Toileting Toileting    Toileting assist Assist for toileting: Dependent - Patient 0%     Transfers Chair/bed transfer  Transfers assist     Chair/bed transfer assist level: Moderate Assistance - Patient 50 - 74%     Locomotion Ambulation   Ambulation assist      Assist level: Moderate Assistance - Patient 50 - 74% Assistive device: Walker-rolling Max distance: 78ft   Walk 10 feet activity   Assist     Assist level: Moderate Assistance - Patient - 50 - 74% Assistive device: Walker-rolling,Orthosis   Walk 50 feet activity   Assist Walk 50 feet with 2 turns activity did not occur: Safety/medical concerns  Assist level: Moderate Assistance - Patient - 50 - 74% Assistive device: Walker-rolling,Orthosis    Walk 150 feet activity   Assist Walk 150 feet activity did not occur: Safety/medical concerns  Walk 10 feet on uneven surface  activity   Assist Walk 10 feet on uneven surfaces activity did not occur: Safety/medical concerns         Wheelchair     Assist Will patient use wheelchair at discharge?: Yes Type of Wheelchair: Manual    Wheelchair assist level: Maximal Assistance - Patient 25 - 49% Max wheelchair distance: 5ft    Wheelchair 50 feet with 2 turns activity    Assist         Assist Level: Total Assistance - Patient < 25%   Wheelchair 150 feet activity     Assist      Assist Level: Total Assistance - Patient < 25%   Blood pressure 120/80, pulse 64, temperature 98.3 F (36.8 C), temperature source Oral, resp. rate 16, height 5\' 6"  (1.676 m), weight 80.8 kg, SpO2 96 %.    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 -patient may  shower -ELOS/Goals: sup/minA, 18-21d  -Continue CIR 2. Antithrombotics: -DVT/anticoagulation:Lovenox -antiplatelet therapy: Aspirin 325 mg daily and Plavix 75 mg daily x3 months then aspirin alone 3. Pain Management:Tylenol prn 4. Mood:Provide emotional support -antipsychotic agents: N/A 5. Neuropsych: This patientis notcapable of making decisions on hisown behalf. 6. Skin/Wound Care:Routine skin checks 7. Fluids/Electrolytes/Nutrition:Routine in and outs with follow-up chemistries  12/19- will change IVFs to nocturnal 8. Hypertension. Lisinopril 20 mg daily. Monitor with increased mobility Vitals:   05/28/20 0513 05/28/20 1209  BP: 122/71 120/80  Pulse: 62 64  Resp: 16 16  Temp: 98 F (36.7 C) 98.3 F (36.8 C)  SpO2: 98% 96%  elevated 12/17 but was previously very well controlled, continue to monitor, monitor orthostatsis on flomax  12/18- BP well controlled in last 24 hours- con't regimen  9. Dysphagia. Dysphagia #1 pudding thick liquid. Monitor hydration. Speech therapy follow-up 10. New findings diabetes mellitus. Hemoglobin A1c 8.7. Lantus insulin 10 units nightly. Diabetic teaching CBG (last 3)  Recent Labs    05/27/20 2101 05/28/20 0605 05/28/20 1134  GLUCAP 127* 112* 81  increase lantus to 15U- cont current dose   12/19- BGs 81-127- con't regimen 11. Hyperlipidemia. Lipitor 12. Ascending aortic  aneurysm with dilation 4.1 cm. Recommendations annual imaging followed by CTA or MRA. 13.  Pre renal azotemia, IVF at noc repeat BMET in am  14.  Leukocytosis , afeb check UA C and S - 20K enterococcus now on macrobid 15.  Urinary retention appreciate uro assist- complete macrobid in 3 d, start flomax, will do voiding trial next week  16. Bradycardic to 50s: normal HR on 12/17: continue to monitor 17. Constipation: schedule senna-docusate HS  12/18- LBM this AM- con't regimen  12/19- LBM yesterday.   LOS: 13 days A FACE TO FACE EVALUATION WAS PERFORMED  Aaron Weiss 05/28/2020, 12:39 PM

## 2020-05-29 ENCOUNTER — Inpatient Hospital Stay (HOSPITAL_COMMUNITY): Payer: BC Managed Care – PPO | Admitting: Physical Therapy

## 2020-05-29 ENCOUNTER — Inpatient Hospital Stay (HOSPITAL_COMMUNITY): Payer: BC Managed Care – PPO

## 2020-05-29 ENCOUNTER — Inpatient Hospital Stay (HOSPITAL_COMMUNITY): Payer: BC Managed Care – PPO | Admitting: Occupational Therapy

## 2020-05-29 LAB — GLUCOSE, CAPILLARY
Glucose-Capillary: 85 mg/dL (ref 70–99)
Glucose-Capillary: 97 mg/dL (ref 70–99)
Glucose-Capillary: 111 mg/dL — ABNORMAL HIGH (ref 70–99)
Glucose-Capillary: 133 mg/dL — ABNORMAL HIGH (ref 70–99)

## 2020-05-29 LAB — CREATININE, SERUM
Creatinine, Ser: 0.8 mg/dL (ref 0.61–1.24)
GFR, Estimated: >60 mL/min (ref >60)

## 2020-05-29 MED ORDER — INSULIN GLARGINE 100 UNIT/ML ~~LOC~~ SOLN
14.0000 [IU] | Freq: Every day | SUBCUTANEOUS | Status: DC
  Administered 2020-05-29 – 2020-06-08 (×11): 14 [IU] via SUBCUTANEOUS
  Filled 2020-05-29 (×12): qty 0.14

## 2020-05-29 NOTE — Progress Notes (Signed)
Physical Therapy Session Note  Patient Details  Name: Aaron Weiss MRN: [id number] Date of Birth: 05-14-1966  Today's Date: 05/29/2020 PT Individual Time: 1305-1400 and 1500-1530 PT Individual Time Calculation (min): 55 min and 30 min   Short Term Goals: Week 2:  PT Short Term Goal 1 (Week 2): pt will ambulate 66f with min assist and LRAD PT Short Term Goal 2 (Week 2): Pt will perform stand pivot transfers with mod assist PT Short Term Goal 3 (Week 2): Pt will perform swing pivot transfers with min assist or better in both directions PT Short Term Goal 4 (Week 2): Pt will demonstrate increased activation of his R glutes/quads and demonstrate carryover into gait training  Skilled Therapeutic Interventions/Progress Updates: Tx1: Pt presented in bed agreeable to therapy. Pt denies pain during session. Performed supine to sit with modA and performed squat pivot to L to w/c with minA. Pt transported to rehab gym and performed squat pivot to R to high/low mat with minA. Pt participated in STS with RW with PTA facilitating hips in standing to wt shift to increase wt bearing through RLE. Pt performed reaching activity placing clothespins onto basketball net with emphasis on reaching to R. Pt required max multimodal cues to wt bear through RLE and to minimize R knee flexion. After seated break pt performed stand without AD but with PTA blocking R knee and had pt reach to remove clothespins. Pt required modA for activity. Participated in pre-gait activity in parallel bars with pt standing and performing TKE with RLE 2 x 5. Participated in gait training x 249fwith modA and verbal cues to maintain TKE in stance phase as well as verbal cues to increase abduction with RLE with PTA stating "step to wheel" with improvement in step length and BOS. Pt then propelled via hemi-technique >[id number] to room with supervision overall and verbal cues for maintaining straight trajectory and avoiding objects on R. Pt  agreeable to remain in w/c at end of session and left with belt alarm on, call bell within reach and needs met.   Tx2: Pt presented in w/c agreeable to therapy. Pt denies pain at start of session. Pt transported to rehab gym for energy conservation as session to focus on stand pivot transfers. Pt performed stand with RW and minA then PTA noted pt incontinent of bowels. Pt returned to sitting and transported back to room. Pt performed STS with minA and RW and with PTA guarding pt and [name], NT performing peri-care. PTA provided assist for clothing management and donning new brief. Pt then performed squat pivot with minA to return to bed and performed sit to supine transfer with bed rails with minA. Pt repositioned to comfort and left with bed alarm on, call bell within reach and needs met.      Therapy Documentation Precautions:  Precautions Precautions: Fall Precaution Comments: aphasia Restrictions Weight Bearing Restrictions: No General:   Vital Signs: Therapy Vitals Temp: 98.4 F (36.9 C) Pulse Rate: 93 Resp: 18 BP: 127/85 Patient Position (if appropriate): Sitting Oxygen Therapy SpO2: 98 % O2 Device: Room Air Therapy/Group: Individual Therapy  Jacky Hartung  Lacosta Hargan, PTA  05/29/2020, 3:58 PM

## 2020-05-29 NOTE — Progress Notes (Signed)
Occupational Therapy Session Note  Patient Details  Name: Aaron Weiss MRN: [id number] Date of Birth: [birth year]  Today's Date: 05/29/2020 OT Individual Time: [phone] OT Individual Time Calculation (min): 63 min   Skilled Therapeutic Interventions/Progress Updates:    Pt greeted in bed with no c/o pain, agreeable to start session by eating his breakfast. We discussed his swallowing precautions beforehand and pt did well given min cuing and full supervision, no s/s aspiration while eating his meal EOB with honey thickened beverages. He ate while sitting EOB (CGA for supine<sit using the bedrail!). Saebo stim-one applied to anterior/posterior deltoids with the Rt arm placed in a weightbearing position with manual facilitation for flat palm. Supervision for sitting balance. He declined toileting but was agreeable to complete bathing/dressing tasks during tx sit<stand from EOB. Therapist had more safe facilitation for sit<stands without AD than with RW due to poor Rt knee control. Mod A for sit<stands and for dynamic standing balance while pt assisted with LB self care on the Lt side. He required HOH to use the Rt hand functionally due to flaccidity. Max A to functionally reach feet due to decreased dynamic sitting balance, pt losing balance posterioraly when attempting figure 4 position on the Lt side. Worked on joint protection when assisting therapist move his Rt UE/LE. Mod A for lateral scoots towards HOB and for transition to supine. Pt able to bridge for neutral hip alignment given assistance for Rt LE placement. He remained in bed at close of session, left with all needs within reach and bed alarm set, hemiplegic side protected.   Pt tolerated e-stim for 21 minutes during eating, UB bathing (dry wash cloth protecting e-stim site), and UB dressing today to muscle groups stated above. Skin intact once pads were removed. E-stim parameters are listed below.    Saebo Stim One 330 pulse width 35  Hz pulse rate On 8 sec/ off 8 sec Ramp up/ down 2 sec Symmetrical Biphasic wave form  Max intensity at 500 Ohm load   Therapy Documentation Precautions:  Precautions Precautions: Fall Precaution Comments: aphasia Restrictions Weight Bearing Restrictions: No Vital Signs: Therapy Vitals Pulse Rate: 80 BP: (!) 149/96 ADL: ADL Eating: Moderate assistance Where Assessed-Eating: Wheelchair Grooming: Minimal assistance Where Assessed-Grooming: Wheelchair Upper Body Bathing: Minimal assistance Where Assessed-Upper Body Bathing: Wheelchair Lower Body Bathing: Moderate assistance Where Assessed-Lower Body Bathing: Chair Upper Body Dressing: Moderate assistance Where Assessed-Upper Body Dressing: Chair Lower Body Dressing: Maximal assistance Where Assessed-Lower Body Dressing: Chair Toileting: Dependent Where Assessed-Toileting: Bedside Commode Toilet Transfer: Moderate assistance Toilet Transfer Method: Squat pivot Toilet Transfer Equipment: Drop arm bedside commode ADL Comments: sit pivot transfer bed to/from w/c max A     Therapy/Group: Individual Therapy  Diandra Cimini A Mikaeel Petrow 05/29/2020, 12:21 PM

## 2020-05-29 NOTE — Progress Notes (Signed)
[place] PHYSICAL MEDICINE & REHABILITATION PROGRESS NOTE   Subjective/Complaints: No complaints this morning. Denies pain, constipation, insomnia.   ROS: Denies pain  Objective:   No results found. No results for input(s): WBC, HGB, HCT, PLT in the last 72 hours. Recent Labs    05/29/20 0655  CREATININE 0.80    Intake/Output Summary (Last 24 hours) at 05/29/2020 1122 Last data filed at 05/29/2020 0900 Gross per 24 hour  Intake 901.43 ml  Output 1900 ml  Net -998.57 ml        Physical Exam: Vital Signs Blood pressure (!) 149/96, pulse 80, temperature 98.2 F (36.8 C), temperature source Oral, resp. rate 16, height 5\' 6"  (1.676 m), weight 80.8 kg, SpO2 98 %. Gen: no distress, normal appearing HEENT: oral mucosa pink and moist, NCAT Cardio: Reg rate Chest: normal effort, normal rate of breathing Abd: soft, non-distended Ext: no edema Neurologic: Cranial nerves II through XII intact, motor strength is 5/5 in left and 0/5 right  deltoid, bicep, tricep, grip, hip flexor, knee extensors, ankle dorsiflexor and plantar flexor Sensory exam cannot assess due to aphasia  Cerebellar exam cannot perform on Right due to we Tone without spasticity akness Musculoskeletal: Full range of motion in all 4 extremities. No joint swelling   Assessment/Plan: 1. Functional deficits which require 3+ hours per day of interdisciplinary therapy in a comprehensive inpatient rehab setting.  Physiatrist is providing close team supervision and 24 hour management of active medical problems listed below.  Physiatrist and rehab team continue to assess barriers to discharge/monitor patient progress toward functional and medical goals  Care Tool:  Bathing    Body parts bathed by patient: Chest,Abdomen,Front perineal area,Right upper leg,Left upper leg,Face   Body parts bathed by helper: Left arm,Right arm,Buttocks,Right lower leg,Left lower leg Body parts n/a: Left arm,Right lower  leg,Buttocks   Bathing assist Assist Level: Moderate Assistance - Patient 50 - 74%     Upper Body Dressing/Undressing Upper body dressing   What is the patient wearing?: Pull over shirt    Upper body assist Assist Level: Moderate Assistance - Patient 50 - 74%    Lower Body Dressing/Undressing Lower body dressing      What is the patient wearing?: Pants,Incontinence brief     Lower body assist Assist for lower body dressing: Maximal Assistance - Patient 25 - 49%     Toileting Toileting    Toileting assist Assist for toileting: Dependent - Patient 0%     Transfers Chair/bed transfer  Transfers assist     Chair/bed transfer assist level: Moderate Assistance - Patient 50 - 74%     Locomotion Ambulation   Ambulation assist      Assist level: Moderate Assistance - Patient 50 - 74% Assistive device: Walker-rolling Max distance: 21ft   Walk 10 feet activity   Assist     Assist level: Moderate Assistance - Patient - 50 - 74% Assistive device: Walker-rolling,Orthosis   Walk 50 feet activity   Assist Walk 50 feet with 2 turns activity did not occur: Safety/medical concerns  Assist level: Moderate Assistance - Patient - 50 - 74% Assistive device: Walker-rolling,Orthosis    Walk 150 feet activity   Assist Walk 150 feet activity did not occur: Safety/medical concerns         Walk 10 feet on uneven surface  activity   Assist Walk 10 feet on uneven surfaces activity did not occur: Safety/medical concerns         Wheelchair  Assist Will patient use wheelchair at discharge?: Yes Type of Wheelchair: Manual    Wheelchair assist level: Maximal Assistance - Patient 25 - 49% Max wheelchair distance: 87ft    Wheelchair 50 feet with 2 turns activity    Assist        Assist Level: Total Assistance - Patient < 25%   Wheelchair 150 feet activity     Assist      Assist Level: Total Assistance - Patient < 25%   Blood  pressure (!) 149/96, pulse 80, temperature 98.2 F (36.8 C), temperature source Oral, resp. rate 16, height 5\' 6"  (1.676 m), weight 80.8 kg, SpO2 98 %.    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 -patient may  shower -ELOS/Goals: sup/minA, 18-21d  -Continue CIR 2. Antithrombotics: -DVT/anticoagulation:Lovenox -antiplatelet therapy: Aspirin 325 mg daily and Plavix 75 mg daily x3 months then aspirin alone 3. Pain Management:Continue Tylenol prn 4. Mood:Provide emotional support -antipsychotic agents: N/A 5. Neuropsych: This patientis notcapable of making decisions on hisown behalf. 6. Skin/Wound Care:Routine skin checks 7. Fluids/Electrolytes/Nutrition:Routine in and outs with follow-up chemistries  12/19- will change IVFs to nocturnal 8. Hypertension. Lisinopril 20 mg daily. Monitor with increased mobility Vitals:   05/29/20 0509 05/29/20 0921  BP: (!) 141/85 (!) 149/96  Pulse: 75 80  Resp: 16   Temp: 98.2 F (36.8 C)   SpO2: 98%   12/20: labile- continue Lisinopril 20mg  daily 9. Dysphagia. Dysphagia #1 pudding thick liquid. Monitor hydration. Speech therapy follow-up 10. New findings diabetes mellitus. Hemoglobin A1c 8.7. Lantus insulin 10 units nightly. Diabetic teaching CBG (last 3)  Recent Labs    05/28/20 1704 05/28/20 2113 05/29/20 0611  GLUCAP 93 93 85  12/20: excellent control 80s-90s: decreae lantus to 14U 11. Hyperlipidemia. Lipitor 12. Ascending aortic aneurysm with dilation 4.1 cm. Recommendations annual imaging followed by CTA or MRA. 13.  Pre renal azotemia, IVF at noc repeat BMET in am  14.  Leukocytosis , afeb check UA C and S - 20K enterococcus now on macrobid 15.  Urinary retention appreciate uro assist- complete macrobid in 3 d, start flomax, will do  voiding trial next week  16. Bradycardic to 50s: normal HR on 12/17: continue to monitor 17. Constipation: schedule senna-docusate HS  12/18- LBM this AM- con't regimen  12/19- LBM yesterday.   LOS: 14 days A FACE TO FACE EVALUATION WAS PERFORMED  Aaron Weiss P Guiseppe Flanagan 05/29/2020, 11:22 AM

## 2020-05-29 NOTE — Progress Notes (Signed)
Patient ID: Aaron Weiss, male   DOB: [birth year], 54 y.o.   MRN: [id number]   SW received phone call from patients sister Germaine Pomfret) reporting patient cannot return home. Inquiring about SNF. SW informed sister the barriers of this due to insurance. Sister reports she will speak with patient son and get back with me.   [place], [place] [phone]

## 2020-05-29 NOTE — Progress Notes (Signed)
Speech Language Pathology Daily Session Note  Patient Details  Name: Aaron Weiss MRN: [id number] Date of Birth: 02-01-1966  Today's Date: 05/29/2020 SLP Individual Time: 9924-2683 SLP Individual Time Calculation (min): 44 min  Short Term Goals: Week 2: SLP Short Term Goal 1 (Week 2): Pt will consume trials of Dys 2 textures across at least 2 sessions with efficient mastication and oral clearance with no more than Min A cues for use of compensatory swallow strategies prior to advancement. SLP Short Term Goal 2 (Week 2): Pt will consume therapeutic trials of ice/thin H2O after oral care with minimal overt s/sx aspiration X3 prior to repeat instrumental testing of swallow function. SLP Short Term Goal 3 (Week 2): Pt will match words to objects with 80% accuracy with Mod A multimodal cues. SLP Short Term Goal 4 (Week 2): Pt will name common functional objects with 75% accuracy provided Mod A cues. SLP Short Term Goal 5 (Week 2): Pt will use strategies to increase his speech intelligibility with Min A cues. SLP Short Term Goal 6 (Week 2): Pt will demonstrate awareness of verbal errors in 75% of opportunities provided Mod A cues/feedback.  Skilled Therapeutic Interventions:Skilled ST services focused on swallow and communication skills. Pt completed oral care, suction toothbrush, with set up assist. SLP facilitated PO consumption of thin liquid trials, in which pt consumed x10 ice chips (noting x1 delayed cough), x5 TSP and X3 cup sips (x1 delayed cough with larger sip.) Pt was able to identify 2 out 6 pictures on communication board increasing to 4 out 6 with mod A semantic and gestural cues (ex: imitating pain face.) Pt was able to name 8 out 10 objects from [facility] toolkit with max CV phonetic cues and 1 out 10 opportunities with just sentence completion cue. Pt was also able to name verb demonstrated in picture cards in 3 out 10 opportunities with CV phonetic cues and 1 out 10 opportunities with  sentence completion cues. Pt appeared to demonstrated moderate increase in verbal error awareness as the session continue. Pt was left in room with call bell within reach and bed alarm set. SLP recommends to continue skilled services.     Pain Pain Assessment Pain Scale: 0-10 Pain Score: 0-No pain  Therapy/Group: Individual Therapy  Vada Yellen  [facility] 05/29/2020, 11:47 AM

## 2020-05-29 NOTE — Progress Notes (Signed)
Occupational Therapy Session Note  Patient Details  Name: Aaron Weiss MRN: [id number] Date of Birth: 1965-11-03  Today's Date: 05/29/2020 OT Individual Time: 1030-1100 OT Individual Time Calculation (min): 30 min    Short Term Goals: Week 2:  OT Short Term Goal 1 (Week 2): Pt will maintain dynamic sitting balance with S overall OT Short Term Goal 2 (Week 2): Pt will complete UB bathing with MIN A OT Short Term Goal 3 (Week 2): Pt will locate 2/3 items on L side of sink to demo improved L inattention OT Short Term Goal 4 (Week 2): Pt will sit to stnad wiht MOD A overall in prep for toileting/LB dressing  Skilled Therapeutic Interventions/Progress Updates:    Patient in bed, alert and denies pain.  Supine to sitting edge of bed with min A.  Good sitting balance.  Sit pivot transfers to/from bed, w/c and mat table min A with cues for rate and safe positioning.   Completed right UE inhibitory and stretching activities, NMRE,  trunk control and posture, scapular mobility and weight bearing - patient with improved carryover of upright posture and scapular positioning.  Returned to bed at close of session, bed alarm set and call bell in hand.    Therapy Documentation Precautions:  Precautions Precautions: Fall Precaution Comments: aphasia Restrictions Weight Bearing Restrictions: No   Therapy/Group: Individual Therapy  Barrie Lyme 05/29/2020, 7:51 AM

## 2020-05-30 ENCOUNTER — Inpatient Hospital Stay (HOSPITAL_COMMUNITY): Payer: BC Managed Care – PPO

## 2020-05-30 ENCOUNTER — Inpatient Hospital Stay (HOSPITAL_COMMUNITY): Payer: BC Managed Care – PPO | Admitting: Speech Pathology

## 2020-05-30 ENCOUNTER — Inpatient Hospital Stay (HOSPITAL_COMMUNITY): Payer: BC Managed Care – PPO | Admitting: Occupational Therapy

## 2020-05-30 LAB — GLUCOSE, CAPILLARY
Glucose-Capillary: 108 mg/dL — ABNORMAL HIGH (ref 70–99)
Glucose-Capillary: 123 mg/dL — ABNORMAL HIGH (ref 70–99)
Glucose-Capillary: 105 mg/dL — ABNORMAL HIGH (ref 70–99)
Glucose-Capillary: 110 mg/dL — ABNORMAL HIGH (ref 70–99)

## 2020-05-30 NOTE — Progress Notes (Signed)
Speech Language Pathology Weekly Progress and Session Note  Patient Details  Name: Aaron Weiss MRN: [id number] Date of Birth: 1965-07-05  Beginning of progress report period: May 23, 2020 End of progress report period: May 30, 2020  Today's Date: 05/30/2020 SLP Individual Time: 0092-3300 SLP Individual Time Calculation (min): 55 min  Short Term Goals: Week 2: SLP Short Term Goal 1 (Week 2): Pt will consume trials of Dys 2 textures across at least 2 sessions with efficient mastication and oral clearance with no more than Min A cues for use of compensatory swallow strategies prior to advancement. SLP Short Term Goal 1 - Progress (Week 2): Met SLP Short Term Goal 2 (Week 2): Pt will consume therapeutic trials of ice/thin H2O after oral care with minimal overt s/sx aspiration X3 prior to repeat instrumental testing of swallow function. SLP Short Term Goal 2 - Progress (Week 2): Progressing toward goal SLP Short Term Goal 3 (Week 2): Pt will match words to objects with 80% accuracy with Mod A multimodal cues. SLP Short Term Goal 3 - Progress (Week 2): Met SLP Short Term Goal 4 (Week 2): Pt will name common functional objects with 75% accuracy provided Mod A cues. SLP Short Term Goal 4 - Progress (Week 2): Met SLP Short Term Goal 5 (Week 2): Pt will use strategies to increase his speech intelligibility with Min A cues. SLP Short Term Goal 5 - Progress (Week 2): Progressing toward goal SLP Short Term Goal 6 (Week 2): Pt will demonstrate awareness of verbal errors in 75% of opportunities provided Mod A cues/feedback. SLP Short Term Goal 6 - Progress (Week 2): Met    New Short Term Goals: Week 3: SLP Short Term Goal 1 (Week 3): STG=LTG due to remaining length of stay  Weekly Progress Updates: Pt has made functional gains and met 4 out of 6 short term goals this reporting period. Pt is currently Mod assist for functional communication due to mixed expressive/receptive aphasia.  Expressive deficits remain more severe than receptive. He exhibits mild perseveration on words, semantic and phonemic paraphasias, and decreased awareness of verbal and functional errors at times. He also exhibits moderate dysarthria marked by articulatory imprecision, which reduces his speech intelligibility to ~85%/ Pt is consuming a dysphagia 2 texture diet (minced/ground) with honey thick liquids. His independence with use of compensatory swallow strategies for oral clearance or right buccal pocketing has been steadily improving over the last week. Pt and family education is ongoing. Pt would continue to benefit from skilled ST while inpatient in order to maximize functional independence and reduce burden of care prior to discharge. Anticipate that pt will need 24/7 supervision at discharge in addition to [place] follow up at next level of care.     Intensity: Minumum of 1-2 x/day, 30 to 90 minutes Frequency: 3 to 5 out of 7 days Duration/Length of Stay: 06/09/20 Treatment/Interventions: Cueing hierarchy;Functional tasks;Cognitive remediation/compensation;Speech/Language facilitation;Internal/external aids;Multimodal communication approach;Patient/family education;Therapeutic Activities;Dysphagia/aspiration precaution training   Daily Session  Skilled Therapeutic Interventions: Pt was seen for skilled ST targeting dysphagia and communication goals. NT and PT reported pt has not been eating much, therefore, SLP assisted pt in communicating his food preferences. With Mod A question cues, he communicated it is the food choices as opposed to texture that he dislikes. Provided choices, pt able to repeat and answer yes/no questions with Min A cues to make a list of food preferences as well as foods he dislikes. SLP and pt also called [facility] nutrition together to place  his next 3 meals; with multiple choice cues, pt able to select his meal choices. SLP informed nursing of list left in room and suggested staff  assist pt in ordering meals in hopes this may increase his PO intake. Pt also named common items in the room 8/10 items with mostly sentence completion and 2 additional phonemic cues. Pt with ~50% accuracy in use of his picture/word communication board with Mod A semantic cues. He required Min A for awareness of verbal errors today.  SLP further facilitated session with dysphagia 3 texture snack to assess potential for advancement. Donned pt's dentures, although they are ill fitting and actually made mastication even more prolonged/laborious than without them. Min A right buccal pocketing noted, but pt able to clear with liquid washes and lingual sweeps with Min A verbal cues. No overt s/sx aspiration noted with honey thick juice or Dys 3. After oral care, pt accepted 2/2 ice chips and 1/1 cup sip thin H2O without overt s/sx aspiration, but reduced labial seal/min anterior loss when using cup noted. Pt exhibited 1 immediate strong cough response out of 8 tsp trials thin H2O. Although encouraged to continue, pt politely declined further trials. Pt left sitting in wheelchair with alarm set and needs within reach. Continue per current plan of care.     Pain Pain Assessment Pain Scale: 0-10 Pain Score: 0-No pain  Therapy/Group: Individual Therapy  Arbutus Leas 05/30/2020, 12:27 PM

## 2020-05-30 NOTE — Progress Notes (Signed)
Physical Therapy Session Note  Patient Details  Name: Aaron Weiss MRN: [id number] Date of Birth: 1965-08-03  Today's Date: 05/30/2020 PT Individual Time: 4503-8882 1100-1120 PT Individual Time Calculation (min): 10 min and 20 min Short Term Goals: Week 1:  PT Short Term Goal 1 (Week 1): Pt will perform wc to/from bed w/mod assist to R, max assist of 1 to L PT Short Term Goal 1 - Progress (Week 1): Met PT Short Term Goal 2 (Week 1): sit to stand w/LRAD and mod assist PT Short Term Goal 2 - Progress (Week 1): Met PT Short Term Goal 3 (Week 1): gait x 93f w/LRAD and mod assist of 2, appropriate bracing/wrapping of R ankle PT Short Term Goal 3 - Progress (Week 1): Met PT Short Term Goal 4 (Week 1): pt will maintain sitting balance w/cross body reaching tasks w/min assist PT Short Term Goal 4 - Progress (Week 1): Met Week 2:  PT Short Term Goal 1 (Week 2): pt will ambulate 26fwith min assist and LRAD PT Short Term Goal 2 (Week 2): Pt will perform stand pivot transfers with mod assist PT Short Term Goal 3 (Week 2): Pt will perform swing pivot transfers with min assist or better in both directions PT Short Term Goal 4 (Week 2): Pt will demonstrate increased activation of his R glutes/quads and demonstrate carryover into gait training Week 3:     Skilled Therapeutic Interventions/Progress Updates:    PAIN denies pain Pt initially oob in wc, agreeable to session. Transported to hall.  Sit to stand from wc w/max cues for safety due to impulsivity, moves without regard for RLE positioning,  min assist for safety.  Gait 2092f/RW, cues for safe pace due to impulsivity, advances RLE w/poorly controlled advancement/positions close to midline at initial contace, decreased quad activation/requires multimodal cues to facilitate R knee stability/extension thrust occurs vs buckling tendency, cues to decrease speed/focus on R limb activation prior to stepping w/L, hip instability w/stance/poor glut  activation.  Overall mod assist w/gait w/rw/hand orthosis.  Pt transported to gym.  Squat pivot to L w/max cues for pacing movement/attention to set up w/R limb.  Pt noted to be incontent of bowel and required return to room for nursing care.  Stand pivot mat to wc w/max cues, mod assist. Pt left oob in wc w/alarm belt set and needs in reach per nursing tech request for this position for brief change.  Returned 11:50 to complete session.   Pt supine to sit w/cues to attend to R limbs, cga, cues for safety Pt Sit to stand w/min assist and worked on tapping disks on floor in 1/2 floor clock w/RLE/performed for RLE strength/control/attention.  Pt returns supine w/cues to attend to Rexts only/several efforts to fully lift RLE into bed.  Supine therex:  1/2 clamshell RLE x 10 Bridge x 10 Pt left supine w/rails up x 4, alarm set, bed in lowest position, and needs in reach.  Therapy Documentation Precautions:  Precautions Precautions: Fall Precaution Comments: aphasia Restrictions Weight Bearing Restrictions: No       Therapy/Group: Individual Therapy  BarCallie FieldingT [place]/21/2021, 12:35 PM

## 2020-05-30 NOTE — Progress Notes (Signed)
Occupational Therapy Session Note  Patient Details  Name: Aaron Weiss MRN: [id number] Date of Birth: 1965/10/05  Today's Date: 05/30/2020 OT Individual Time: 1422-1500 OT Individual Time Calculation (min): 38 min    Short Term Goals: Week 2:  OT Short Term Goal 1 (Week 2): Pt will maintain dynamic sitting balance with S overall OT Short Term Goal 2 (Week 2): Pt will complete UB bathing with MIN A OT Short Term Goal 3 (Week 2): Pt will locate 2/3 items on L side of sink to demo improved L inattention OT Short Term Goal 4 (Week 2): Pt will sit to stnad wiht MOD A overall in prep for toileting/LB dressing  Skilled Therapeutic Interventions/Progress Updates:    Treatment session with focus on RUE NMR and sitting balance.  Pt received supine in bed agreeable to therapy session but declining treatment out of room.  Pt completed bed mobility with CGA to come to sitting EOB.  Engaged in PROM to RUE with focus on shoulder and elbow mobility.  Therapist encouraged pt to attempt any AROM with no active movement.  Therapist applied Sa[place]o stim to facilitate shoulder shrug, as noted mild sublux.  Engaged in [place] while saebo stim applied to further facilitate shoulder approximation.  Pt reports discomfort in Rt shoulder, however when given increased support no pain.  Applied saebo e-stim to forearm to facilitate finger extension, while proving PROM for grip during off cycle.  Pt tolerated stim with no adverse reactions and skin intact.  Utilized UE Ranger to attempt to facilitate any activation.  Pt returned to supine with min assist and left semi-reclined in bed with all needs in reach.   Sa[place]o Stim One 330 pulse width 35 Hz pulse rate On 8 sec/ off 8 sec Ramp up/ down 2 sec Symmetrical Biphasic wave form  Max intensity at 500 Ohm load   Therapy Documentation Precautions:  Precautions Precautions: Fall Precaution Comments: aphasia Restrictions Weight Bearing Restrictions:  No General:   Vital Signs: Therapy Vitals Temp: 98.3 F (36.8 C) Pulse Rate: 71 Resp: 17 BP: 110/72 Patient Position (if appropriate): Lying Oxygen Therapy SpO2: 96 % O2 Device: Room Air Pain:  Pt communicating pain in Rt shoulder with AAROM.   Therapy/Group: Individual Therapy  Rosalio Loud 05/30/2020, 3:21 PM

## 2020-05-30 NOTE — Progress Notes (Addendum)
[facility] PHYSICAL MEDICINE & REHABILITATION PROGRESS NOTE   Subjective/Complaints: No complaints this morning. Somnolent, but easily arousable.  ROS: Denies pain  Objective:   No results found. No results for input(s): WBC, HGB, HCT, PLT in the last 72 hours. Recent Labs    05/29/20 0655  CREATININE 0.80    Intake/Output Summary (Last 24 hours) at 05/30/2020 1515 Last data filed at 05/30/2020 1231 Gross per 24 hour  Intake 240 ml  Output 1300 ml  Net -1060 ml   Physical Exam: Vital Signs Blood pressure 110/72, pulse 71, temperature 98.3 F (36.8 C), resp. rate 17, height 5\' 6"  (1.676 m), weight 80.8 kg, SpO2 96 %. Gen: no distress, normal appearing HEENT: oral mucosa pink and moist, NCAT Cardio: Reg rate Chest: normal effort, normal rate of breathing Abd: soft, non-distended Ext: no edema Skin: intact Neurologic: Cranial nerves II through XII intact, motor strength is 5/5 in left and 0/5 right  deltoid, bicep, tricep, grip, hip flexor, knee extensors, ankle dorsiflexor and plantar flexor Sensory exam cannot assess due to aphasia  Cerebellar exam cannot perform on Right due to we Tone without spasticity akness Musculoskeletal: Full range of motion in all 4 extremities. No joint swelling   Assessment/Plan: 1. Functional deficits which require 3+ hours per day of interdisciplinary therapy in a comprehensive inpatient rehab setting.  Physiatrist is providing close team supervision and 24 hour management of active medical problems listed below.  Physiatrist and rehab team continue to assess barriers to discharge/monitor patient progress toward functional and medical goals  Care Tool:  Bathing    Body parts bathed by patient: Chest,Abdomen,Front perineal area,Right upper leg,Left upper leg,Face   Body parts bathed by helper: Left arm,Right arm,Buttocks,Right lower leg,Left lower leg Body parts n/a: Left arm,Right lower leg,Buttocks   Bathing assist Assist  Level: Moderate Assistance - Patient 50 - 74%     Upper Body Dressing/Undressing Upper body dressing   What is the patient wearing?: Pull over shirt    Upper body assist Assist Level: Moderate Assistance - Patient 50 - 74%    Lower Body Dressing/Undressing Lower body dressing      What is the patient wearing?: Pants,Incontinence brief     Lower body assist Assist for lower body dressing: Maximal Assistance - Patient 25 - 49%     Toileting Toileting    Toileting assist Assist for toileting: Dependent - Patient 0%     Transfers Chair/bed transfer  Transfers assist     Chair/bed transfer assist level: Moderate Assistance - Patient 50 - 74%     Locomotion Ambulation   Ambulation assist      Assist level: Moderate Assistance - Patient 50 - 74% Assistive device: Walker-rolling Max distance: 20   Walk 10 feet activity   Assist     Assist level: Moderate Assistance - Patient - 50 - 74% Assistive device: Walker-rolling,Orthosis   Walk 50 feet activity   Assist Walk 50 feet with 2 turns activity did not occur: Safety/medical concerns  Assist level: Moderate Assistance - Patient - 50 - 74% Assistive device: Walker-rolling,Orthosis    Walk 150 feet activity   Assist Walk 150 feet activity did not occur: Safety/medical concerns         Walk 10 feet on uneven surface  activity   Assist Walk 10 feet on uneven surfaces activity did not occur: Safety/medical concerns         Wheelchair     Assist Will patient use wheelchair at discharge?: Yes  Type of Wheelchair: Manual    Wheelchair assist level: Maximal Assistance - Patient 25 - 49% Max wheelchair distance: 12ft    Wheelchair 50 feet with 2 turns activity    Assist        Assist Level: Total Assistance - Patient < 25%   Wheelchair 150 feet activity     Assist      Assist Level: Total Assistance - Patient < 25%   Blood pressure 110/72, pulse 71, temperature 98.3 F  (36.8 C), resp. rate 17, height 5\' 6"  (1.676 m), weight 80.8 kg, SpO2 96 %.    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 -patient may  shower -ELOS/Goals: sup/minA, 18-21d  -Continue CIR 2. Antithrombotics: -DVT/anticoagulation:Lovenox -antiplatelet therapy: Aspirin 325 mg daily and Plavix 75 mg daily x3 months then aspirin alone 3. Pain Management:ContinueTylenol prn 4. Mood:Provide emotional support -antipsychotic agents: N/A 5. Neuropsych: This patientis notcapable of making decisions on hisown behalf. 6. Skin/Wound Care:Routine skin checks 7. Fluids/Electrolytes/Nutrition:Routine in and outs with follow-up chemistries  12/19- will change IVFs to nocturnal 8. Hypertension. Lisinopril 20 mg daily. Monitor with increased mobility Vitals:   05/30/20 0428 05/30/20 1408  BP: 110/70 110/72  Pulse: 65 71  Resp: 20 17  Temp:  98.3 F (36.8 C)  SpO2: 98% 96%  12/21: BP is well controlled, continue Lisinopril 20mg  daily 9. Dysphagia. Dysphagia #1 pudding thick liquid. Monitor hydration. Speech therapy follow-up 10. New findings diabetes mellitus. Hemoglobin A1c 8.7. Lantus insulin 10 units nightly. Diabetic teaching CBG (last 3)  Recent Labs    05/29/20 2024 05/30/20 0555 05/30/20 1211  GLUCAP 133* 108* 123*  12/20: excellent control 80s-90s: decreae lantus to 14U 12/21: well controlled: continue to monitor. 11. Hyperlipidemia. Lipitor 12. Ascending aortic aneurysm with dilation 4.1 cm. Recommendations annual imaging followed by CTA or MRA. 13.  Pre renal azotemia, IVF at noc repeat BMET in am  14.  Leukocytosis , afeb check UA C and S - 20K enterococcus now on macrobid 15.  Urinary retention appreciate uro assist- complete macrobid in 3 d, start flomax, will do voiding trial  next week  16. Bradycardic to 50s: normal HR on 12/17: continue to monitor 17. Constipation: schedule senna-docusate HS  12/18- LBM this AM- con't regimen  12/19- LBM yesterday.   LOS: 15 days A FACE TO FACE EVALUATION WAS PERFORMED  1/19 P Estephan Gallardo 05/30/2020, 3:15 PM

## 2020-05-30 NOTE — Progress Notes (Signed)
Physical Therapy Session Note  Patient Details  Name: Aaron Weiss MRN: [id number] Date of Birth: 10/07/1965  Today's Date: 05/30/2020 PT Individual Time: 0803-0900 PT Individual Time Calculation (min): 57 min   Short Term Goals: Week 2:  PT Short Term Goal 1 (Week 2): pt will ambulate 1ft with min assist and LRAD PT Short Term Goal 2 (Week 2): Pt will perform stand pivot transfers with mod assist PT Short Term Goal 3 (Week 2): Pt will perform swing pivot transfers with min assist or better in both directions PT Short Term Goal 4 (Week 2): Pt will demonstrate increased activation of his R glutes/quads and demonstrate carryover into gait training  Skilled Therapeutic Interventions/Progress Updates: Ptt presented in bed completing breakfast and agreeable to therapy. Pt denies pain. Per NT pt not eating meals, encouraged pt to increase PO intake to promote healing. Pt then performed supine to sit with CGA with HOB elevated to 45degrees and use of bed rails. PTA donned TED hose total A and threaded pants. And donned AFO/shoes. Upon standing PTA noted pt's brief soiled (reminants from previous BM?). PTA changed brief and performed peri-care to ensure cleanliness. Pt required mod cues to not move LLE to maintain balance. Once completed pt performed squat pivot to w/c with minA. Pt transported to rehab gym and performed squat pivot to high/low mat. Participated in STS with LLE on 2in step with mirror feedback however pt with difficulty following commands to push with RLE and only using LLE thus lifting RLE off ground. Participated in toe taps to 2 in step with LLE for forced use of RLE and PTA blocking R knee with improved results 2 x 10. Participated in gait training 36ft and 79ft with modA and w/c follow. Pt required max multimodal cues for knee extension with pt able to achieve approx 25% of time. Required multimodal cues for increasing R step width, facilitation of wt shifting and safety with RW.  Pt transported back to room at end of session and remained in w/c with belt alarm on, half lap tray in place, and call bell within reach.     Therapy Documentation Precautions:  Precautions Precautions: Fall Precaution Comments: aphasia Restrictions Weight Bearing Restrictions: No General:   Vital Signs: Therapy Vitals Temp: 98.3 F (36.8 C) Pulse Rate: 71 Resp: 17 BP: 110/72 Patient Position (if appropriate): Lying Oxygen Therapy SpO2: 96 % O2 Device: Room Air   Therapy/Group: Individual Therapy  Jayleen Scaglione  Kermit Arnette, PTA  05/30/2020, 4:38 PM

## 2020-05-31 ENCOUNTER — Inpatient Hospital Stay (HOSPITAL_COMMUNITY): Payer: BC Managed Care – PPO | Admitting: Physical Therapy

## 2020-05-31 ENCOUNTER — Inpatient Hospital Stay (HOSPITAL_COMMUNITY): Payer: BC Managed Care – PPO | Admitting: Occupational Therapy

## 2020-05-31 ENCOUNTER — Inpatient Hospital Stay (HOSPITAL_COMMUNITY): Payer: BC Managed Care – PPO | Admitting: Speech Pathology

## 2020-05-31 LAB — GLUCOSE, CAPILLARY
Glucose-Capillary: 97 mg/dL (ref 70–99)
Glucose-Capillary: 118 mg/dL — ABNORMAL HIGH (ref 70–99)
Glucose-Capillary: 98 mg/dL (ref 70–99)
Glucose-Capillary: 143 mg/dL — ABNORMAL HIGH (ref 70–99)

## 2020-05-31 LAB — CBC
HCT: 38.3 % — ABNORMAL LOW (ref 39.0–52.0)
Hemoglobin: 13.4 g/dL (ref 13.0–17.0)
MCH: 31.5 pg (ref 26.0–34.0)
MCHC: 35 g/dL (ref 30.0–36.0)
MCV: 90.1 fL (ref 80.0–100.0)
Platelets: 271 10*3/uL (ref 150–400)
RBC: 4.25 MIL/uL (ref 4.22–5.81)
RDW: 13.4 % (ref 11.5–15.5)
WBC: 7.7 10*3/uL (ref 4.0–10.5)
nRBC: 0 % (ref 0.0–0.2)

## 2020-05-31 MED ORDER — CHLORHEXIDINE GLUCONATE CLOTH 2 % EX PADS
6.0000 | MEDICATED_PAD | Freq: Two times a day (BID) | CUTANEOUS | Status: DC
  Administered 2020-05-31 – 2020-06-09 (×17): 6 via TOPICAL

## 2020-05-31 NOTE — Patient Care Conference (Signed)
Inpatient RehabilitationTeam Conference and Plan of Care Update Date: 05/31/2020   Time: 10:08 AM    Patient Name: Aaron Weiss      Medical Record Number: [id number]  Date of Birth: 12/14/1965 Sex: Male         Room/Bed: 4W06C/4W06C-01 Payor Info: Payor: [employer]UE [employer] / Plan: BCBS COMM PPO / Product Type: *No Product type* /    Admit Date/Time:  05/15/2020  2:51 PM  Primary Diagnosis:  Cerebral infarction due to unspecified occlusion or stenosis of left middle cerebral artery [facility])  Hospital Problems: Principal Problem:   Cerebral infarction due to unspecified occlusion or stenosis of left middle cerebral artery (HCC) Active Problems:   Left middle cerebral artery stroke [facility])    Expected Discharge Date: Expected Discharge Date: 06/09/20  Team Members Present: Care Coodinator Present: Chana Bode, RN, BSN, CRRN;Christina [place], BSW Nurse Present: Margot Ables, LPN PT Present: Harless Litten, PTA OT Present: Roney Mans, OT SLP Present: Suzzette Righter, CF-SLP PPS Coordinator present : Edson Snowball, Park Breed, SLP     Current Status/Progress Goal Weekly Team Focus  Bowel/Bladder   Foley catheter Incontinent/continent of bowel LBM 12/20  regain urinary function continent of B/B  assess foley qshift assess for need toilet patient prn   Swallow/Nutrition/ Hydration   dys 2 textures, honey thick liquids, min A compensatory swallow strategies  Supervision A least restrictive diet  tolerance current diet, thin and Dys 3 trials, work toward repeat MBSS   ADL's             Mobility   minA bed mobility, minA squat pivot transfers, minA STS, modA gait, supervision w/c mobility  minA overall  balance, R NMR, gait   Communication   Mod-Max  Min-Mod  naming, functional phrases, communicating basic wants/needs, awareness and correction verbal errors, speech intelligibility, following directions   Safety/Cognition/ Behavioral Observations  Min-Mod  Min   orientation, emergent awareness   Pain   denies  free of pain  assess qshift and prn   Skin   BUE ecchymosis excoriation to back/buttock  prevent further breakdown or infection  assess qshift and prn     Discharge Planning:  Discharging home with son. 2 level home (able to stay on main level, level entry)   Team Discussion: Team notes no new skin issues, foley remains in place. Progress impaired by inattention to left lower extremity, poor balance and shuffling gait.  Patient on target to meet rehab goals: yes, currently mod assist with RW and min - mod assist for ADLs and min assist for squat pivot transfer with cues.  *See Care Plan and progress notes for long and short-term goals.   Revisions to Treatment Plan:  MBS scheduled for next week D2 honey with thin trials E-stim trials Teaching Needs: Transfers, toileting, medications, etc.  Current Barriers to Discharge: Decreased caregiver support and Home enviroment access/layout  Possible Resolutions to Barriers: Family education with son SW to confirm discharge disposition    Medical Summary Current Status: Remains severely aphasic, fluid intake improving off IV fluids  Barriers to Discharge: Medical stability   Possible Resolutions to [employer] Focus: Continue monitoring and adjusting blood pressure medications, monitor for prerenal azotemia of IV fluids   Continued Need for Acute Rehabilitation Level of Care: The patient requires daily medical management by a physician with specialized training in physical medicine and rehabilitation for the following reasons: Direction of a multidisciplinary physical rehabilitation program to maximize functional independence : Yes Medical management of  patient stability for increased activity during participation in an intensive rehabilitation regime.: Yes Analysis of laboratory values and/or radiology reports with any subsequent need for medication adjustment and/or medical  intervention. : Yes   I attest that I was present, lead the team conference, and concur with the assessment and plan of the team.   Chana Bode B 05/31/2020, 1:33 PM

## 2020-05-31 NOTE — Progress Notes (Signed)
Patient ID: Aaron Weiss, male   DOB: 1966/04/10, 54 y.o.   MRN: [id number]   SNF list provided to son. Sw informed son of the possible barriers due to insurance. He will discuss secondary options with family if unable to go SNF.  [place], [place] [phone]

## 2020-05-31 NOTE — Progress Notes (Signed)
Occupational Therapy Weekly Progress Note  Patient Details  Name: Aaron Weiss MRN: [id number] Date of Birth: 08/02/1965  Beginning of progress report period: May 24, 2020 End of progress report period: May 31, 2020  Today's Date: 05/31/2020 OT Individual Time: 3567-0141 and 1430-1500 OT Individual Time Calculation (min): 54 min and 30 min   Patient has met 4 of 4 short term goals.  Pt is making steady progress towards goals.  Pt currently requires min-mod assist squat pivot transfers.  Pt continues to demonstrate impulsivity and decreased motor planning therefore continuing to fluctuate in level of assistance for transfers.  Pt is able to complete sit > stand with min assist to allow therapist to assist with LB hygiene and dressing.  Pt is able to visually scan to Right environment to locate items, however due to global aphasia demonstrates ability to identify correct items 50% of time.  Pt has had minimal return in RUE, however note inconsistent shoulder activation in support position.    Patient continues to demonstrate the following deficits: muscle weakness, decreased cardiorespiratoy endurance, impaired timing and sequencing, abnormal tone, unbalanced muscle activation, motor apraxia, decreased coordination and decreased motor planning, decreased visual acuity, decreased visual perceptual skills and decreased visual motor skills, decreased attention to right, decreased initiation, decreased attention, decreased awareness, decreased problem solving, decreased safety awareness, decreased memory and delayed processing and decreased sitting balance, decreased standing balance, decreased postural control, hemiplegia and decreased balance strategies and therefore will continue to benefit from skilled OT intervention to enhance overall performance with BADL and Reduce care partner burden.  Patient progressing toward long term goals..  Continue plan of care.  OT Short Term Goals Week  2:  OT Short Term Goal 1 (Week 2): Pt will maintain dynamic sitting balance with S overall OT Short Term Goal 1 - Progress (Week 2): Met OT Short Term Goal 2 (Week 2): Pt will complete UB bathing with MIN A OT Short Term Goal 2 - Progress (Week 2): Met OT Short Term Goal 3 (Week 2): Pt will locate 2/3 items on L side of sink to demo improved L inattention OT Short Term Goal 3 - Progress (Week 2): Met OT Short Term Goal 4 (Week 2): Pt will sit to stnad wiht MOD A overall in prep for toileting/LB dressing OT Short Term Goal 4 - Progress (Week 2): Met Week 3:  OT Short Term Goal 1 (Week 3): Pt will complete LB bathing with min assist at sit > stand level OT Short Term Goal 2 (Week 3): Pt will complete toilet transfer with min assist OT Short Term Goal 3 (Week 3): Pt will complete LB dressing (except footwear) with min assist  Skilled Therapeutic Interventions/Progress Updates:    1) Treatment session with focus on self-care retraining, functional transfers, sit > stand, Rt attention, and RUE NMR.  Pt received supine in bed agreeable to therapy session.  Completed bed mobility with CGA to come to sitting at EOB.  Completed squat pivot transfer min assist to w/c with good sequencing of transfer.  Engaged in bathing and dressing at sit > stand level at sink with min assist for sit > stand when completing LB bathing and dressing.  Therapist providing hand over hand assist to integrate RUE into washing LUE and chest.  Pt with trace shoulder activation during bathing, but unable to replicate.  Completed UB dressing with assistance to thread RUE and then to pull shirt over trunk.  Educated on hemi-dressing technique with LB dressing, pt  requiring assistance to thread RLE.  Min assist sit > stand for LB dressing with pt able to pull pants over hips while therapist assisted pt with maintaining standing balance.  Therapist challenged visual scanning and attention to locate various items on sink side, however due to  global aphasia pt able to correctly locate 50% of items.  Pt demonstrating increased verbalizations this session, repeating simple words of "wash, rinse, dry" during bathing task.  Pt returned to bed at end of session, min assist squat pivot.  Pt positioned with RUE supported on pillow for improved positioning.  Pt stating "thank you" upon completion of session.  2) Treatment session with focus on RUE NMR and functional transfers.  Pt received supine in bed agreeable to therapy session.  Pt required mod assist for bed mobility this session, due to fatigue from PT session.  Pt demonstrating increased impulsivity this session during transfers, requiring increased multimodal cues for sequencing and safe transfers.  Pt required mod assist for transfers this session due to impulsivity.  Engaged in [place] in sitting at table with use of arm skate.  Therapist providing tactile cues to elbow while providing multimodal cues to decrease compensatory movements at pt overcompensating with trunk mobility to increase RUE movement.  Pt able to elicit shoulder extension and horizontal adduction, requiring facilitation for flexion and abduction.  Pt returned to room and completed squat pivot min assist back to bed.  Pt required assistance to lift RLE into bed.  Pt remained semi-reclined in bed with pillow under RUE for improved positioning.  Pt stating "thank you" upon completion of session.  Therapy Documentation Precautions:  Precautions Precautions: Fall Precaution Comments: aphasia Restrictions Weight Bearing Restrictions: No General:   Vital Signs: Therapy Vitals Temp: 98.1 F (36.7 C) Temp Source: Oral Pulse Rate: 71 Resp: 18 BP: 131/74 Patient Position (if appropriate): Lying Oxygen Therapy SpO2: 98 % O2 Device: Room Air Pain:  Pt with no c/o pain   Therapy/Group: Individual Therapy  Simonne Come 05/31/2020, 3:44 PM

## 2020-05-31 NOTE — Progress Notes (Signed)
Speech Language Pathology Daily Session Note  Patient Details  Name: Aaron Weiss MRN: [id number] Date of Birth: 1965/07/05  Today's Date: 05/31/2020 SLP Individual Time: 1696-7893 SLP Individual Time Calculation (min): 57 min  Short Term Goals: Week 3: SLP Short Term Goal 1 (Week 3): STG=LTG due to remaining length of stay  Skilled Therapeutic Interventions: Pt was seen for skilled ST targeting dysphagia and expressive/receptive language goals. SLP facilitated session with trials of thin H2O followed by dysphagia 3 solid snack (moist muffin) after oral care. Pt with only 1 delayed cough response (after ~30 seconds) after a self fed cup sip of thin H2O (total intake ~6oz from combination of tsp and self fed cup sip bolus delivery). His mastication of dysphagia 3 muffin was functional, although min right pocketing noted. Pt cleared pocketing with Min A verbal cues from clinician for cues of lingual sweep and liquid wash strategies (with honey thick juice). Recommend pt continue current diet, however trial tray or larger portion snack could be targeted next session to determine readiness for solid advancement. During structured language tasks, pt with 14/15 accuracy in matching line drawings (from field of 3), but 10/20 accuracy in matching letters (from field of 4). He was aware of verbal errors (when attempting to name letters), but mostly unaware of incorrect matches, requiring Mod-Max verbal and visual cues for correction. He provided an object function or description, pt able to point to the correct corresponding line drawing from a field of 4 with 80% accuracy. He also named those objects with sentence completion and Min-Mod A phonemic cues (improving). Pt left semi-reclined in bed with alarm set and needs within reach. Continue per current plan of care.        Pain Pain Assessment Pain Scale: 0-10 Pain Score: 0-No pain  Therapy/Group: Individual Therapy  [name] 05/31/2020,  7:25 AM

## 2020-05-31 NOTE — Progress Notes (Signed)
Physical Therapy Session Note  Patient Details  Name: Aaron Weiss MRN: [id number] Date of Birth: June 08, 1966  Today's Date: 05/31/2020 PT Individual Time: 1300-1400 PT Individual Time Calculation (min): 60 min   Short Term Goals: Week 2:  PT Short Term Goal 1 (Week 2): pt will ambulate 1f with min assist and LRAD PT Short Term Goal 2 (Week 2): Pt will perform stand pivot transfers with mod assist PT Short Term Goal 3 (Week 2): Pt will perform swing pivot transfers with min assist or better in both directions PT Short Term Goal 4 (Week 2): Pt will demonstrate increased activation of his R glutes/quads and demonstrate carryover into gait training  Skilled Therapeutic Interventions/Progress Updates:   Pt received supine in bed and agreeable to PT. Supine>sit transfer with min assist and cues for safety and awareness of RUE.   Transfer training including squat pivot to [facility] with min assist throughout session and moderate cues for proper LE placement to improve safety.  Sit<>stand with LLE on step force WB through the RLE; 2" step x 5 and 4"step x5 with min assist to improve weight shift to the RLE.    Standing balance with visual feedback from mirror 2 x 369fwith min assist to force WB through the RLE, no buckling noted on the RLE  Gait training with RW and WC follow 2 x [id number] mod assits overall from PT as well as moderate cues for sequencing of gait pattern, improved terminal knee extension on the R, and increased step width from PT.   WC mobility. With hemi technique through rehab unit [id number] for visual scanning to the R, with noted improvement in awareness of obstacles on this day.   Pt returned to room and performed squat pivot transfer to bed with min A. Sit>supine completed with Min A at the RLE, and left supine in bed with call bell in reach and all needs met.       Therapy Documentation Precautions:  Precautions Precautions: Fall Precaution  Comments: aphasia Restrictions Weight Bearing Restrictions: No    Vital Signs: Therapy Vitals Temp: 98.1 F (36.7 C) Temp Source: Oral Pulse Rate: 71 Resp: 18 BP: 131/74 Patient Position (if appropriate): Lying Oxygen Therapy SpO2: 98 % O2 Device: Room Air Pain: denies   Therapy/Group: Individual Therapy  AustLorie Phenix22/2021, 5:35 PM

## 2020-05-31 NOTE — Progress Notes (Signed)
PHYSICAL MEDICINE & REHABILITATION PROGRESS NOTE   Subjective/Complaints:  Patient remains aphasic.  Is able to point to objects on his sink such as deodorant, etc.  ROS: Limited by aphasia  Objective:   No results found. No results for input(s): WBC, HGB, HCT, PLT in the last 72 hours. Recent Labs    05/29/20 0655  CREATININE 0.80    Intake/Output Summary (Last 24 hours) at 05/31/2020 1008 Last data filed at 05/31/2020 0733 Gross per 24 hour  Intake 220 ml  Output 800 ml  Net -580 ml   Physical Exam: Vital Signs Blood pressure 109/67, pulse 64, temperature 98 F (36.7 C), resp. rate 16, height _0  (1.676 m), weight 80.8 kg, SpO2 97 %.  General: No acute distress Mood and affect are appropriate Heart: Regular rate and rhythm no rubs murmurs or extra sounds Lungs: Clear to auscultation, breathing unlabored, no rales or wheezes Abdomen: Positive bowel sounds, soft nontender to palpation, nondistended Extremities: No clubbing, cyanosis, or edema Skin: No evidence of breakdown, no evidence of rash  Neurologic: Cranial nerves II through XII intact, motor strength is 5/5 in left and 0/5 right  deltoid, bicep, tricep, grip, hip flexor, knee extensors, ankle dorsiflexor and plantar flexor Sensory exam cannot assess due to aphasia  Cerebellar exam cannot perform on Right due to we Tone without spasticity akness Musculoskeletal: Full range of motion in all 4 extremities. No joint swelling   Assessment/Plan: 1. Functional deficits which require 3+ hours per day of interdisciplinary therapy in a comprehensive inpatient rehab setting.  Physiatrist is providing close team supervision and 24 hour management of active medical problems listed below.  Physiatrist and rehab team continue to assess barriers to discharge/monitor patient progress toward functional and medical goals  Care Tool:  Bathing    Body parts bathed by patient: Chest,Abdomen,Front perineal  area,Right upper leg,Left upper leg,Face   Body parts bathed by helper: Left arm,Right arm,Buttocks,Right lower leg,Left lower leg Body parts n/a: Left arm,Right lower leg,Buttocks   Bathing assist Assist Level: Moderate Assistance - Patient 50 - 74%     Upper Body Dressing/Undressing Upper body dressing   What is the patient wearing?: Pull over shirt    Upper body assist Assist Level: Moderate Assistance - Patient 50 - 74%    Lower Body Dressing/Undressing Lower body dressing      What is the patient wearing?: Pants,Incontinence brief     Lower body assist Assist for lower body dressing: Maximal Assistance - Patient 25 - 49%     Toileting Toileting    Toileting assist Assist for toileting: Dependent - Patient 0%     Transfers Chair/bed transfer  Transfers assist     Chair/bed transfer assist level: Moderate Assistance - Patient 50 - 74%     Locomotion Ambulation   Ambulation assist      Assist level: Moderate Assistance - Patient 50 - 74% Assistive device: Walker-rolling Max distance: 19f   Walk 10 feet activity   Assist     Assist level: Moderate Assistance - Patient - 50 - 74% Assistive device: Walker-rolling,Orthosis   Walk 50 feet activity   Assist Walk 50 feet with 2 turns activity did not occur: Safety/medical concerns  Assist level: Moderate Assistance - Patient - 50 - 74% Assistive device: Walker-rolling,Orthosis    Walk 150 feet activity   Assist Walk 150 feet activity did not occur: Safety/medical concerns         Walk 10 feet on uneven surface  activity   Assist Walk 10 feet on uneven surfaces activity did not occur: Safety/medical concerns         Wheelchair     Assist Will patient use wheelchair at discharge?: Yes Type of Wheelchair: Manual    Wheelchair assist level: Maximal Assistance - Patient 25 - 49% Max wheelchair distance: 58f    Wheelchair 50 feet with 2 turns activity    Assist         Assist Level: Total Assistance - Patient < 25%   Wheelchair 150 feet activity     Assist      Assist Level: Total Assistance - Patient < 25%   Blood pressure 109/67, pulse 64, temperature 98 F (36.7 C), resp. rate 16, height _0  (1.676 m), weight 80.8 kg, SpO2 97 %.    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 -patient may  shower -ELOS/Goals: sup/minA,12/31 -Continue CIR, Team conference today please see physician documentation under team conference tab, met with team  to discuss problems,progress, and goals. Formulized individual treatment plan based on medical history, underlying problem and comorbidities.  2. Antithrombotics: -DVT/anticoagulation:Lovenox -antiplatelet therapy: Aspirin 325 mg daily and Plavix 75 mg daily x3 months then aspirin alone 3. Pain Management:ContinueTylenol prn 4. Mood:Provide emotional support -antipsychotic agents: N/A 5. Neuropsych: This patientis notcapable of making decisions on hisown behalf. 6. Skin/Wound Care:Routine skin checks 7. Fluids/Electrolytes/Nutrition:Routine in and outs with follow-up chemistries  12/19- will change IVFs to nocturnal 8. Hypertension. Lisinopril 20 mg daily. Monitor with increased mobility Vitals:   05/30/20 1954 05/31/20 0503  BP: 114/77 109/67  Pulse: 63 64  Resp: 16 16  Temp: 98.2 F (36.8 C) 98 F (36.7 C)  SpO2: 96% 97%  12/22: BP is well controlled, continue Lisinopril 21mdaily 9. Dysphagia. Dysphagia #2 Honey thick liquid. Monitor hydration. Speech therapy follow-up 10. New findings diabetes mellitus. Hemoglobin A1c 8.7. Lantus insulin 10 units nightly. Diabetic teaching CBG (last 3)  Recent Labs    05/30/20 1647 05/30/20 2121 05/31/20 0625  GLUCAP 105* 110* 97  12/20: excellent control  80s-90s: decreae lantus to 14U 12/21: well controlled: continue to monitor. 11. Hyperlipidemia. Lipitor 12. Ascending aortic aneurysm with dilation 4.1 cm. Recommendations annual imaging followed by CTA or MRA. 13.  Pre renal azotemia, IVF at noc, creat normal 12/20 14.  Leukocytosis ,- 20K enterococcus completed macrobid, will repeat CBC in a.m. 15.  Urinary retention appreciate uro assist-  start flomax, will do voiding trial next week  16. Bradycardic to 60s asymptomatic 17. Constipation: schedule senna-docusate HS  12/18- LBM this AM- con't regimen  12/19- LBM yesterday.   LOS: 16 days A FACE TO FACE EVALUATION WAS PERFORMED  AnCharlett Blake2/22/2021, 10:08 AM

## 2020-05-31 NOTE — Progress Notes (Signed)
Patient ID: Aaron Weiss, male   DOB: 1965-11-06, 54 y.o.   MRN: [id number]  Team Conference Report to Patient/Family  Team Conference discussion was reviewed with the patient and caregiver, including goals, any changes in plan of care and target discharge date.  Patient and caregiver express understanding and are in agreement.  The patient has a target discharge date of 06/09/20.  [name] 05/31/2020, 11:35 AM

## 2020-06-01 ENCOUNTER — Inpatient Hospital Stay (HOSPITAL_COMMUNITY): Payer: BC Managed Care – PPO | Admitting: Occupational Therapy

## 2020-06-01 ENCOUNTER — Inpatient Hospital Stay (HOSPITAL_COMMUNITY): Payer: BC Managed Care – PPO | Admitting: Speech Pathology

## 2020-06-01 ENCOUNTER — Inpatient Hospital Stay (HOSPITAL_COMMUNITY): Payer: BC Managed Care – PPO | Admitting: Physical Therapy

## 2020-06-01 LAB — GLUCOSE, CAPILLARY
Glucose-Capillary: 110 mg/dL — ABNORMAL HIGH (ref 70–99)
Glucose-Capillary: 116 mg/dL — ABNORMAL HIGH (ref 70–99)
Glucose-Capillary: 100 mg/dL — ABNORMAL HIGH (ref 70–99)
Glucose-Capillary: 111 mg/dL — ABNORMAL HIGH (ref 70–99)

## 2020-06-01 NOTE — Progress Notes (Signed)
Speech Language Pathology Daily Session Note  Patient Details  Name: Aaron Weiss MRN: [id number] Date of Birth: 1966-03-30  Today's Date: 06/01/2020 SLP Individual Time: 0730-0825 SLP Individual Time Calculation (min): 55 min  Short Term Goals: Week 3: SLP Short Term Goal 1 (Week 3): STG=LTG due to remaining length of stay  Skilled Therapeutic Interventions: Pt was seen for skilled ST targeting dysphagia and communication goals. SLP provided trial tray of advanced Dysphagia 3 (mechanical soft) textures to assess readiness for solid advancement. He consumed both Dysphagia 3 and honey thick liquid textures without overt s/sx aspiration, and full oral clearance achieved with Supervision A cues for use of compensatory strategies. Recommend pt upgrade to dysphagia 3 textures, continue honey thick liquids, meds whole in puree, full supervision. Communication interventions addressed both receptive and expressive language skills. After given a statement with information, pt able to answer a question with correct 1 word response in 15/18 instances, provided Min A phonemic cues. When provided a similar but longer statement which required a 2-3 word response to question, pt required Moderate repetition and phonemic cues to achieve 18/20 accuracy. Pt required Total A during divergent naming task to name 5 animals, but able to identify object categories in a convergent naming task with Mod A phonemic and semantic cues. Pt required Min A for awareness of verbal errors today. Pt was left laying in bed with alarm set and needs within reach. Continue per current plan of care.      Pain Pain Assessment Pain Scale: 0-10 Pain Score: 0-No pain  Therapy/Group: Individual Therapy  [name] 06/01/2020, 7:24 AM

## 2020-06-01 NOTE — Progress Notes (Signed)
[place] PHYSICAL MEDICINE & REHABILITATION PROGRESS NOTE   Subjective/Complaints:  No issues overnite  ROS: Limited by aphasia  Objective:   No results found. Recent Labs    05/31/20 1242  WBC 7.7  HGB 13.4  HCT 38.3*  PLT 271   No results for input(s): NA, K, CL, CO2, GLUCOSE, BUN, CREATININE, CALCIUM in the last 72 hours.  Intake/Output Summary (Last 24 hours) at 06/01/2020 0914 Last data filed at 05/31/2020 1832 Gross per 24 hour  Intake 195 ml  Output 350 ml  Net -155 ml   Physical Exam: Vital Signs Blood pressure 114/70, pulse (!) 58, temperature 98.4 F (36.9 C), resp. rate 20, height 5\' 6"  (1.676 m), weight 80.8 kg, SpO2 96 %.   General: No acute distress Mood and affect are appropriate Heart: Regular rate and rhythm no rubs murmurs or extra sounds Lungs: Clear to auscultation, breathing unlabored, no rales or wheezes Abdomen: Positive bowel sounds, soft nontender to palpation, nondistended Extremities: No clubbing, cyanosis, or edema Skin: No evidence of breakdown, no evidence of rash    Neurologic: Cranial nerves II through XII intact, motor strength is 5/5 in left and 0/5 right  deltoid, bicep, tricep, grip,3- R hip flexor, knee extensors, 0/5 ankle dorsiflexor and plantar flexor Sensory exam cannot assess due to aphasia  Cerebellar exam cannot perform on Right due to weakness Tone without spasticity akness Musculoskeletal: Full range of motion in all 4 extremities. No joint swelling   Assessment/Plan: 1. Functional deficits which require 3+ hours per day of interdisciplinary therapy in a comprehensive inpatient rehab setting.  Physiatrist is providing close team supervision and 24 hour management of active medical problems listed below.  Physiatrist and rehab team continue to assess barriers to discharge/monitor patient progress toward functional and medical goals  Care Tool:  Bathing    Body parts bathed by patient: Right  arm,Chest,Abdomen,Right upper leg,Left upper leg,Face   Body parts bathed by helper: Left arm,Front perineal area,Buttocks,Right lower leg,Left lower leg Body parts n/a: Left arm,Right lower leg,Buttocks   Bathing assist Assist Level: Moderate Assistance - Patient 50 - 74%     Upper Body Dressing/Undressing Upper body dressing   What is the patient wearing?: Pull over shirt    Upper body assist Assist Level: Moderate Assistance - Patient 50 - 74%    Lower Body Dressing/Undressing Lower body dressing      What is the patient wearing?: Pants,Incontinence brief     Lower body assist Assist for lower body dressing: Moderate Assistance - Patient 50 - 74%     Toileting Toileting    Toileting assist Assist for toileting: Dependent - Patient 0%     Transfers Chair/bed transfer  Transfers assist     Chair/bed transfer assist level: Moderate Assistance - Patient 50 - 74%     Locomotion Ambulation   Ambulation assist      Assist level: Moderate Assistance - Patient 50 - 74% Assistive device: Walker-rolling Max distance: 61ft   Walk 10 feet activity   Assist     Assist level: Moderate Assistance - Patient - 50 - 74% Assistive device: Walker-rolling,Orthosis   Walk 50 feet activity   Assist Walk 50 feet with 2 turns activity did not occur: Safety/medical concerns  Assist level: Moderate Assistance - Patient - 50 - 74% Assistive device: Walker-rolling,Orthosis    Walk 150 feet activity   Assist Walk 150 feet activity did not occur: Safety/medical concerns         Walk 10  feet on uneven surface  activity   Assist Walk 10 feet on uneven surfaces activity did not occur: Safety/medical concerns         Wheelchair     Assist Will patient use wheelchair at discharge?: Yes Type of Wheelchair: Manual    Wheelchair assist level: Maximal Assistance - Patient 25 - 49% Max wheelchair distance: 28ft    Wheelchair 50 feet with 2 turns  activity    Assist        Assist Level: Total Assistance - Patient < 25%   Wheelchair 150 feet activity     Assist      Assist Level: Total Assistance - Patient < 25%   Blood pressure 114/70, pulse (!) 58, temperature 98.4 F (36.9 C), resp. rate 20, height 5\' 6"  (1.676 m), weight 80.8 kg, SpO2 96 %.    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 -patient may  shower -ELOS/Goals: sup/minA,12/31 -Continue CIR,   2. Antithrombotics: -DVT/anticoagulation:Lovenox -antiplatelet therapy: Aspirin 325 mg daily and Plavix 75 mg daily x3 months then aspirin alone 3. Pain Management:ContinueTylenol prn 4. Mood:Provide emotional support -antipsychotic agents: N/A 5. Neuropsych: This patientis notcapable of making decisions on hisown behalf. 6. Skin/Wound Care:Routine skin checks 7. Fluids/Electrolytes/Nutrition:Routine in and outs with follow-up chemistries  12/19- will change IVFs to nocturnal 8. Hypertension. Lisinopril 20 mg daily. Monitor with increased mobility Vitals:   05/31/20 2052 06/01/20 0439  BP: 107/68 114/70  Pulse: 81 (!) 58  Resp: 16 20  Temp:  98.4 F (36.9 C)  SpO2: 95% 96%  12/23: BP is well controlled, continue Lisinopril 20mg  daily 9. Dysphagia. Dysphagia #2 Honey thick liquid. Monitor hydration. Speech therapy follow-up 10. New findings diabetes mellitus. Hemoglobin A1c 8.7. Lantus insulin 10 units nightly. Diabetic teaching CBG (last 3)  Recent Labs    05/31/20 1636 05/31/20 2058 06/01/20 0628  GLUCAP 98 143* 110*  12/20: excellent control 80s-90s: decreae lantus to 14U 12/21: well controlled: continue to monitor. 11. Hyperlipidemia. Lipitor 12. Ascending aortic aneurysm with dilation 4.1 cm. Recommendations annual imaging followed by CTA or  MRA. 13.  Pre renal azotemia, IVF at noc, creat normal 12/20 14.  Leukocytosis Resolved ,- 20K enterococcus completed macrobid, will repeat CBC in a.m. 15.  Urinary retention appreciate uro assist-  start flomax, will do voiding trial next week  16. Bradycardic to 60s asymptomatic 17. Constipation: schedule senna-docusate HS  12/18- LBM this AM- con't regimen  12/19- LBM yesterday.   LOS: 17 days A FACE TO FACE EVALUATION WAS PERFORMED  1/19 06/01/2020, 9:14 AM

## 2020-06-01 NOTE — Progress Notes (Signed)
Occupational Therapy Session Note  Patient Details  Name: Aaron Weiss MRN: [id number] Date of Birth: [birth year]  Today's Date: 06/01/2020 OT Individual Time: 1305-1401 OT Individual Time Calculation (min): 56 min    Short Term Goals: Week 3:  OT Short Term Goal 1 (Week 3): Pt will complete LB bathing with min assist at sit > stand level OT Short Term Goal 2 (Week 3): Pt will complete toilet transfer with min assist OT Short Term Goal 3 (Week 3): Pt will complete LB dressing (except footwear) with min assist  Skilled Therapeutic Interventions/Progress Updates:    Pt in bed to start session agreeable to therapy.  He declined wanting to wash up this session.  Worked on transferring from supine to sit EOB with mod assist.  Therapist then provided total assist for donning right shoe and AFO.  He then donned the left shoe with supervision.  He was able to complete transfer stand pivot to the wheelchair from the bed with mod assist.  Increased right knee buckling noted with pt taking very short steps with the LUE.  He was taken down to the therapy gym via wheelchair and then transferred to the mat with the same method and level.  Therapist completed stretching to finger flexors as well as assessing PROM in the right arm.  His RUE was then positioned on the mat for weightbearing tasks in sitting.  Had him work on functional reaching with the LUE to pick up clothespins and place on horizontal bars while weightbearing through the RLE and RUE.  Also had him reach to place a nut on a bolt for sustained weightbearing on the RUE in sitting.  Min facilitation needed to keep from falling to the right.  Progressed to working on active movement in the RUE using tilted stool with dycem.  Had pt work on maintaining upright posture while pushing the stool forward.  Pt with some slight shoulder movements noted but overall needed max assist to push the stool to the target.  Finished session with return to the  wheelchair at mod assist stand pivot and return to the room.  He was left up in the chair per his choice in preparation for next PT session.  Call button and phone in reach with safety belt in place.     Therapy Documentation Precautions:  Precautions Precautions: Fall Precaution Comments: aphasia Restrictions Weight Bearing Restrictions: No  Pain: Pain Assessment Pain Scale: Faces Pain Score: 0-No pain ADL: See Care Tool Section for some details of mobility and selfcare  Therapy/Group: Individual Therapy  Khylee Algeo OTR/L 06/01/2020, 4:23 PM

## 2020-06-01 NOTE — NC FL2 (Signed)
[place] MEDICAID FL2 LEVEL OF CARE SCREENING TOOL     IDENTIFICATION  Patient Name: Aaron Weiss Birthdate: May 19, 1966 Sex: male Admission Date (Current Location): 05/15/2020  [place] and [place] Number:   [place], [place])   Facility and Address:         Provider Number:    Attending Physician Name and Address:  Erick Colace, MD  Relative Name and Phone Number:  Onalee Hua [phone]    Current Level of Care: Hospital Recommended Level of Care: Skilled Nursing Facility Prior Approval Number:    Date Approved/Denied:   PASRR Number: Pending  Discharge Plan: SNF    Current Diagnoses: Patient Active Problem List   Diagnosis Date Noted  . Cerebral infarction due to unspecified occlusion or stenosis of left middle cerebral artery (HCC) 05/15/2020  . Left middle cerebral artery stroke (HCC) 05/15/2020  . Altered mental status   . Diabetes mellitus, type 2 (HCC) 05/07/2020  . Cerebral embolism with cerebral infarction 05/06/2020  . Carotid artery occlusion with cerebral infarction (HCC) 05/06/2020  . Aortic aneurysm (HCC) 05/06/2020  . CVA (cerebral vascular accident) (HCC) 05/06/2020    Orientation RESPIRATION BLADDER Height & Weight     Place,Situation,Self,Time  Normal Continent ([place]) Weight: 178 lb 2.1 oz (80.8 kg) Height:  5\' 6"  (167.6 cm)  BEHAVIORAL SYMPTOMS/MOOD NEUROLOGICAL BOWEL NUTRITION STATUS      Continent Diet (dys 2 textures, honey thick liquids)  AMBULATORY STATUS COMMUNICATION OF NEEDS Skin   Limited Assist Verbally Normal                       Personal Care Assistance Level of Assistance  Bathing,Feeding,Dressing Bathing Assistance: Limited assistance Feeding assistance: Limited assistance Dressing Assistance: Limited assistance     Functional Limitations Info             SPECIAL CARE FACTORS FREQUENCY  PT (By licensed PT),OT (By licensed OT),Speech therapy     PT Frequency: 5x a week OT Frequency: 5x a week      Speech Therapy Frequency: 5x a week      Contractures      Additional Factors Info                  Current Medications (06/01/2020):  This is the current hospital active medication list Current Facility-Administered Medications  Medication Dose Route Frequency Provider Last Rate Last Admin  . acetaminophen (TYLENOL) tablet 650 mg  650 mg Oral Q4H PRN 06/03/2020, PA-C   650 mg at 05/28/20 05/30/20   Or  . acetaminophen (TYLENOL) 160 MG/5ML solution 650 mg  650 mg Per Tube Q4H PRN Angiulli, 3903, PA-C       Or  . acetaminophen (TYLENOL) suppository 650 mg  650 mg Rectal Q4H PRN Angiulli, Mcarthur Rossetti, PA-C      . aspirin EC tablet 325 mg  325 mg Oral Daily Mcarthur Rossetti, PA-C   325 mg at 06/01/20 06/03/20   Or  . aspirin suppository 300 mg  300 mg Rectal Daily Angiulli, 0092, PA-C      . atorvastatin (LIPITOR) tablet 80 mg  80 mg Oral Daily Mcarthur Rossetti, PA-C   80 mg at 06/01/20 06/03/20  . bisacodyl (DULCOLAX) suppository 10 mg  10 mg Rectal Daily PRN 3300, MD   10 mg at 05/22/20 1343  . Chlorhexidine Gluconate Cloth 2 % PADS 6 each  6 each Topical BID Kirsteins, 05/24/20, MD  6 each at 06/01/20 0541  . clopidogrel (PLAVIX) tablet 75 mg  75 mg Oral Daily Charlton Amor, PA-C   75 mg at 06/01/20 1219  . enoxaparin (LOVENOX) injection 40 mg  40 mg Subcutaneous Q24H Charlton Amor, PA-C   40 mg at 05/31/20 2054  . insulin aspart (novoLOG) injection 0-15 Units  0-15 Units Subcutaneous TID WC AngiulliMcarthur Rossetti, PA-C   2 Units at 05/30/20 1224  . insulin glargine (LANTUS) injection 14 Units  14 Units Subcutaneous QHS Horton Chin, MD   14 Units at 05/31/20 2054  . lidocaine (XYLOCAINE) 2 % jelly 1 application  1 application Urethral PRN Angiulli, Mcarthur Rossetti, PA-C      . lisinopril (ZESTRIL) tablet 20 mg  20 mg Oral Daily Charlton Amor, PA-C   20 mg at 06/01/20 7588  . Resource ThickenUp Clear   Oral PRN Angiulli, Mcarthur Rossetti, PA-C      .  senna-docusate (Senokot-S) tablet 1 tablet  1 tablet Oral QHS Raulkar, Drema Pry, MD   1 tablet at 05/31/20 2054  . tamsulosin (FLOMAX) capsule 0.4 mg  0.4 mg Oral QPC supper Erick Colace, MD   0.4 mg at 05/31/20 1734     Discharge Medications: Please see discharge summary for a list of discharge medications.  Relevant Imaging Results:  Relevant Lab Results:   Additional Information    [name]

## 2020-06-01 NOTE — Progress Notes (Signed)
Physical Therapy Weekly Progress Note  Patient Details  Name: Aaron Weiss MRN: [id number] Date of Birth: 1966/05/31  Beginning of progress report period: May 24, 2020 End of progress report period: June 01, 2020  Today's Date: 06/01/2020 PT Individual Time: 0900-1000 [id number] PT Individual Time Calculation (min): 60 min and 30 min   Patient has met 3 of 4 short term goals.  Pt is making steady progress towards LTG. Noted improvement in attention to the R, midline orientation, postural control and activation of the RLE noted through improved safety with bed mobility, transfers, and gait training. At this time pt has progressed to min assist- supervision assist transfers for bed mobility with use of bed features, min assist for squat pivot tranfers, and mod assist gait training wth RW and AFO up to 18f. At times pt can require as little as min assist for gait, but mild R inattention limits safety for turns and longer distances.   Patient continues to demonstrate the following deficits muscle weakness, muscle joint tightness and muscle paralysis, decreased cardiorespiratoy endurance, impaired timing and sequencing, motor apraxia, decreased coordination and decreased motor planning, decreased visual perceptual skills, decreased attention to right, decreased attention, decreased awareness, decreased problem solving, decreased safety awareness, decreased memory and delayed processing and decreased sitting balance, decreased standing balance, decreased postural control, hemiplegia and decreased balance strategies and therefore will continue to benefit from skilled PT intervention to increase functional independence with mobility.  Patient progressing toward long term goals..  Continue plan of care.  PT Short Term Goals Week 2:  PT Short Term Goal 1 (Week 2): pt will ambulate 256fwith min assist and LRAD PT Short Term Goal 1 - Progress (Week 2): Progressing toward goal PT Short Term  Goal 2 (Week 2): Pt will perform stand pivot transfers with mod assist PT Short Term Goal 2 - Progress (Week 2): Met PT Short Term Goal 3 (Week 2): Pt will perform swing pivot transfers with min assist or better in both directions PT Short Term Goal 3 - Progress (Week 2): Met PT Short Term Goal 4 (Week 2): Pt will demonstrate increased activation of his R glutes/quads and demonstrate carryover into gait training PT Short Term Goal 4 - Progress (Week 2): Met Week 3:  PT Short Term Goal 1 (Week 3): STG=LTG due to ELOS  Skilled Therapeutic Interventions/Progress Updates:   Pt received supine in bed and agreeable to PT. Supine>sit transfer with min assist for safety. Lower body dressing and donning shoes with RAFO while sitting EOB. Supervision assist to maintain sitting balance up to 5 minutes, and max assist for clothing management over the RLE. Sit<>stand with RW and min assist for pt to pull pants to waist with only CGA to maintain balance.   [facility] and only min cues for set up on this day. Pt then transported to rehab gym. Gait training with RW and AFO x 5030fnd [id number] mod assist initially due to excessive step length resulting in moderate GR with stance on the RLE. Noted improvement in knee control with decreased step length on the RLE. +2 present for WC follow for safety.   Seated NMR:  HS curl x 10 level 1 tband  Heel slides x 10 pillow case on foot Hip abduction x 10 level 1 tband  Cues for decreased compensation through trunk and AAROM intermittently to improve knee flexion ROM. Noted to have improved HS activation with increased reps.   Nustep.  3 min x2, level 3-4. Prolonged therapetic rest break between bouts. thigh support on the RLE throughout to improve knee/hip position.   Pt returned to room and performed stand pivot transfer to bed with RW and min-mod assist from PT with cues for decreased step length. Sit>supine completed with min assist at  the RLE and left supine in bed with call bell in reach and all needs met.   Session 2 .   Pt received sitting in WC and agreeable to PT. Son and brother present for entire treatment session.   Sit<>stand from [facility] with CGA from PT and cues for improved WB through the RLE once in standing. PT assisted pt to position RUE in hand splint on RW. Gait training with mod assist x 40f with BUE support on RW and AFO. Intermittent GR noted throughout with cues for decreased speed and step length with mild improvement of knee control. One near LOB with poor step length/height on the RLE.   Pt performed WC mobility through hall with supervision assist and min cues for improved attention to the right to avoid obstacles on the R side.   Dynamic standing balance to perform lateral reach to the R wit hthe LUE to force WB in the RLE followed but horse shoe toss to target x 7. Min assist from PT through and RUE supported on RW.   Pt returned to room and performed squat pivot transfer to bed with min assist on the R. Sit>supine completed with min assist for RLE management. Pt left supine in bed with call bell in reach and all needs met.   Throughout session son and brother stating that they are unsure of d/c plan stating that they were going to try to wait until next week to assess progress to finalize d/c plan for home or SNF. PT educated family on need to make decision soon to allow rehab team to obtain all needed DME.          Therapy Documentation Precautions:  Precautions Precautions: Fall Precaution Comments: aphasia Restrictions Weight Bearing Restrictions: No Pain: Pain Assessment Pain Scale: 0-10 Pain Score: 0-No pain  Therapy/Group: Individual Therapy  ALorie Phenix12/23/2021, 10:03 AM

## 2020-06-02 ENCOUNTER — Inpatient Hospital Stay (HOSPITAL_COMMUNITY): Payer: BC Managed Care – PPO | Admitting: Physical Therapy

## 2020-06-02 ENCOUNTER — Inpatient Hospital Stay (HOSPITAL_COMMUNITY): Payer: BC Managed Care – PPO | Admitting: Speech Pathology

## 2020-06-02 ENCOUNTER — Inpatient Hospital Stay (HOSPITAL_COMMUNITY): Payer: BC Managed Care – PPO | Admitting: Occupational Therapy

## 2020-06-02 LAB — GLUCOSE, CAPILLARY
Glucose-Capillary: 104 mg/dL — ABNORMAL HIGH (ref 70–99)
Glucose-Capillary: 99 mg/dL (ref 70–99)
Glucose-Capillary: 104 mg/dL — ABNORMAL HIGH (ref 70–99)
Glucose-Capillary: 124 mg/dL — ABNORMAL HIGH (ref 70–99)

## 2020-06-02 MED ORDER — LISINOPRIL 10 MG PO TABS
10.0000 mg | ORAL_TABLET | Freq: Every day | ORAL | Status: DC
  Administered 2020-06-03 – 2020-06-05 (×3): 10 mg via ORAL
  Filled 2020-06-02 (×4): qty 1

## 2020-06-02 NOTE — Progress Notes (Signed)
Patient ID: Aaron Weiss, male   DOB: 23-Feb-1966, 54 y.o.   MRN: [id number]   Sw met with pt son and daughter yesterday and informed family of the available options a discharge. Family deciding on discharging patient home and SNF (SW informed family of barriers due to insurance, but sw will attempt). Sw emailed patient son SNF, [facility] and [name].   [name], [name]

## 2020-06-02 NOTE — Progress Notes (Signed)
Physical Therapy Session Note  Patient Details  Name: Aaron Weiss MRN: [id number] Date of Birth: 1966/01/11  Today's Date: 06/02/2020 PT Individual Time: 0800-0900 PT Individual Time Calculation (min): 60 min   Short Term Goals: Week 3:  PT Short Term Goal 1 (Week 3): STG=LTG due to ELOS  Skilled Therapeutic Interventions/Progress Updates: Pt presented in bed with nsg present completing breakfast agreeable to therapy. Pt denies pain. PTA donned TED hose total A and pt performed supine to sit with use of bed features with CGA! PTA donned shoes/AFO total A with pt performed stand pivot to w/c with modA. Pt transported to day room and set up for Lite Gait use. Required modA to step up onto Lite Gait. Participated in BWS gait training as follows: 3:31 at 0.2 mph for 59 ft. Pt required max facilitation for lateral wt shifting and max verbal cues for increasing L step length to meet R foot. PTA providing max facilitation to promote quad activation /knee extension in stance phase. Pt required seated rest breaks but performed additional bouts of 3.15 at 0.3 mph for 99f and 3 min at 0.3 mph for 746fOn third bout pt did demonstrate improved step length of L foot and was able to maintain BOS to keep feel on each side of white line. Pt required mod A to step off of treadmill and propelled ~10027fowards room but notably fatigued and PTA assisted remaining distance. Pt was able to perform stand pivot with modA and RW to bed and performed sit to supine with minA on flat bed with use of bed rail. Pt repositioned to comfort and left with bed alarm on, call bell within reach and needs met.       Therapy Documentation Precautions:  Precautions Precautions: Fall Precaution Comments: aphasia Restrictions Weight Bearing Restrictions: No General:   Vital Signs:  Pain: Pain Assessment Pain Scale: 0-10 Pain Score: 0-No pain    Therapy/Group: Individual Therapy  Aaron Weiss  Aaron Weiss,  PTA  06/02/2020, 12:37 PM

## 2020-06-02 NOTE — Progress Notes (Signed)
Speech Language Pathology Daily Session Note  Patient Details  Name: Aaron Weiss MRN: [id number] Date of Birth: Apr 29, 1966  Today's Date: 06/02/2020 SLP Individual Time: 1330-1430 SLP Individual Time Calculation (min): 60 min  Short Term Goals: Week 3: SLP Short Term Goal 1 (Week 3): STG=LTG due to remaining length of stay  Skilled Therapeutic Interventions:   Patient seen for skilled ST session focusing on swallow and speech-language goals. Patient consumed cup sips of thin liquids and did not exhibit immediate coughing, but did exhibit brief delayed coughing episode during session. Voice remained clear and patient did not endorse any discomfort or difficulty. Patient required phrase completion cues and initial phoneme cues to name objects from [place]. Without cues, he would name everything as "mice". He did exhibit some awareness to incorrect naming but this was not consistent. He required maximalA cues for divergent naming task (ie: name an animal). Social/Pragmatic language was good as far as basic level greetings ,with patient saying "thank you", reaching out to shake SLP's hand at end of session. Patient was quick to respond for naming tasks and structured language tasks but did not independently initiate. He appeared to respond accurately as far as immediate needs yes/no questions. Patient continues to benefit from skilled SLP intervention to maximize swallow, speech, language function prior to discharge.   Pain Pain Assessment Pain Scale: 0-10 Pain Score: 0-No pain  Therapy/Group: Individual Therapy  Sonia Baller, MA, CCC-SLP Speech Therapy

## 2020-06-02 NOTE — Progress Notes (Signed)
Patient going on day three of no bowel movement, tried to encourage patient to use suppository, pt refused. Only stool softner scheduled is at bedtime. Pt denies abdominal pain.

## 2020-06-02 NOTE — Progress Notes (Signed)
Occupational Therapy Session Note  Patient Details  Name: Aaron Weiss MRN: [id number] Date of Birth: 04/27/1966  Today's Date: 06/02/2020 OT Individual Time: 1100-1200 OT Individual Time Calculation (min): 60 min    Short Term Goals: Week 3:  OT Short Term Goal 1 (Week 3): Pt will complete LB bathing with min assist at sit > stand level OT Short Term Goal 2 (Week 3): Pt will complete toilet transfer with min assist OT Short Term Goal 3 (Week 3): Pt will complete LB dressing (except footwear) with min assist  Skilled Therapeutic Interventions/Progress Updates:    Pt received in bed and agreeable to b/d today.  Worked on bridging, rolling and actively lifting L leg and R leg with A to doff old pants, change brief, bathe and don new pants.  Pt rolled and sat up with min A.   Using RW stood with min A and completed stand pivot to wc with mod A.  At sink, worked on UB bathing with min A and donning shirt with mod A.  Pt taken to day room to participate in a Christmas song sing along. While there pt worked on continuous [employer] with A/arom using a dowel bar. Pt has developing sh abd, trace triceps, developing tone in fingers.  Using dowel, worked on push pull exercises, pushing dowel down, rotating dowel with numerous repetitions to facilitate motor recovery.   Pt taken back to room, belt alarm on, lap tray in place, call light in reach.   Therapy Documentation Precautions:  Precautions Precautions: Fall Precaution Comments: aphasia Restrictions Weight Bearing Restrictions: No      Pain: Pain Assessment Pain Scale: 0-10 Pain Score: 0-No pain   Therapy/Group: Individual Therapy  Kady Toothaker 06/02/2020, 12:31 PM

## 2020-06-02 NOTE — Progress Notes (Signed)
[place] PHYSICAL MEDICINE & REHABILITATION PROGRESS NOTE   Subjective/Complaints:  Off IVF, intake poor for fluids , good meal intake  ROS: Limited by aphasia  Objective:   No results found. Recent Labs    05/31/20 1242  WBC 7.7  HGB 13.4  HCT 38.3*  PLT 271   No results for input(s): NA, K, CL, CO2, GLUCOSE, BUN, CREATININE, CALCIUM in the last 72 hours.  Intake/Output Summary (Last 24 hours) at 06/02/2020 0952 Last data filed at 06/02/2020 0757 Gross per 24 hour  Intake 360 ml  Output 650 ml  Net -290 ml   Physical Exam: Vital Signs Blood pressure 103/67, pulse (!) 54, temperature 98.4 F (36.9 C), temperature source Oral, resp. rate 19, height 5\' 6"  (1.676 m), weight 80.8 kg, SpO2 97 %.    General: No acute distress Mood and affect are appropriate Heart: Regular rate and rhythm no rubs murmurs or extra sounds Lungs: Clear to auscultation, breathing unlabored, no rales or wheezes Abdomen: Positive bowel sounds, soft nontender to palpation, nondistended Extremities: No clubbing, cyanosis, or edema Skin: No evidence of breakdown, no evidence of rash      Neurologic: Cranial nerves II through XII intact, motor strength is 5/5 in left and 0/5 right  deltoid, bicep, tricep, grip,3- R hip flexor, knee extensors, 0/5 ankle dorsiflexor and plantar flexor Sensory exam cannot assess due to aphasia  Cerebellar exam cannot perform on Right due to weakness Tone without spasticity akness Musculoskeletal: Full range of motion in all 4 extremities. No joint swelling   Assessment/Plan: 1. Functional deficits which require 3+ hours per day of interdisciplinary therapy in a comprehensive inpatient rehab setting.  Physiatrist is providing close team supervision and 24 hour management of active medical problems listed below.  Physiatrist and rehab team continue to assess barriers to discharge/monitor patient progress toward functional and medical goals  Care  Tool:  Bathing    Body parts bathed by patient: Right arm,Chest,Abdomen,Right upper leg,Left upper leg,Face   Body parts bathed by helper: Left arm,Front perineal area,Buttocks,Right lower leg,Left lower leg Body parts n/a: Left arm,Right lower leg,Buttocks   Bathing assist Assist Level: Moderate Assistance - Patient 50 - 74%     Upper Body Dressing/Undressing Upper body dressing   What is the patient wearing?: Pull over shirt    Upper body assist Assist Level: Moderate Assistance - Patient 50 - 74%    Lower Body Dressing/Undressing Lower body dressing      What is the patient wearing?: Pants,Incontinence brief     Lower body assist Assist for lower body dressing: Moderate Assistance - Patient 50 - 74%     Toileting Toileting    Toileting assist Assist for toileting: Dependent - Patient 0%     Transfers Chair/bed transfer  Transfers assist     Chair/bed transfer assist level: Moderate Assistance - Patient 50 - 74% (stand pivot)     Locomotion Ambulation   Ambulation assist      Assist level: Moderate Assistance - Patient 50 - 74% Assistive device: Walker-rolling Max distance: 40ft   Walk 10 feet activity   Assist     Assist level: Moderate Assistance - Patient - 50 - 74% Assistive device: Walker-rolling,Orthosis   Walk 50 feet activity   Assist Walk 50 feet with 2 turns activity did not occur: Safety/medical concerns  Assist level: Moderate Assistance - Patient - 50 - 74% Assistive device: Walker-rolling,Orthosis    Walk 150 feet activity   Assist Walk 150 feet activity  did not occur: Safety/medical concerns         Walk 10 feet on uneven surface  activity   Assist Walk 10 feet on uneven surfaces activity did not occur: Safety/medical concerns         Wheelchair     Assist Will patient use wheelchair at discharge?: Yes Type of Wheelchair: Manual    Wheelchair assist level: Maximal Assistance - Patient 25 - 49% Max  wheelchair distance: 81ft    Wheelchair 50 feet with 2 turns activity    Assist        Assist Level: Total Assistance - Patient < 25%   Wheelchair 150 feet activity     Assist      Assist Level: Total Assistance - Patient < 25%   Blood pressure 103/67, pulse (!) 54, temperature 98.4 F (36.9 C), temperature source Oral, resp. rate 19, height 5\' 6"  (1.676 m), weight 80.8 kg, SpO2 97 %.    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 -patient may  shower -ELOS/Goals: sup/minA,12/31 -Continue CIR,   2. Antithrombotics: -DVT/anticoagulation:Lovenox -antiplatelet therapy: Aspirin 325 mg daily and Plavix 75 mg daily x3 months then aspirin alone 3. Pain Management:ContinueTylenol prn 4. Mood:Provide emotional support -antipsychotic agents: N/A 5. Neuropsych: This patientis notcapable of making decisions on hisown behalf. 6. Skin/Wound Care:Routine skin checks 7. Fluids/Electrolytes/Nutrition:Routine in and outs with follow-up chemistries  12/19- will change IVFs to nocturnal 8. Hypertension. Lisinopril 20 mg daily. Monitor with increased mobility Vitals:   06/01/20 1942 06/02/20 0555  BP: 103/61 103/67  Pulse: 63 (!) 54  Resp: 18 19  Temp: 98.3 F (36.8 C) 98.4 F (36.9 C)  SpO2: 96% 97%  12/24: BP issoft   onLisinopril 20mg  daily- will reduce to 10mg  9. Dysphagia. Dysphagia #2 Honey thick liquid. Monitor hydration. Speech therapy follow-up 10. New findings diabetes mellitus. Hemoglobin A1c 8.7. Lantus insulin 10 units nightly. Diabetic teaching CBG (last 3)  Recent Labs    06/01/20 1734 06/01/20 2104 06/02/20 0600  GLUCAP 100* 111* 104*  12/20: excellent control 80s-90s: decreae lantus to 14U 12/21: well controlled: continue to monitor. 11. Hyperlipidemia.  Lipitor 12. Ascending aortic aneurysm with dilation 4.1 cm. Recommendations annual imaging followed by CTA or MRA. 13.  Pre renal azotemia, resolved  15.  Urinary retention appreciate uro assist-  start flomax, will do voiding trial next week  16. Bradycardic to 60s-on average asymptomatic no neg inotropic meds   LOS: 18 days A FACE TO FACE EVALUATION WAS PERFORMED  06/04/20 06/02/2020, 9:52 AM

## 2020-06-02 NOTE — Progress Notes (Signed)
Pts urine collected in foley bag appears to be bloody w/clots. Pt denies pain or discomfort. Provider will be made aware.

## 2020-06-02 NOTE — Progress Notes (Signed)
Patient ID: Aaron Weiss, male   DOB: 02-10-1966, 54 y.o.   MRN: [id number]   Sw followed up to inform patient son of patient not any SNF benefits currently, due to not meeting deductible yet. SW will continue to assist pt/family with d/c home.  [place], [place] [phone]

## 2020-06-03 LAB — GLUCOSE, CAPILLARY
Glucose-Capillary: 101 mg/dL — ABNORMAL HIGH (ref 70–99)
Glucose-Capillary: 98 mg/dL (ref 70–99)
Glucose-Capillary: 157 mg/dL — ABNORMAL HIGH (ref 70–99)
Glucose-Capillary: 149 mg/dL — ABNORMAL HIGH (ref 70–99)

## 2020-06-04 ENCOUNTER — Inpatient Hospital Stay (HOSPITAL_COMMUNITY): Payer: BC Managed Care – PPO | Admitting: Speech Pathology

## 2020-06-04 ENCOUNTER — Inpatient Hospital Stay (HOSPITAL_COMMUNITY): Payer: BC Managed Care – PPO | Admitting: Occupational Therapy

## 2020-06-04 ENCOUNTER — Inpatient Hospital Stay (HOSPITAL_COMMUNITY): Payer: BC Managed Care – PPO

## 2020-06-04 LAB — GLUCOSE, CAPILLARY
Glucose-Capillary: 123 mg/dL — ABNORMAL HIGH (ref 70–99)
Glucose-Capillary: 170 mg/dL — ABNORMAL HIGH (ref 70–99)
Glucose-Capillary: 128 mg/dL — ABNORMAL HIGH (ref 70–99)
Glucose-Capillary: 104 mg/dL — ABNORMAL HIGH (ref 70–99)

## 2020-06-04 NOTE — Progress Notes (Signed)
Patient threw up during his dinner w/ supervision, asked if he was any nausea, pt stated that he was. No medication in [facility] for nausea, will make the oncoming nurse aware of episode emesis bag at bedside.   [place] Ma

## 2020-06-04 NOTE — Progress Notes (Signed)
Physical Therapy Session Note  Patient Details  Name: Aaron Weiss MRN: [id number] Date of Birth: [birth year]  Today's Date: 06/04/2020 PT Individual Time: 3833-3832 PT Individual Time Calculation (min): 43 min   Short Term Goals: Week 3:  PT Short Term Goal 1 (Week 3): STG=LTG due to ELOS  Skilled Therapeutic Interventions/Progress Updates:    Presents in bed and agreeable to therapy session. Unable to state if he had therapy yet this morning. Pt able to come to EOB with min assist and impulsive in nature throughout transition while scooting EOB. Assisted pt with donning of tedhose and shoes and R AFO for time management. Cues for safety needed during sit <> stands and transfers with max cues needed for attention and management of RUE during transfers and mobility. Min assist physically for sit <> stands and stand step transfer to w/c. Min assist for squat pivot from w/c to mat table with cues for safety and awareness of RUE and RLE placement. Focused on NMR in standing and during gait for RLE activation, coordination, and strengthening. Performed targeted hip flexion in standing and then returning RLE to same spot on floor x 10 reps with mod cues and mod accuracy to reach target. Lateral sidestepping to R and L x 2 reps each direction x 8-10' each trial with focus on cues for sequencing and foot placement. Gait trial x 20' with mod assist due to impulsive nature and poor awareness of RLE foot placement. Set up in w/c at need of session with all needs in reach and lap tray in place for RUE support.   Therapy Documentation Precautions:  Precautions Precautions: Fall Precaution Comments: aphasia Restrictions Weight Bearing Restrictions: No  Pain: Denies pain.  Therapy/Group: Individual Therapy  Karolee Stamps Darrol Poke, PT, DPT, CBIS  06/04/2020, 12:02 PM

## 2020-06-04 NOTE — Progress Notes (Signed)
Speech Language Pathology Daily Session Note  Patient Details  Name: Aaron Weiss MRN: [id number] Date of Birth: [birth year]  Today's Date: 06/04/2020 SLP Individual Time: 0730-0800 SLP Individual Time Calculation (min): 30 min  Short Term Goals: Week 3: SLP Short Term Goal 1 (Week 3): STG=LTG due to remaining length of stay  Skilled Therapeutic Interventions: Skilled treatment session focused on dysphagia and communication goals. SLP facilitated session by providing skilled observation with breakfast meal of Dys. 3 textures with honey-thick liquids. Patient declined his dentures despite encouragement and required more than a reasonable amount of time for mastication of Dys. 3 textures, however, no overt s/s of aspiration were noted. Recommend patient continue current diet. Patient verbally responded to simple questions in regards to tray set-up with extra time. Patient also spontaneously requested to use the commode and was transferred to the [facility] via the stedy with +2 assist for safety.  Patient had incontinence of bowel due to urgency and required total A for peri-care and to donn new brief and pants. Patient transferred back to bed at end of session and left with RN present. Continue with current plan of care.      Pain No/Denies Pain   Therapy/Group: Individual Therapy  Aeralyn Barna 06/04/2020, 12:49 PM

## 2020-06-04 NOTE — Progress Notes (Signed)
Occupational Therapy Session Note  Patient Details  Name: Aaron Weiss MRN: [id number] Date of Birth: 1965/10/07  Today's Date: 06/04/2020 OT Individual Time: 0093-8182 OT Individual Time Calculation (min): 55 min   Skilled Therapeutic Interventions/Progress Updates:    Pt greeted in bed with no c/o pain. He declined showering, toileting, or participating in any other ADL related tasks. Pt agreeable to work on his affected arm, sitting balance, and some sit<stands during session. Bed mobility completed with supervision assist using the bedrail. He then engaged in ROM exercises with gravity minimized using the UE ranger for the affected Rt side. We listened to meaningful music during session and moved the ranger in beat to favorite songs with emphasis placed on multiple repetitions. Note that pt required verbal and manual cues for decreasing truncal compensatory strategies. Vcs for erect upright posture (supervision for sitting balance throughout) and to decrease involvement of unaffected Lt side when trying to facilitate movement with Rt UE. He has some active internal and external rotation abilities! Transitioned to NMR using the small pedal bike with Rt hand strapped to pedal using ACE wrap, pt tolerating approx ~1 minute of forward/backward pedaling before needing rest due to fatigue. Sit<stands with RW completed x2 with Min A and assistance to strap the Rt hand into the orthotic component. He also needs assistance for stabilizing the Rt knee in standing and Max A to advance the Rt LE to sidestep towards [facility] (without wearing the AFO). Supervision for transitioning to supine. Pt remained in bed at close of session with the Rt UE elevated and Rt LE wearing his PRAFO boot. All needs within reach and bed alarm set.   Pt tolerated Saebo stim one applied to anterior/posterior deltoids for 40 minutes during session while engaged in NMR activity stated above. Skin intact once pads were removed with pt  able to express when settings were most comfortable for him. Saebo stim one parameters are listed below:   Saebo Stim One 330 pulse width 35 Hz pulse rate On 8 sec/ off 8 sec Ramp up/ down 2 sec Symmetrical Biphasic wave form  Max intensity at 500 Ohm load   Therapy Documentation Precautions:  Precautions Precautions: Fall Precaution Comments: aphasia Restrictions Weight Bearing Restrictions: No Vital Signs: Therapy Vitals Temp: 97.8 F (36.6 C) Temp Source: Oral Pulse Rate: 84 Resp: 17 BP: 119/85 Patient Position (if appropriate): Sitting Oxygen Therapy SpO2: 99 % O2 Device: Room Air ADL: ADL Eating: Moderate assistance Where Assessed-Eating: Wheelchair Grooming: Minimal assistance Where Assessed-Grooming: Wheelchair Upper Body Bathing: Minimal assistance Where Assessed-Upper Body Bathing: Wheelchair Lower Body Bathing: Moderate assistance Where Assessed-Lower Body Bathing: Chair Upper Body Dressing: Moderate assistance Where Assessed-Upper Body Dressing: Chair Lower Body Dressing: Maximal assistance Where Assessed-Lower Body Dressing: Chair Toileting: Dependent Where Assessed-Toileting: Bedside Commode Toilet Transfer: Moderate assistance Toilet Transfer Method: Squat pivot Toilet Transfer Equipment: Drop arm bedside commode ADL Comments: sit pivot transfer bed to/from w/c max A     Therapy/Group: Individual Therapy  Aaron Weiss A Aaron Weiss 06/04/2020, 2:27 PM

## 2020-06-05 ENCOUNTER — Inpatient Hospital Stay (HOSPITAL_COMMUNITY): Payer: BC Managed Care – PPO | Admitting: Occupational Therapy

## 2020-06-05 ENCOUNTER — Inpatient Hospital Stay (HOSPITAL_COMMUNITY): Payer: BC Managed Care – PPO

## 2020-06-05 ENCOUNTER — Inpatient Hospital Stay (HOSPITAL_COMMUNITY): Payer: BC Managed Care – PPO | Admitting: Physical Therapy

## 2020-06-05 LAB — CREATININE, SERUM
Creatinine, Ser: 0.96 mg/dL (ref 0.61–1.24)
GFR, Estimated: >60 mL/min (ref >60)

## 2020-06-05 LAB — GLUCOSE, CAPILLARY
Glucose-Capillary: 96 mg/dL (ref 70–99)
Glucose-Capillary: 138 mg/dL — ABNORMAL HIGH (ref 70–99)
Glucose-Capillary: 138 mg/dL — ABNORMAL HIGH (ref 70–99)

## 2020-06-05 MED ORDER — ONDANSETRON HCL 4 MG PO TABS: 4.0000 mg | ORAL_TABLET | Freq: Three times a day (TID) | ORAL | Status: DC | PRN

## 2020-06-05 MED ORDER — LISINOPRIL 5 MG PO TABS
5.0000 mg | ORAL_TABLET | Freq: Every day | ORAL | Status: DC
  Administered 2020-06-06 – 2020-06-09 (×4): 5 mg via ORAL
  Filled 2020-06-05 (×4): qty 1

## 2020-06-05 NOTE — Progress Notes (Signed)
Patient ID: Aaron Weiss, male   DOB: 1965-11-24, 54 y.o.   MRN: [id number]   SW received call from son. Son reports having a busy week and is unsure if he'll be able to attend family education this week. SW informed son of the importance of getting this scheduled with the anticipated discharge date of Friday. Son states he will see what he can do. SW will continue to attempt.   [place], [place] [phone]

## 2020-06-05 NOTE — Progress Notes (Signed)
Patient ID: Aaron Weiss, male   DOB: 01-Nov-1965, 54 y.o.   MRN: [id number]   Sw called patient sister Aaron Weiss) and Brother Forensic scientist) to see if available for family education. No answer, left VM.

## 2020-06-05 NOTE — Progress Notes (Signed)
Aaron Weiss   Subjective/Complaints: No complaints this morning. Patient nauseous overnight with emesis. I have ordered prn Zofran.  BP soft.   ROS: Limited by aphasia, denies pain  Objective:   No results found. No results for input(s): WBC, HGB, HCT, PLT in the last 72 hours. Recent Labs    06/05/20 0605  CREATININE 0.96    Intake/Output Summary (Last 24 hours) at 06/05/2020 1215 Last data filed at 06/05/2020 0754 Gross per 24 hour  Intake 436 ml  Output 300 ml  Net 136 ml   Physical Exam: Vital Signs Blood pressure 102/70, pulse 62, temperature 97.9 F (36.6 C), resp. rate 18, height 5\' 6"  (1.676 m), weight 80.8 kg, SpO2 94 %. Gen: no distress, normal appearing HEENT: oral mucosa pink and moist, NCAT Cardio: Reg rate Chest: normal effort, normal rate of breathing Abd: soft, non-distended Ext: no edema Skin: intact Neurologic: Cranial nerves II through XII intact, motor strength is 5/5 in left and 0/5 right  deltoid, bicep, tricep, grip,3- R hip flexor, knee extensors, 0/5 ankle dorsiflexor and plantar flexor Sensory exam cannot assess due to aphasia  Cerebellar exam cannot perform on Right due to weakness Tone without spasticity akness Musculoskeletal: Full range of motion in all 4 extremities. No joint swelling   Assessment/Plan: 1. Functional deficits which require 3+ hours per day of interdisciplinary therapy in a comprehensive inpatient rehab setting.  Physiatrist is providing close team supervision and 24 hour management of active medical problems listed below.  Physiatrist and rehab team continue to assess barriers to discharge/monitor patient progress toward functional and medical goals  Care Tool:  Bathing    Body parts bathed by patient: Right arm,Chest,Abdomen,Right upper leg,Left upper leg,Face,Front perineal area   Body parts bathed by helper: Left arm,Buttocks,Right lower leg,Left lower leg Body  parts n/a: Left arm,Right lower leg,Buttocks   Bathing assist Assist Level: Moderate Assistance - Patient 50 - 74%     Upper Body Dressing/Undressing Upper body dressing   What is the patient wearing?: Pull over shirt    Upper body assist Assist Level: Moderate Assistance - Patient 50 - 74%    Lower Body Dressing/Undressing Lower body dressing      What is the patient wearing?: Pants,Incontinence brief     Lower body assist Assist for lower body dressing: Moderate Assistance - Patient 50 - 74%     Toileting Toileting    Toileting assist Assist for toileting: Dependent - Patient 0%     Transfers Chair/bed transfer  Transfers assist     Chair/bed transfer assist level: Minimal Assistance - Patient > 75%     Locomotion Ambulation   Ambulation assist      Assist level: Moderate Assistance - Patient 50 - 74% Assistive device: Walker-rolling Max distance: 20'   Walk 10 feet activity   Assist     Assist level: Moderate Assistance - Patient - 50 - 74% Assistive device: Walker-rolling,Orthosis   Walk 50 feet activity   Assist Walk 50 feet with 2 turns activity did not occur: Safety/medical concerns  Assist level: Moderate Assistance - Patient - 50 - 74% Assistive device: Walker-rolling,Orthosis    Walk 150 feet activity   Assist Walk 150 feet activity did not occur: Safety/medical concerns         Walk 10 feet on uneven surface  activity   Assist Walk 10 feet on uneven surfaces activity did not occur: Safety/medical concerns  Wheelchair     Assist Will patient use wheelchair at discharge?: Yes Type of Wheelchair: Manual    Wheelchair assist level: Maximal Assistance - Patient 25 - 49% Max wheelchair distance: 24ft    Wheelchair 50 feet with 2 turns activity    Assist        Assist Level: Total Assistance - Patient < 25%   Wheelchair 150 feet activity     Assist      Assist Level: Total Assistance -  Patient < 25%   Blood pressure 102/70, pulse 62, temperature 97.9 F (36.6 C), resp. rate 18, height 5\' 6"  (1.676 m), weight 80.8 kg, SpO2 94 %.    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 -patient may  shower -ELOS/Goals: sup/minA,12/31   -Continue CIR 2. Antithrombotics: -DVT/anticoagulation:Lovenox -antiplatelet therapy: Aspirin 325 mg daily and Plavix 75 mg daily x3 months then aspirin alone 3. Pain Management:Continue Tylenol prn 4. Mood:Provide emotional support -antipsychotic agents: N/A 5. Neuropsych: This patientis notcapable of making decisions on hisown behalf. 6. Skin/Wound Care:Routine skin checks 7. Fluids/Electrolytes/Nutrition:Routine in and outs with follow-up chemistries  12/19- will change IVFs to nocturnal 8. Hypertension. Lisinopril 20 mg daily. Monitor with increased mobility Vitals:   06/04/20 1938 06/05/20 0520  BP: 103/68 102/70  Pulse: 76 62  Resp: 17 18  Temp: 97.9 F (36.6 C) 97.9 F (36.6 C)  SpO2: 98% 94%  12/24: BP issoft   onLisinopril 20mg  daily- will reduce to 10mg   12/27: BP continues to be soft: Decrease Lisinopril to 5mg .  9. Dysphagia. Dysphagia #2 Honey thick liquid. Monitor hydration. Speech therapy follow-up 10. New findings diabetes mellitus. Hemoglobin A1c 8.7. Lantus insulin 10 units nightly. Diabetic teaching CBG (last 3)  Recent Labs    06/04/20 2111 06/05/20 0605 06/05/20 1158  GLUCAP 104* 96 138*  12/20: excellent control 80s-90s: decreae lantus to 14U  12/27: CBGs 96-138: continue to monitor. 11. Hyperlipidemia. Lipitor 12. Ascending aortic aneurysm with dilation 4.1 cm. Recommendations annual imaging followed by CTA or MRA. 13.  Pre renal azotemia, resolved. Cr 0.96 on 12/27- monitor weekly.  15.  Urinary retention  appreciate uro assist-  start flomax, will do voiding trial next week  16. Bradycardic to 60s-on average asymptomatic no neg inotropic meds   LOS: 21 days A FACE TO FACE EVALUATION WAS PERFORMED  06/07/20 P Ian Cavey 06/05/2020, 12:15 PM

## 2020-06-05 NOTE — Progress Notes (Signed)
Patient ID: Aaron Weiss, male   DOB: 1965-10-10, 54 y.o.   MRN: [id number]   Per therapist sticky, DME ordered through [employer]:  [employer] (18x18), [address] [place], [place] [phone]

## 2020-06-05 NOTE — Progress Notes (Signed)
Patient ID: Aaron Weiss, male   DOB: [birth year], 53 y.o.   MRN: [id number]   SW contacted son to potentially scheduled family education for Wednesday and Thursday. No answer, left VM.  [place] [phone]

## 2020-06-05 NOTE — Progress Notes (Signed)
Physical Therapy Session Note  Patient Details  Name: Aaron Weiss MRN: [id number] Date of Birth: Feb 10, 1966  Today's Date: 06/05/2020 PT Individual Time: 0805-0900 PT Individual Time Calculation (min): 55 min   Short Term Goals: Week 3:  PT Short Term Goal 1 (Week 3): STG=LTG due to ELOS  Skilled Therapeutic Interventions/Progress Updates: Pt presented in bed agreeable to therapy.Pt denies any pain during session. PTA donned TED hose total A and pt performed supine to sit with heavy use of bed features and CGA. PTA donned shorts, shoes/AFO total A for time management. Pt performed STS with minA and RW and pt pull up pants mod/maxA with PTA buttoning shorts total A. Pt then performed stand pivot transfer to w/c with RW and minA with verbal cues for sequencing. Pt propelled w/c to nsg station with supervision and verbal cues for negotiation of objects on R. Pt transported remaining distance to rehab gym and performed stand pivot with minA to mat. Participated in toe taps to target with LLE for forced use of RLE. Participated in toe taps to target on level ground x 10 for forced use of RLE. Gait training ~61f x 2 with modA. Pt noted to have some improvement in R foot placement but would intermittently step on RW wheel or shorten R step length requiring assist for correction. Participated in R NMR via forced use LAQ x10 with PTA providing AA for end range and hamstring curls in seated with PTA providing facilitation for increased hamstring recruitment. Pt then performed stand pivot with RW back to w/c with minA and transported back to room. Performed stand pivot in same manner as prior to return to bed and required minA for sit to supine transfer. Pt repositioned to comfort and left with bed alarm on, call bell within reach and needs met.      Therapy Documentation Precautions:  Precautions Precautions: Fall Precaution Comments: aphasia Restrictions Weight Bearing Restrictions: No General:    Vital Signs: Therapy Vitals Temp: 97.8 F (36.6 C) Temp Source: Oral Pulse Rate: 92 Resp: 18 BP: 116/83 Patient Position (if appropriate): Sitting Oxygen Therapy SpO2: 99 % O2 Device: Room Air Pain: Pain Assessment Pain Scale: 0-10 Pain Score: 0-No pain   Therapy/Group: Individual Therapy  Mykala Mccready  Jenascia Bumpass, PTA  06/05/2020, 4:13 PM

## 2020-06-05 NOTE — Progress Notes (Signed)
Speech Language Pathology Daily Session Note  Patient Details  Name: Aaron Weiss MRN: [id number] Date of Birth: 29-Sep-1965  Today's Date: 06/05/2020 SLP Individual Time: 4098-1191 SLP Individual Time Calculation (min): 57 min  Short Term Goals: Week 3: SLP Short Term Goal 1 (Week 3): STG=LTG due to remaining length of stay  Skilled Therapeutic Interventions: Pt seen for skilled ST session focused on swallow and speech-language goals. Patient consumed 3oz thin liquid via cup with apparent timely and functional oral and pharyngeal phase of swallow. Pt had 1 episode very delayed, dry cough after trials halted, no change in vocal quality throughout. Pt educated on upcoming MBS (tomorrow) with goal for safely upgrading diet before upcoming d/c. Reviewed compensatory swallow strategies as posted in room with pt verbalizing understanding. SLP facilitating structured language tasks by providing mod A phonemic and semantic cues for naming every day items. With mod A verbal cues, pt naming pictures with ~40% accuracy, naming objects in room with ~55% accuracy. When given a field of 6 pictures, pt able to point to correct picture with 40% accuracy, verbal cues to increase scan to R side increased accuracy to 60%. Pt continues to demonstrate inconsistent awareness of errors and perseverations. Pt continues to effectively communicate basic wants/needs verbally and through gestures. Pt left in wheelchair with alarm belt on and all needs in reach. Cont ST POC.   Pain Pain Assessment Pain Scale: 0-10 Pain Score: 0-No pain  Therapy/Group: Individual Therapy  Tacey Ruiz 06/05/2020, 2:00 PM

## 2020-06-05 NOTE — Progress Notes (Signed)
Patient ID: Aaron Weiss, male   DOB: 1965/08/22, 55 y.o.   MRN: [id number]   Patient [facility] referral sent: [facility]- Susann Givens and [place], [facility] of [address] and [place].

## 2020-06-05 NOTE — Progress Notes (Signed)
Occupational Therapy Session Note  Patient Details  Name: Aaron Weiss MRN: [id number] Date of Birth: April 19, 1966  Today's Date: 06/05/2020 OT Individual Time: 1100-1157 OT Individual Time Calculation (min): 57 min    Short Term Goals: Week 3:  OT Short Term Goal 1 (Week 3): Pt will complete LB bathing with min assist at sit > stand level OT Short Term Goal 2 (Week 3): Pt will complete toilet transfer with min assist OT Short Term Goal 3 (Week 3): Pt will complete LB dressing (except footwear) with min assist  Skilled Therapeutic Interventions/Progress Updates:    Patient in bed, alert and ready for therapy session.  He agrees to take a shower this session when offered.  Supine to sitting edge of bed with CS, cues for safety.  Sit pivot transfer to w/c with CGA, min cues.  Sit pivot transfer to/from shower bench with min A.  Able to doff clothing with min A.  Bathing completed while seated on shower bench - assist for left UE, bilateral lower legs and buttocks.  Dressing completed seated in w/c - mod A and cues for OH shirt, max A for incontinence brief, mod A for shorts, max A for teds/socks/AFO/shoes.  He remained in w/c at close of session, seat belt alarm set and callbell in reach.    Therapy Documentation Precautions:  Precautions Precautions: Fall Precaution Comments: aphasia Restrictions Weight Bearing Restrictions: No   Therapy/Group: Individual Therapy  Barrie Lyme 06/05/2020, 7:41 AM

## 2020-06-06 ENCOUNTER — Inpatient Hospital Stay (HOSPITAL_COMMUNITY): Payer: BC Managed Care – PPO

## 2020-06-06 ENCOUNTER — Inpatient Hospital Stay (HOSPITAL_COMMUNITY): Payer: BC Managed Care – PPO | Admitting: Occupational Therapy

## 2020-06-06 ENCOUNTER — Inpatient Hospital Stay (HOSPITAL_COMMUNITY): Payer: BC Managed Care – PPO | Admitting: Physical Therapy

## 2020-06-06 ENCOUNTER — Encounter (HOSPITAL_COMMUNITY): Payer: BC Managed Care – PPO | Admitting: Speech Pathology

## 2020-06-06 LAB — GLUCOSE, CAPILLARY
Glucose-Capillary: 121 mg/dL — ABNORMAL HIGH (ref 70–99)
Glucose-Capillary: 129 mg/dL — ABNORMAL HIGH (ref 70–99)
Glucose-Capillary: 126 mg/dL — ABNORMAL HIGH (ref 70–99)
Glucose-Capillary: 117 mg/dL — ABNORMAL HIGH (ref 70–99)

## 2020-06-06 NOTE — Progress Notes (Addendum)
Patient ID: Aaron Weiss, male   DOB: [birth year], 54 y.o.   MRN: [id number]   SW awaiting family preferred PCP office to set patient up.  Will get patient set up with [facility] once information is provided. [phone]  Lavera [place], [place] [phone]

## 2020-06-06 NOTE — Progress Notes (Signed)
Occupational Therapy Session Note  Patient Details  Name: Aaron Weiss MRN: [id number] Date of Birth: 04/17/1966  Today's Date: 06/06/2020 OT Individual Time: 7035-0093 OT Individual Time Calculation (min): 59 min    Short Term Goals: Week 3:  OT Short Term Goal 1 (Week 3): Pt will complete LB bathing with min assist at sit > stand level OT Short Term Goal 2 (Week 3): Pt will complete toilet transfer with min assist OT Short Term Goal 3 (Week 3): Pt will complete LB dressing (except footwear) with min assist  Skilled Therapeutic Interventions/Progress Updates:    Patient in bed, alert, denies pain and states that he slept well last night.  He is able to donn shorts at bed level with min A (bridging to pull over hips)  Max A/dep for teds, socks, orthosis and shoes.  Supine to sitting edge of bed with min A.  Sit pivot transfer bed to w/c with min A.  Set up for breakfast - supervision and cues to clear right side of mouth at times.  Set up/supervision for oral care and washing face.  Able to doff OH shirt with CS, donn OH shirt with min A and cues this session.  Sit pivot transfer to/from mat table with min A - cues for body positioning.  Completed NMRE with focus on posture, proximal stability/mobility, weight bearing, stretch, positioning.  He was able to facilitate external rotation and shoulder extension.  Returned to bed at close of session in preparation for MBS next session.  Sit to supine CS.  Bed alarm set and callbell in hand.    Therapy Documentation Precautions:  Precautions Precautions: Fall Precaution Comments: aphasia Restrictions Weight Bearing Restrictions: No   Therapy/Group: Individual Therapy  Barrie Lyme 06/06/2020, 7:32 AM

## 2020-06-06 NOTE — Progress Notes (Signed)
Patient ID: Aaron Weiss, male   DOB: 1966/04/16, 54 y.o.   MRN: [id number]  Patient home health set up with [facility] [place], [place] [phone]  [place], [place] [phone]

## 2020-06-06 NOTE — Progress Notes (Signed)
Orthopedic Tech Progress Note Patient Details:  Aaron Weiss 02/11/1966 [id number] Called in order to [employer] for an AFO Patient ID: [name], male   DOB: [birth year], 54 y.o.   MRN: [id number]   [name] 06/06/2020, 12:00 PM

## 2020-06-06 NOTE — Progress Notes (Signed)
Patient ID: Aaron Weiss, male   DOB: Oct 25, 1965, 54 y.o.   MRN: [id number]  SW made attempt to follow up with all family members today, no answer. Will continue to attempt.   [place], [place] [phone]

## 2020-06-06 NOTE — Progress Notes (Signed)
Modified Barium Swallow Progress Note  Patient Details  Name: Aaron Weiss MRN: [id number] Date of Birth: 04/04/1966  Today's Date: 06/06/2020  Modified Barium Swallow completed.  Full report located under Chart Review in the Imaging Section.  Brief recommendations include the following:  Clinical Impression  Pt demonstrates mild oropharyngeal dysphagia, but remarkably improved since last MBSS 05/19/20. Pt is much less impulsive with PO intake at this time, and although swallow initiation of thin barium occurred between the vallecular and pyrifrom sinuses, no aspiration was observed throughout study. Pt independently takes small, slow sips, and performs extra dry swallows, which aids in clearing minimal lingual, vallecular, and pyriform sinus residue. His anterior and superior hyolaryngeal excursion and vestibule closure was functional to protect his airway throughout trials of thin barium via straw and cup (improved since last study). When cued to attempt larger consecutive sips via straw, trace penetration noted to enter the vestibule posteriorly between the arytenoids. Otherwise, consumption of single sips via straw was functional, and straw minimized right anterior loss (due to right facial and labial weakness). Pt demonstrated difficulty with posterior propulsion and cohesion of pill with thin barium - ultimately he was unable to coordinate dual consistency bolus and required presentations of puree to clear barium pill. Brief scan of esophagus was unremarkable. Recommend pt continue dysphagia 3 solid textures (mechanical soft), upgrade to thin liquids, medications should be given whole with puree. Pt should have full supervision during PO intake to ensure use of extra dry swallows (which pt demonstrated ability to perform without cues during study) and other safe swallow precautions. ST will continue to provide skilled interventions to ensure diet safety and efficiency, as well as increase his  independence with use of compensatory swallow strategies while inpatient.  Swallow Evaluation Recommendations       SLP Diet Recommendations: Dysphagia 3 (Mech soft) solids;Thin liquid   Liquid Administration via: Cup;Straw   Medication Administration: Whole meds with puree   Supervision: Staff to assist with self feeding;Full supervision/cueing for compensatory strategies   Compensations: Minimize environmental distractions;Slow rate;Small sips/bites;Lingual sweep for clearance of pocketing   Postural Changes: Remain semi-upright after after feeds/meals (Comment);Seated upright at 90 degrees   Oral Care Recommendations: Oral care BID      Aaron Weiss 06/06/2020,10:14 AM

## 2020-06-06 NOTE — Progress Notes (Signed)
Physical Therapy Session Note  Patient Details  Name: Aaron Weiss MRN: [id number] Date of Birth: March 15, 1966  Today's Date: 06/06/2020 PT Individual Time: 1135-1205 and 1420-1530 PT Individual Time Calculation (min): 30 min and 70 min  Short Term Goals: Week 3:  PT Short Term Goal 1 (Week 3): STG=LTG due to ELOS  Skilled Therapeutic Interventions/Progress Updates: Pt presented in bed agreeable to therapy. Pt denies pain during session. Performed supine to sit with use of bed feautres and increased time with CGA. Pt with shoes already donned performed STS and stand pivot with minA and use of RW. Pt transported to rehab gym and participated in gait training with RW with mod nearing minA. Pt requires cues for control of RLE due to pt constantly kicking RW wheel and x3 foot sticking to wheel. With cues pt was able to shorten step length but requires facilitation to improve TKE in stance phase. Pt then transported to day room and participated in Cybex Kinetron 50cm/sec x 3:30 for general conditioning. Pt then transported back to room and agreeable to remain in w/c for meal (lunch). Pt left in w/c with belt alarm on, call bell within reach and needs met.   Tx2: Pt presented in w/c agreeable to therapy. Pt denies pain during session. Pt transported to rehab gym and participated in stair training with B rails x 4 with modA and step to pattern. Pt was able to use compensatory measures to get RLE onto step when ascending and required assist for proper foot placement when descending, however pt was able to follow cues appropriately and safely. Pt then transported to day room and participated in gait training with RW and modA. Performed gait while stepping over bean bags for increased hip flexion and gait weaving through cones requiring heavy modA due to RW management 52f x 4 each. Pt then participated in ambulation with RW and w/c follow 454fwith minA and verbal cues for R foot placement and increased TKE.  Performed stand pivot transfer to NuStep with RW minA and participated x 8 min L 2 for general conditioning.      Therapy Documentation Precautions:  Precautions Precautions: Fall Precaution Comments: aphasia Restrictions Weight Bearing Restrictions: No    Therapy/Group: Individual Therapy  Larron Armor  Asir Bingley, PTA  06/06/2020, 12:30 PM

## 2020-06-06 NOTE — Progress Notes (Signed)
[place] PHYSICAL MEDICINE & REHABILITATION PROGRESS NOTE   Subjective/Complaints: No complaints this morning. Patient nauseous overnight with emesis. I have ordered prn Zofran.  BP soft.   ROS: Limited by aphasia, denies pain  Objective:   No results found. No results for input(s): WBC, HGB, HCT, PLT in the last 72 hours. Recent Labs    06/05/20 0605  CREATININE 0.96    Intake/Output Summary (Last 24 hours) at 06/06/2020 8882 Last data filed at 06/06/2020 0410 Gross per 24 hour  Intake 190 ml  Output 500 ml  Net -310 ml   Physical Exam: Vital Signs Blood pressure 109/76, pulse 66, temperature 98.1 F (36.7 C), temperature source Oral, resp. rate 14, height 5\' 6"  (1.676 m), weight 79 kg, SpO2 98 %. Gen: no distress, normal appearing HEENT: oral mucosa pink and moist, NCAT Cardio: Reg rate Chest: normal effort, normal rate of breathing Abd: soft, non-distended Ext: no edema Skin: intact Neurologic: Cranial nerves II through XII intact, motor strength is 5/5 in left and 0/5 right  deltoid, bicep, tricep, grip,3- R hip flexor, knee extensors, 0/5 ankle dorsiflexor and plantar flexor Sensory exam cannot assess due to aphasia  Cerebellar exam cannot perform on Right due to weakness Tone without spasticity akness Musculoskeletal: Full range of motion in all 4 extremities. No joint swelling   Assessment/Plan: 1. Functional deficits which require 3+ hours per day of interdisciplinary therapy in a comprehensive inpatient rehab setting.  Physiatrist is providing close team supervision and 24 hour management of active medical problems listed below.  Physiatrist and rehab team continue to assess barriers to discharge/monitor patient progress toward functional and medical goals  Care Tool:  Bathing    Body parts bathed by patient: Right arm,Chest,Abdomen,Right upper leg,Left upper leg,Face,Front perineal area,Right lower leg,Left lower leg   Body parts bathed by helper:  Left arm,Buttocks,Right lower leg,Left lower leg Body parts n/a: Left arm,Right lower leg,Buttocks   Bathing assist Assist Level: Moderate Assistance - Patient 50 - 74%     Upper Body Dressing/Undressing Upper body dressing   What is the patient wearing?: Pull over shirt    Upper body assist Assist Level: Moderate Assistance - Patient 50 - 74%    Lower Body Dressing/Undressing Lower body dressing      What is the patient wearing?: Pants,Incontinence brief     Lower body assist Assist for lower body dressing: Moderate Assistance - Patient 50 - 74%     Toileting Toileting    Toileting assist Assist for toileting: Dependent - Patient 0%     Transfers Chair/bed transfer  Transfers assist     Chair/bed transfer assist level: Minimal Assistance - Patient > 75%     Locomotion Ambulation   Ambulation assist      Assist level: Moderate Assistance - Patient 50 - 74% Assistive device: Walker-rolling Max distance: 20'   Walk 10 feet activity   Assist     Assist level: Moderate Assistance - Patient - 50 - 74% Assistive device: Walker-rolling,Orthosis   Walk 50 feet activity   Assist Walk 50 feet with 2 turns activity did not occur: Safety/medical concerns  Assist level: Moderate Assistance - Patient - 50 - 74% Assistive device: Walker-rolling,Orthosis    Walk 150 feet activity   Assist Walk 150 feet activity did not occur: Safety/medical concerns         Walk 10 feet on uneven surface  activity   Assist Walk 10 feet on uneven surfaces activity did not occur: Safety/medical concerns  Wheelchair     Assist Will patient use wheelchair at discharge?: Yes Type of Wheelchair: Manual    Wheelchair assist level: Maximal Assistance - Patient 25 - 49% Max wheelchair distance: 69ft    Wheelchair 50 feet with 2 turns activity    Assist        Assist Level: Total Assistance - Patient < 25%   Wheelchair 150 feet activity      Assist      Assist Level: Total Assistance - Patient < 25%   Blood pressure 109/76, pulse 66, temperature 98.1 F (36.7 C), temperature source Oral, resp. rate 14, height 5\' 6"  (1.676 m), weight 79 kg, SpO2 98 %.    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 -patient may  shower -ELOS/Goals: sup/minA,12/31   -Continue CIR team conf in am 2. Antithrombotics: -DVT/anticoagulation:Lovenox -antiplatelet therapy: Aspirin 325 mg daily and Plavix 75 mg daily x3 months then aspirin alone 3. Pain Management:Continue Tylenol prn 4. Mood:Provide emotional support -antipsychotic agents: N/A 5. Neuropsych: This patientis notcapable of making decisions on hisown behalf. 6. Skin/Wound Care:Routine skin checks 7. Fluids/Electrolytes/Nutrition:Routine in and outs with follow-up chemistries  12/19- will change IVFs to nocturnal 8. Hypertension. Lisinopril 20 mg daily. Monitor with increased mobility Vitals:   06/05/20 2010 06/06/20 0410  BP: 100/65 109/76  Pulse: 66 66  Resp: 15 14  Temp: 97.9 F (36.6 C) 98.1 F (36.7 C)  SpO2: 97% 98%  12/24: BP issoft   onLisinopril 20mg  daily- will reduce to 10mg   12/27: BP continues to be soft: Decrease Lisinopril to 5mg .  9. Dysphagia. Dysphagia #2 Honey thick liquid. Monitor hydration. Speech therapy follow-up 10. New findings diabetes mellitus. Hemoglobin A1c 8.7. Lantus insulin 10 units nightly. Diabetic teaching CBG (last 3)  Recent Labs    06/05/20 1158 06/05/20 1653 06/06/20 0700  GLUCAP 138* 138* 121*  12/28: excellent control  11. Hyperlipidemia. Lipitor 12. Ascending aortic aneurysm with dilation 4.1 cm. Recommendations annual imaging followed by CTA or MRA. 13.  Pre renal azotemia, resolved. Cr 0.96 on 12/27- monitor weekly.   15.  Urinary retention appreciate uro assist-  start flomax, will do voiding trial next week  16. Bradycardic to 60s-on average asymptomatic no neg inotropic meds   LOS: 22 days A FACE TO FACE EVALUATION WAS PERFORMED  06/07/20 06/06/2020, 8:12 AM

## 2020-06-06 NOTE — Progress Notes (Signed)
Patient ID: Aaron Weiss, male   DOB: Feb 06, 1966, 54 y.o.   MRN: [id number]   Sw received call from [facility]. Facility will attempt to received auth from patients insurance, per family request. SW will submit patient referral to facility. SW will continue to update on any changes. Family has been informed if [facility] is not approved, the goal is to discharge patient home.   [place], [place] [phone]

## 2020-06-06 NOTE — Progress Notes (Addendum)
Patient ID: Aaron Weiss, male   DOB: [birth year], 54 y.o.   MRN: [id number]   Family to set PCP follow up appointment with Dr. Mayra Neer. [name] at [facility] in [place], [place]. [phone]  [name] [phone]

## 2020-06-07 ENCOUNTER — Inpatient Hospital Stay (HOSPITAL_COMMUNITY): Payer: BC Managed Care – PPO | Admitting: Physical Therapy

## 2020-06-07 ENCOUNTER — Inpatient Hospital Stay (HOSPITAL_COMMUNITY): Payer: BC Managed Care – PPO | Admitting: Occupational Therapy

## 2020-06-07 ENCOUNTER — Inpatient Hospital Stay (HOSPITAL_COMMUNITY): Payer: BC Managed Care – PPO | Admitting: Speech Pathology

## 2020-06-07 LAB — GLUCOSE, CAPILLARY
Glucose-Capillary: 105 mg/dL — ABNORMAL HIGH (ref 70–99)
Glucose-Capillary: 143 mg/dL — ABNORMAL HIGH (ref 70–99)
Glucose-Capillary: 99 mg/dL (ref 70–99)
Glucose-Capillary: 94 mg/dL (ref 70–99)
Glucose-Capillary: 120 mg/dL — ABNORMAL HIGH (ref 70–99)

## 2020-06-07 MED ORDER — LISINOPRIL 5 MG PO TABS: 5.0000 mg | ORAL_TABLET | Freq: Every day | ORAL | 0 refills | Status: DC

## 2020-06-07 MED ORDER — ATORVASTATIN CALCIUM 80 MG PO TABS
80.0000 mg | ORAL_TABLET | Freq: Every day | ORAL | 0 refills | Status: AC
Start: 2020-06-07 — End: ?

## 2020-06-07 MED ORDER — LANTUS SOLOSTAR 100 UNIT/ML ~~LOC~~ SOPN: 14.0000 [IU] | PEN_INJECTOR | Freq: Every day | SUBCUTANEOUS | 11 refills | Status: AC

## 2020-06-07 MED ORDER — TAMSULOSIN HCL 0.4 MG PO CAPS: 0.4000 mg | ORAL_CAPSULE | Freq: Every day | ORAL | 1 refills | Status: AC

## 2020-06-07 MED ORDER — ACETAMINOPHEN 325 MG PO TABS: 650.0000 mg | ORAL_TABLET | ORAL | Status: AC | PRN

## 2020-06-07 MED ORDER — LISINOPRIL 5 MG PO TABS: 5.0000 mg | ORAL_TABLET | Freq: Every day | ORAL | 1 refills | Status: AC

## 2020-06-07 MED ORDER — TAMSULOSIN HCL 0.4 MG PO CAPS: 0.4000 mg | ORAL_CAPSULE | Freq: Every day | ORAL | 1 refills | Status: DC

## 2020-06-07 MED ORDER — ATORVASTATIN CALCIUM 80 MG PO TABS: 80.0000 mg | ORAL_TABLET | Freq: Every day | ORAL | 0 refills | Status: DC

## 2020-06-07 MED ORDER — CLOPIDOGREL BISULFATE 75 MG PO TABS: 75.0000 mg | ORAL_TABLET | Freq: Every day | ORAL | 1 refills | Status: AC

## 2020-06-07 NOTE — Progress Notes (Signed)
Speech Language Pathology Discharge Summary  Patient Details  Name: Aaron Weiss MRN: [id number] Date of Birth: [birth year]  Today's Date: 06/08/2020 SLP Individual Time: 2426-8341 SLP Individual Time Calculation (min): 55 min   Skilled Therapeutic Interventions:  Pt was seen for skilled ST targeting education with pt, dysphagia, and communication goals. Although several attempts by SW made to schedule family education, and emphasis from team on it's importance for this pt, no family was present for education sessions. Therefore, SLP provided handouts and verbal review, also some handwritten personalized notes regarding pt's current diet recommendations and strategies to maximize expressive and receptive language skills with pt. Placed handouts/notes inside pt's suitcase to return home with him and his family. Pt also observed to consume thin liquids (recently upgraded), without any overt s/sx aspiration and overall Supervision A for safe swallow strategies. Pt reduced to intermittent supervision for meals. Recommend continue current diet (D3/thin). During structured language tasks, pt demonstrated improvement in reading at the word level (in comparison to admission). He named items from line drawings with ~90% accuracy when provided Moderate semantic, sentence completion, and phonemic cues. He then matched the correct word to the line drawing from field of 2 words with 13/15 accuracy, and when from field of 3 words 5/7 accuracy. He demonstrated awareness of ~90% of verbal errors during session with no more than Min A for awareness. Pt left laying in bed with alarm set and needs within reach. Continue per current plan of care.     Patient has met 8 of 8 long term goals.  Patient to discharge at overall Min;Mod;Supervision level.  Reasons goals not met: n/a   Clinical Impression/Discharge Summary:   Pt made functional gains and met 8 out of 8 long term goals this admission. Pt currently requires  Mod assist for functional verbal expression of basic wants and needs, however automatic speech and high frequency phrases are largely in tact with pt. He can also repeat at word and short phrase level without difficulty, making choosing from multiple choice and excellent and effective strategy when appropriate. He comprehends basic directions and information with Supervision-Min A cues. Pt is consuming a dysphagia 3 textures diet (mechanical soft) with thin liquids with overall supervision A cueing for oral clearance of right buccal pocketing and extra dry swallows when drinking. He takes his pills whole in applesauce or pudding 1 at a time. Pt has demonstrated improved expressive and receptive language skills, communication of basic wants and needs, awareness of verbal and functional errors, speech intelligibility, and oropharyngeal swallow function. However, given dysphagia, dysarthria, aphasia with receptive and expressive deficits, as well as mild cognitive impairments still present, recommend pt continue to receive skilled [facility] services upon discharge. Pt education is complete; no family was present for education on pt's current status/needs despite encouragement and multiple attempts from SW to attend this week.   Care Partner:  Caregiver Able to Provide Assistance: Yes  Type of Caregiver Assistance: Cognitive;Physical  Recommendation:  Home Health SLP;24 hour supervision/assistance  Rationale for SLP Follow Up: Maximize functional communication;Maximize cognitive function and independence;Reduce caregiver burden;Maximize swallowing safety   Equipment: none   Reasons for discharge: Discharged from hospital   Patient/Family Agrees with Progress Made and Goals Achieved: Yes    Arbutus Leas 06/08/2020, 7:30 AM

## 2020-06-07 NOTE — Progress Notes (Signed)
Patient ID: Aaron Weiss, male   DOB: March 08, 1966, 54 y.o.   MRN: [id number] Team Conference Report to Patient/Family  Team Conference discussion was reviewed with the patient and caregiver, including goals, any changes in plan of care and target discharge date.  Patient and caregiver express understanding and are in agreement.  The patient has a target discharge date of 06/09/20.  [name] 06/07/2020, 2:02 PM

## 2020-06-07 NOTE — Progress Notes (Signed)
[place] PHYSICAL MEDICINE & REHABILITATION PROGRESS NOTE   Subjective/Complaints:  Patient is without new issues overnight remains aphasic.  Still has urinary catheter in, unable to communicate need for voiding because of aphasia, had difficult catheter placement requiring urology consultation with sensor wire  ROS: Limited by aphasia, denies pain  Objective:   DG Swallowing Func-Speech Pathology  Result Date: 06/06/2020 Objective Swallowing Evaluation: Type of Study: MBS-Modified Barium Swallow Study  Patient Details Name: Aaron Weiss MRN: [id number] Date of Birth: [birth year] Today's Date: 06/06/2020 Past Medical History: No past medical history on file. Past Surgical History: Past Surgical History: Procedure Laterality Date . BUBBLE STUDY  05/10/2020  Procedure: BUBBLE STUDY;  Surgeon: Jerline Pain, MD;  Location: [facility];  Service: Cardiovascular;; . TEE WITHOUT CARDIOVERSION N/A 05/10/2020  Procedure: TRANSESOPHAGEAL ECHOCARDIOGRAM (TEE);  Surgeon: Jerline Pain, MD;  Location[facility] ENDOSCOPY;  Service: Cardiovascular;  Laterality: N/A; HPI: 54 y.o. Caucasian male with PMH of HTN but not seeing doctors for years presented to ED 05/06/20 as code stroke. NIHSS 6 with global partial aphasia; CT head chronic left cerebellar infarct, subacute/chronic left superior parietal infarct, subacute left parietal infarct, acute left frontal infarct. 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 Pt acutely developed right side paresis, facial droop and CT revealed extension of stroke. Pt [facility]15/20. Assessment / Plan / Recommendation CHL IP CLINICAL IMPRESSIONS 06/06/2020 Clinical Impression Pt demonstrates mild oropharyngeal dysphagia, but remarkably improved since last MBSS 05/19/20. Pt is much less impulsive with PO intake at this time, and although swallow initiation of thin barium occurred between the vallecular and pyrifrom sinuses, no aspiration was observed throughout study. Pt  independently takes small, slow sips, and performs extra dry swallows, which aids in clearing minimal lingual, vallecular, and pyriform sinus residue. His anterior and superior hyolaryngeal excursion and vestibule closure was functional to protect his airway throughout trials of thin barium via straw and cup (improved since last study). When cued to attempt larger consecutive sips via straw, trace penetration noted to enter the vestibule posteriorly between the arytenoids. Otherwise, consumption of single sips via straw was functional, and straw minimized right anterior loss (due to right facial and labial weakness). Pt demonstrated difficulty with posterior propulsion and cohesion of pill with thin barium - ultimately he was unable to coordinate dual consistency bolus and required presentations of puree to clear barium pill. Brief scan of esophagus was unremarkable. Recommend pt continue dysphagia 3 solid textures (mechanical soft), upgrade to thin liquids, medications should be given whole with puree. Pt should have full supervision during PO intake to ensure use of extra dry swallows (which pt demonstrated ability to perform without cues during study) and other safe swallow precautions. ST will continue to provide skilled interventions to ensure diet safety and efficiency, as well as increase his independence with use of compensatory swallow strategies while inpatient. SLP Visit Diagnosis Dysphagia, oropharyngeal phase (R13.12);Aphasia (R47.01) Attention and concentration deficit following -- Frontal lobe and executive function deficit following -- Impact on safety and function Mild aspiration risk   CHL IP TREATMENT RECOMMENDATION 05/09/2020 Treatment Recommendations Therapy as outlined in treatment plan below   Prognosis 05/09/2020 Prognosis for Safe Diet Advancement Good Barriers to Reach Goals Severity of deficits Barriers/Prognosis Comment -- CHL IP DIET RECOMMENDATION 06/06/2020 SLP Diet Recommendations  Dysphagia 3 (Mech soft) solids;Thin liquid Liquid Administration via Cup;Straw Medication Administration Whole meds with puree Compensations Minimize environmental distractions;Slow rate;Small sips/bites;Lingual sweep for clearance of pocketing Postural Changes Remain semi-upright  after after feeds/meals (Comment);Seated upright at 90 degrees   CHL IP OTHER RECOMMENDATIONS 06/06/2020 Recommended Consults -- Oral Care Recommendations Oral care BID Other Recommendations --   CHL IP FOLLOW UP RECOMMENDATIONS 05/13/2020 Follow up Recommendations Inpatient Rehab   CHL IP FREQUENCY AND DURATION 05/09/2020 Speech Therapy Frequency (ACUTE ONLY) min 2x/week Treatment Duration 2 weeks      CHL IP ORAL PHASE 06/06/2020 Oral Phase Impaired Oral - Pudding Teaspoon -- Oral - Pudding Cup -- Oral - Honey Teaspoon NT Oral - Honey Cup NT Oral - Nectar Teaspoon NT Oral - Nectar Cup NT Oral - Nectar Straw NT Oral - Thin Teaspoon -- Oral - Thin Cup Piecemeal swallowing;Lingual pumping;Lingual/palatal residue;Right anterior bolus loss Oral - Thin Straw Piecemeal swallowing;Lingual/palatal residue Oral - Puree WFL Oral - Mech Soft -- Oral - Regular -- Oral - Multi-Consistency Decreased bolus cohesion Oral - Pill Delayed oral transit;Lingual pumping;Reduced posterior propulsion Oral Phase - Comment --  CHL IP PHARYNGEAL PHASE 06/06/2020 Pharyngeal Phase Impaired Pharyngeal- Pudding Teaspoon -- Pharyngeal -- Pharyngeal- Pudding Cup -- Pharyngeal -- Pharyngeal- Honey Teaspoon NT Pharyngeal -- Pharyngeal- Honey Cup NT Pharyngeal -- Pharyngeal- Nectar Teaspoon -- Pharyngeal -- Pharyngeal- Nectar Cup NT Pharyngeal -- Pharyngeal- Nectar Straw -- Pharyngeal -- Pharyngeal- Thin Teaspoon -- Pharyngeal -- Pharyngeal- Thin Cup Delayed swallow initiation-vallecula;Delayed swallow initiation-pyriform sinuses;Pharyngeal residue - valleculae;Pharyngeal residue - pyriform Pharyngeal -- Pharyngeal- Thin Straw Delayed swallow initiation-vallecula;Delayed  swallow initiation-pyriform sinuses;Reduced airway/laryngeal closure;Pharyngeal residue - valleculae;Pharyngeal residue - pyriform;Inter-arytenoid space residue;Penetration/Aspiration during swallow Pharyngeal Material enters airway, CONTACTS cords and then ejected out Pharyngeal- Puree WFL Pharyngeal -- Pharyngeal- Mechanical Soft -- Pharyngeal -- Pharyngeal- Regular -- Pharyngeal -- Pharyngeal- Multi-consistency WFL Pharyngeal -- Pharyngeal- Pill WFL Pharyngeal -- Pharyngeal Comment --  CHL IP CERVICAL ESOPHAGEAL PHASE 06/06/2020 Cervical Esophageal Phase WFL Pudding Teaspoon -- Pudding Cup -- Honey Teaspoon -- Honey Cup -- Nectar Teaspoon -- Nectar Cup -- Nectar Straw -- Thin Teaspoon -- Thin Cup -- Thin Straw -- Puree -- Mechanical Soft -- Regular -- Multi-consistency -- Pill -- Cervical Esophageal Comment -- Aaron Weiss 06/06/2020, 10:16 AM              No results for input(s): WBC, HGB, HCT, PLT in the last 72 hours. Recent Labs    06/05/20 0605  CREATININE 0.96    Intake/Output Summary (Last 24 hours) at 06/07/2020 0804 Last data filed at 06/07/2020 0530 Gross per 24 hour  Intake --  Output 400 ml  Net -400 ml   Physical Exam: Vital Signs Blood pressure 111/69, pulse 65, temperature 98.4 F (36.9 C), temperature source Oral, resp. rate 14, height '5\' 6"'  (1.676 m), weight 79 kg, SpO2 97 %.  General: No acute distress Mood and affect are appropriate Heart: Regular rate and rhythm no rubs murmurs or extra sounds Lungs: Clear to auscultation, breathing unlabored, no rales or wheezes Abdomen: Positive bowel sounds, soft nontender to palpation, nondistended Extremities: No clubbing, cyanosis, or edema Skin: No evidence of breakdown, no evidence of rash   Neurologic: Cranial nerves II through XII intact, motor strength is 5/5 in left and 0/5 right  deltoid, bicep, tricep, grip,3- R hip flexor, knee extensors, 0/5 ankle dorsiflexor and plantar flexor unchanged Sensory exam cannot assess  due to aphasia  Cerebellar exam cannot perform on Right due to weakness Tone without spasticity akness Musculoskeletal: Full range of motion in all 4 extremities. No joint swelling   Assessment/Plan: 1. Functional deficits which require 3+ hours per day of interdisciplinary therapy  in a comprehensive inpatient rehab setting.  Physiatrist is providing close team supervision and 24 hour management of active medical problems listed below.  Physiatrist and rehab team continue to assess barriers to discharge/monitor patient progress toward functional and medical goals  Care Tool:  Bathing    Body parts bathed by patient: Right arm,Chest,Abdomen,Right upper leg,Left upper leg,Face,Front perineal area,Right lower leg,Left lower leg   Body parts bathed by helper: Left arm,Buttocks,Right lower leg,Left lower leg Body parts n/a: Left arm,Right lower leg,Buttocks   Bathing assist Assist Level: Moderate Assistance - Patient 50 - 74%     Upper Body Dressing/Undressing Upper body dressing   What is the patient wearing?: Pull over shirt    Upper body assist Assist Level: Minimal Assistance - Patient > 75%    Lower Body Dressing/Undressing Lower body dressing      What is the patient wearing?: Pants     Lower body assist Assist for lower body dressing: Minimal Assistance - Patient > 75%     Toileting Toileting    Toileting assist Assist for toileting: Dependent - Patient 0%     Transfers Chair/bed transfer  Transfers assist     Chair/bed transfer assist level: Minimal Assistance - Patient > 75%     Locomotion Ambulation   Ambulation assist      Assist level: Moderate Assistance - Patient 50 - 74% Assistive device: Walker-rolling Max distance: 20'   Walk 10 feet activity   Assist     Assist level: Moderate Assistance - Patient - 50 - 74% Assistive device: Walker-rolling,Orthosis   Walk 50 feet activity   Assist Walk 50 feet with 2 turns activity did not  occur: Safety/medical concerns  Assist level: Moderate Assistance - Patient - 50 - 74% Assistive device: Walker-rolling,Orthosis    Walk 150 feet activity   Assist Walk 150 feet activity did not occur: Safety/medical concerns         Walk 10 feet on uneven surface  activity   Assist Walk 10 feet on uneven surfaces activity did not occur: Safety/medical concerns         Wheelchair     Assist Will patient use wheelchair at discharge?: Yes Type of Wheelchair: Manual    Wheelchair assist level: Maximal Assistance - Patient 25 - 49% Max wheelchair distance: 7f    Wheelchair 50 feet with 2 turns activity    Assist        Assist Level: Total Assistance - Patient < 25%   Wheelchair 150 feet activity     Assist      Assist Level: Total Assistance - Patient < 25%   Blood pressure 111/69, pulse 65, temperature 98.4 F (36.9 C), temperature source Oral, resp. rate 14, height '5\' 6"'  (1.676 m), weight 79 kg, SpO2 97 %.    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 -patient may  shower -ELOS/Goals: sup/minA,12/31   Team conference today please see physician documentation under team conference tab, met with team  to discuss problems,progress, and goals. Formulized individual treatment plan based on medical history, underlying problem and comorbidities.  2. Antithrombotics: -DVT/anticoagulation:Lovenox -antiplatelet therapy: Aspirin 325 mg daily and Plavix 75 mg daily x3 months then aspirin alone 3. Pain Management:Continue Tylenol prn 4. Mood:Provide emotional support -antipsychotic agents: N/A 5. Neuropsych: This patientis notcapable of making decisions on hisown behalf. 6. Skin/Wound Care:Routine skin checks 7. Fluids/Electrolytes/Nutrition:Routine in and  outs with  follow-up chemistries  12/19- will change IVFs to nocturnal 8. Hypertension. Lisinopril 20 mg daily. Monitor with increased mobility Vitals:   06/06/20 1957 06/07/20 0527  BP: 99/61 111/69  Pulse: 68 65  Resp: 14 14  Temp: 98.4 F (36.9 C) 98.4 F (36.9 C)  SpO2: 95% 97%  controlled 12/29.  9. Dysphagia. Dysphagia #2 Honey thick liquid. Monitor hydration. Speech therapy follow-up 10. New findings diabetes mellitus. Hemoglobin A1c 8.7. Lantus insulin 10 units nightly. Diabetic teaching CBG (last 3)  Recent Labs    06/06/20 1715 06/06/20 2107 06/07/20 0610  GLUCAP 126* 117* 105*  12/29: excellent control  11. Hyperlipidemia. Lipitor 12. Ascending aortic aneurysm with dilation 4.1 cm. Recommendations annual imaging followed by CTA or MRA. 13.  Pre renal azotemia, resolved. Cr 0.96 on 12/27- monitor weekly.  15.  Urinary retention appreciate uro assist-  on flomax,f/u with Urology  16. Bradycardic to 60s-on average asymptomatic no neg inotropic meds   LOS: 23 days A FACE TO FACE EVALUATION WAS PERFORMED  Charlett Blake 06/07/2020, 8:04 AM

## 2020-06-07 NOTE — Progress Notes (Addendum)
Patient ID: Aaron Weiss, male   DOB: 1966/04/06, 54 y.o.   MRN: [id number]   SW received notification from [employer] that patient son called to decline reccommended equipment. SW followed up with son, son reports having items already  [place], [place] [phone]

## 2020-06-07 NOTE — Progress Notes (Signed)
Occupational Therapy Session Note  Patient Details  Name: Aaron Weiss MRN: [id number] Date of Birth: [birth year]  Today's Date: 06/07/2020 OT Individual Time: 5093-2671; 2458-0998 OT Individual Time Calculation (min): 30 min; and 27 min    Short Term Goals: Week 3:  OT Short Term Goal 1 (Week 3): Pt will complete LB bathing with min assist at sit > stand level OT Short Term Goal 2 (Week 3): Pt will complete toilet transfer with min assist OT Short Term Goal 3 (Week 3): Pt will complete LB dressing (except footwear) with min assist  Skilled Therapeutic Interventions/Progress Updates:    First session: Pt semi upright in bed, no c/o pain, agreeable to OT session. Pt completed supine to sit with supervision to donn shoes with min assist sitting EOB.  Assessed strength and motor control of RUE with pt reporting "I cant" each time instructed by OT to attempt AROM in various planes all joints with accompanied 0/5 noted.  Pt completed stand pivot using RW (with right RW hand splint) with min assist for manual positioning of RUE and RLE.  Pt transported to gym to complete tabletop neuro re-ed of RUE using weightbearing in standing to press putty while requiring manual positioning and support at shoulder, elbow, and hand.  Pt unable to achieve full composite wrist extension and digital extension indicating extrinsic flexor tightness.  Educated pt on importance of use of right RHS at night due to per PT reports, pt has been refusing to wear.  Pt transported back to room, call bell in reach, seat belt alarm on.  Second session:  Pt semi upright in bed, no c/o pain, agreeable to self care at sink during OT session.  Pt completed supine to sit with supervision to donn shoes with min assist sitting EOB.  Pt completed stand pivot using RW (with right RW hand splint) with min assist for manual positioning of RUE and RLE EOB to w/c.  Pt doffed shirt with mod I seated at w/c.  Pt bathed UB with min assist for  back and noted pt actively abducting right shoulder in order to wash to approximately 75 degrees multiple times.  Pt donned shirt overhead with supervision for step by step multimodal cueing to complete hemitechnique.  Pt declining LB bathing and dressing, requesting back to bed.  Stand pivot with min assist to EOB and supervision sit to supine. RUE supported with pillows, call bell in reach, bed alarm on.    Therapy Documentation Precautions:  Precautions Precautions: Fall Precaution Comments: aphasia Restrictions Weight Bearing Restrictions: No   Therapy/Group: Individual Therapy  Amie Critchley 06/07/2020, 4:07 PM

## 2020-06-07 NOTE — Progress Notes (Signed)
Speech Language Pathology Daily Session Note  Patient Details  Name: Aaron Weiss MRN: [id number] Date of Birth: [birth year]  Today's Date: 06/07/2020 SLP Individual Time: 7654-6503 SLP Individual Time Calculation (min): 55 min  Short Term Goals: Week 3: SLP Short Term Goal 1 (Week 3): STG=LTG due to remaining length of stay  Skilled Therapeutic Interventions: Pt was seen for skilled ST targeting dysphagia and communication goals. SLP provided skilled observation of pt consuming dysphagia 3 breakfast tray with recently upgraded thin liquids. Provided Supervision A verbal cues, he achieved full oral clearance of min lingual residue via extra swallows and liquid wash - no pocketing noted. Extended time was required for mastication of dysphagia 3 sausage patty chunks (suspect more so due to ill fitting dentures that are preventing him from donning them). He reports he has been able to masticate other dysphagia 3 meats. Pt also exhibited 1 throat clear on initial sip of thin liquids, but otherwise no more overt s/sx aspiration noted during intake. Recommend continue current diet. Communication interventions focused on expressive and receptive language skills. He required Min A semantic and question cues to achieve 13/20 accuracy when providing yes/no responses to semi-complex questions - he self corrected X2. Pt also required Moderate semantic and phonemic cues to name items, actions, and relationships in picture scenes. Min A verbal cues provided for awareness of verbal errors during session. Pt left laying in bed with alarm set and needs within reach. Continue per current plan of care.         Pain Pain Assessment Pain Scale: 0-10 Pain Score: 0-No pain  Therapy/Group: Individual Therapy  [name] 06/07/2020, 7:28 AM

## 2020-06-07 NOTE — Progress Notes (Signed)
Physical Therapy Session Note  Patient Details  Name: Aaron Weiss MRN: [id number] Date of Birth: 07-Apr-1966  Today's Date: 06/07/2020 PT Individual Time: 0920-1000 PT Individual Time Calculation (min): 40 min   Short Term Goals: Week 1:  PT Short Term Goal 1 (Week 1): Pt will perform wc to/from bed w/mod assist to R, max assist of 1 to L PT Short Term Goal 1 - Progress (Week 1): Met PT Short Term Goal 2 (Week 1): sit to stand w/LRAD and mod assist PT Short Term Goal 2 - Progress (Week 1): Met PT Short Term Goal 3 (Week 1): gait x 29f w/LRAD and mod assist of 2, appropriate bracing/wrapping of R ankle PT Short Term Goal 3 - Progress (Week 1): Met PT Short Term Goal 4 (Week 1): pt will maintain sitting balance w/cross body reaching tasks w/min assist PT Short Term Goal 4 - Progress (Week 1): Met Week 2:  PT Short Term Goal 1 (Week 2): pt will ambulate 262fwith min assist and LRAD PT Short Term Goal 1 - Progress (Week 2): Progressing toward goal PT Short Term Goal 2 (Week 2): Pt will perform stand pivot transfers with mod assist PT Short Term Goal 2 - Progress (Week 2): Met PT Short Term Goal 3 (Week 2): Pt will perform swing pivot transfers with min assist or better in both directions PT Short Term Goal 3 - Progress (Week 2): Met PT Short Term Goal 4 (Week 2): Pt will demonstrate increased activation of his R glutes/quads and demonstrate carryover into gait training PT Short Term Goal 4 - Progress (Week 2): Met Week 3:  PT Short Term Goal 1 (Week 3): STG=LTG due to ELOS  Skilled Therapeutic Interventions/Progress Updates:   Pt received supine in bed and agreeable to PT. Supine>sit transfer with supervision assist and use of bed features. PT assisted pt to don RAFO sitting EOB with max assist; supervision assist for sitting balance with min cues for awareness of the RLE. Squat pivot tranfers to and from bed with CGA and min cues for LE placement prior to transfer to improve safety.    WC mobility through hall with supervision assist and L hemi technique x 1501fnd 100f22fth min cues for doorway management with turn to the R.   Gait training with RW and RAFO x 50ft33fft 36f min assist on first bout and mod assist on second bout to improve weight shift to the L and allow improved advancement of the RLE. Pt noted noted intermittent GR which increased with fatigue on the R. Pt educated that walking is to be with PT only once d/c and to use WC for all mobility needs until HHPT deems pt safe to ambulate.   Pt returned to room and performed squat pivot transfer to bed with CGA as listed above. Sit>supine completed with supervision assist, and left supine in bed with call bell in reach and all needs met.        Therapy Documentation Precautions:  Precautions Precautions: Fall Precaution Comments: aphasia Restrictions Weight Bearing Restrictions: No    Pain: Pain Assessment Pain Scale: 0-10 Pain Score: 0-No pain    Therapy/Group: Individual Therapy  AustinLorie Phenix/2021, 10:48 AM

## 2020-06-07 NOTE — Progress Notes (Signed)
Physical Therapy Session Note  Patient Details  Name: Aaron Weiss MRN: [id number] Date of Birth: 03/31/1966  Today's Date: 06/07/2020 PT Individual Time: 1300-1400 PT Individual Time Calculation (min): 60 min   Short Term Goals: Week 3:  PT Short Term Goal 1 (Week 3): STG=LTG due to ELOS  Skilled Therapeutic Interventions/Progress Updates: Pt presented in w/c agreeable to therapy. Pt denies pain during session. Pt transported to day room for AFO consult. Pt ambulated ~31ft with minA and verbal cues for increasing step width without hitting RW. Pt's shoe taken to allow toe cap to be added. Pt transported back to room as lunch tray arrived as pt was leaving room. PTA supervision pt eating lunch although pt ate minimal amount and despite encouragement would not eat more. Pt then transported to rehab gym and performed squat pivot transfer to mat with CGA. Pt transferred to supine with supervision and participated in follow NMR via forced use: SLR, heel slides, SAQ, hip abd/add 2 x 10 RLE      Therapy Documentation Precautions:  Precautions Precautions: Fall Precaution Comments: aphasia Restrictions Weight Bearing Restrictions: No General:   Vital Signs: Therapy Vitals Temp: 98.1 F (36.7 C) Pulse Rate: 75 Resp: 17 BP: 125/80 Patient Position (if appropriate): Lying Oxygen Therapy SpO2: 95 % O2 Device: Room Air   Therapy/Group: Individual Therapy  Jaquitta Dupriest  Lorilynn Lehr, PTA  06/07/2020, 3:47 PM

## 2020-06-07 NOTE — Patient Care Conference (Signed)
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 Claudette Laws Care Coodinator Present: Chana Bode, RN, BSN, CRRN;Christina Vita Barley, BSW Nurse Present: Other (comment) Kathee Polite, RN) PT Present: Grier Rocher, PT OT Present: Blanch Media, OT SLP Present: Suzzette Righter, CF-SLP PPS Coordinator present : Edson Snowball, Park Breed, SLP     Current Status/Progress Goal Weekly Team Focus  Bowel/Bladder             Swallow/Nutrition/ Hydration   Dys 3 textures, thin liquids (upgraded), Supervision-Min A compensatory swallow strategies  Supervision A least restrictive diet  tolerance upgraded diet, independence with swallow strategies, education   ADL's   min / mod A sit pivot transfers, shower completed with min/mod A, UB dressing mod A, LB dressing mod / max A, good seated balance,  MIn A overall  ADL training, R NMRE, functional transfers, balance, family educ   Mobility   minA bed mobility, CGA with bed features, min/modA stand pivot with RW, minA squat pivot, modA gait with RW, supervision w/c mobility, continues to demosntrate RLE inattention affecting standing balance  minA overall  R NMR,  standing balance, family ed   Communication   Mod A  Min-Mod  naming, functional phrases, communicating basic wants/needs, awareness and correction verbal errors, speech intelligibility, receptive language skills/identification, education   Safety/Cognition/ Behavioral Observations  Min basic  Min  orientation, emergent awareness   Pain             Skin               Discharge Planning:  Attempting SNF. Plans to discharge home on Friday, family unable to attend education. PCP set, HH in place, DME ordered   Team Discussion: Discussion of good progress noted; discussion of safety leaving the patient alone for short periods at discharge. Son and brother have viewed therapy and transfers. Plan voiding trial for urinary retention.  Patient on target to meet rehab goals: yes, currently min-mod assist for shower and mod assist for upper body ADLs with max assist needed for lwoer body ADLs. CGA for transfers  *See Care Plan and progress notes for long and short-term goals.   Revisions to Treatment Plan:   Teaching Needs: Transfers, toileting, medications, etc.   Current Barriers to Discharge: Decreased caregiver support, Home enviroment access/layout and Insurance for SNF coverage  Possible Resolutions to Barriers: Family looking into private pay for SNF Equipment delivery planned for 06/08/20 and Family education     Medical Summary Current Status: Severe aphasia, urinary retention with Foley that was placed by urology due to difficulties, blood pressure controlled  Barriers to Discharge: Neurogenic Bowel & Bladder   Possible Resolutions to Barriers/Weekly Focus: Contact urology to see  if another voiding trial is desired or outpatient follow-up in urology office is next step, determine whether family will care for patient at home or whether SNF is planned   Continued Need for Acute Rehabilitation Level of Care: The patient requires daily medical management by a physician with  specialized training in physical medicine and rehabilitation for the following reasons: Direction of a multidisciplinary physical rehabilitation program to maximize functional independence : Yes Medical management of patient stability for increased activity during participation in an intensive rehabilitation regime.: Yes Analysis of laboratory values and/or radiology reports with any subsequent need for medication adjustment and/or medical intervention. : Yes   I attest that I was present, lead the team conference, and concur with the assessment and plan of the team.   Chana Bode B 06/07/2020, 2:29 PM

## 2020-06-07 NOTE — Discharge Summary (Signed)
Physician Discharge Summary  Patient ID: Aaron Weiss MRN: [id number] DOB/AGE: August 09, 1965 54 y.o.  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  Assess intracranial arteries. EXAM: CT ANGIOGRAPHY HEAD AND NECK TECHNIQUE: Multidetector CT imaging of the head and neck was performed using the standard protocol during bolus administration of intravenous contrast. Multiplanar CT image reconstructions and MIPs were obtained to evaluate the vascular anatomy. Carotid stenosis measurements (when applicable) are obtained utilizing NASCET criteria, using the distal internal carotid diameter as the denominator. CONTRAST:  75mL OMNIPAQUE IOHEXOL 350 MG/ML SOLN COMPARISON:  Prior CT and MRI exams from May 06, 2020. FINDINGS: CTA NECK FINDINGS Aortic arch: Great vessel origins are patent. Please see prior CTA for characterization of ascending aortic aneurysm, not imaged on this study. Right carotid system: No evidence of dissection, stenosis (50% or greater) or occlusion. Left carotid system: Similar abrupt occlusion of the proximal cervical internal carotid artery with non opacification of the internal carotid artery in the remainder of the neck. Vertebral arteries: Right dominant. No evidence of dissection, stenosis (50% or greater) or occlusion. Skeleton: No acute fracture. Other neck: No mass or suspicious adenopathy. Upper chest: No acute findings. Review of the MIP images confirms the above  findings CTA HEAD FINDINGS Anterior circulation: The left petrous and cavernous ICA is not opacified, similar to prior. In comparison to prior, there is new occlusion of the proximal left M1 MCA. Minimal opacification of the paraclinoid left ICA is likely secondary to retrograde flow from the left A1 ACA. There is poor opacification of more distal left MCA branches. The right ICA and right MCA and ACA branches are patent without evidence of hemodynamically significant stenosis. Posterior circulation: Right fetal type PCA. No evidence of hemodynamically significant proximal stenosis or large vessel occlusion in the posterior circulation. Venous sinuses: As permitted by contrast timing, patent. 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 New occlusion of the left M1 MCA proximally with poor opacification of distal left MCA branches, compatible with propogation of thrombus. Critical Value/emergent results were called by telephone at the time of interpretation on 05/09/2020 at 8:50 am to provider [facility] , who verbally acknowledged these results. Electronically Signed   By: Feliberto Harts MD   On: 05/09/2020 09:12   CT ANGIO NECK W OR WO CONTRAST  Result Date: 05/09/2020 CLINICAL DATA:  Stroke/TIA.  Assess intracranial arteries. EXAM: CT ANGIOGRAPHY HEAD AND NECK TECHNIQUE: Multidetector CT imaging of the head and neck was performed using the standard protocol during bolus administration of intravenous contrast. Multiplanar CT image reconstructions and MIPs were obtained to evaluate the vascular anatomy. Carotid stenosis measurements (when applicable) are obtained utilizing NASCET criteria, using the distal internal carotid diameter as the denominator. CONTRAST:  75mL OMNIPAQUE IOHEXOL 350 MG/ML SOLN COMPARISON:  Prior CT and MRI exams from May 06, 2020. FINDINGS: CTA NECK FINDINGS Aortic arch: Great vessel origins are patent. Please see prior CTA for characterization of  ascending aortic aneurysm, not imaged on this study. Right carotid system: No evidence of dissection, stenosis (50% or greater) or occlusion. Left carotid system: Similar  abrupt occlusion of the proximal cervical internal carotid artery with non opacification of the internal carotid artery in the remainder of the neck. Vertebral arteries: Right dominant. No evidence of dissection, stenosis (50% or greater) or occlusion. Skeleton: No acute fracture. Other neck: No mass or suspicious adenopathy. Upper chest: No acute findings. Review of the MIP images confirms the above findings CTA HEAD FINDINGS Anterior circulation: The left petrous and cavernous ICA is not opacified, similar to prior. In comparison to prior, there is new occlusion of the proximal left M1 MCA. Minimal opacification of the paraclinoid left ICA is likely secondary to retrograde flow from the left A1 ACA. There is poor opacification of more distal left MCA branches. The right ICA and right MCA and ACA branches are patent without evidence of hemodynamically significant stenosis. Posterior circulation: Right fetal type PCA. No evidence of hemodynamically significant proximal stenosis or large vessel occlusion in the posterior circulation. Venous sinuses: As permitted by contrast timing, patent. 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 New occlusion of the left M1 MCA proximally with poor opacification of distal left MCA branches, compatible with propogation of thrombus. Critical Value/emergent results were called by telephone at the time of interpretation on 05/09/2020 at 8:50 am to provider [facility] , who verbally acknowledged these results. Electronically Signed   By: Feliberto Harts MD   On: 05/09/2020 09:12   DG Swallowing Func-Speech Pathology  Result Date: 06/06/2020 Objective Swallowing Evaluation: Type of Study: MBS-Modified Barium Swallow Study  Patient Details Name: Aaron Weiss MRN: [id number] Date  of Birth: [birth year] Today's Date: 06/06/2020 Past Medical History: No past medical history on file. Past Surgical History: Past Surgical History: Procedure Laterality Date . BUBBLE STUDY  05/10/2020  Procedure: BUBBLE STUDY;  Surgeon: Jake Bathe, MD;  Location: [facility] ENDOSCOPY;  Service: Cardiovascular;; . TEE WITHOUT CARDIOVERSION N/A 05/10/2020  Procedure: TRANSESOPHAGEAL ECHOCARDIOGRAM (TEE);  Surgeon: Jake Bathe, MD;  Location: [facility] ENDOSCOPY;  Service: Cardiovascular;  Laterality: N/A; HPI: 54 y.o. Caucasian male with PMH of HTN but not seeing doctors for years presented to ED 05/06/20 as code stroke. NIHSS 6 with global partial aphasia; CT head chronic left cerebellar infarct, subacute/chronic left superior parietal infarct, subacute left parietal infarct, acute left frontal infarct. 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 Pt acutely developed right side paresis, facial droop and CT revealed extension of stroke. Pt admitted to [facility] 05/15/20. Assessment / Plan / Recommendation CHL IP CLINICAL IMPRESSIONS 06/06/2020 Clinical Impression Pt demonstrates mild oropharyngeal dysphagia, but remarkably improved since last MBSS 05/19/20. Pt is much less impulsive with PO intake at this time, and although swallow initiation of thin barium occurred between the vallecular and pyrifrom sinuses, no aspiration was observed throughout study. Pt independently takes small, slow sips, and performs extra dry swallows, which aids in clearing minimal lingual, vallecular, and pyriform sinus residue. His anterior and superior hyolaryngeal excursion and vestibule closure was functional to protect his airway throughout trials of thin barium via straw and cup (improved since last study). When cued to attempt larger consecutive sips via straw, trace penetration noted to enter the vestibule posteriorly between the arytenoids. Otherwise, consumption of single sips via straw was functional, and straw minimized right anterior loss (due to  right facial and labial weakness). Pt demonstrated difficulty with posterior propulsion and cohesion of pill with thin barium - ultimately he was unable to coordinate dual consistency bolus and required presentations of puree to clear barium pill. Brief scan of esophagus  was unremarkable. Recommend pt continue dysphagia 3 solid textures (mechanical soft), upgrade to thin liquids, medications should be given whole with puree. Pt should have full supervision during PO intake to ensure use of extra dry swallows (which pt demonstrated ability to perform without cues during study) and other safe swallow precautions. ST will continue to provide skilled interventions to ensure diet safety and efficiency, as well as increase his independence with use of compensatory swallow strategies while inpatient. SLP Visit Diagnosis Dysphagia, oropharyngeal phase (R13.12);Aphasia (R47.01) Attention and concentration deficit following -- Frontal lobe and executive function deficit following -- Impact on safety and function Mild aspiration risk   CHL IP TREATMENT RECOMMENDATION 05/09/2020 Treatment Recommendations Therapy as outlined in treatment plan below   Prognosis 05/09/2020 Prognosis for Safe Diet Advancement Good Barriers to Reach Goals Severity of deficits Barriers/Prognosis Comment -- CHL IP DIET RECOMMENDATION 06/06/2020 SLP Diet Recommendations Dysphagia 3 (Mech soft) solids;Thin liquid Liquid Administration via Cup;Straw Medication Administration Whole meds with puree Compensations Minimize environmental distractions;Slow rate;Small sips/bites;Lingual sweep for clearance of pocketing Postural Changes Remain semi-upright after after feeds/meals (Comment);Seated upright at 90 degrees   CHL IP OTHER RECOMMENDATIONS 06/06/2020 Recommended Consults -- Oral Care Recommendations Oral care BID Other Recommendations --   CHL IP FOLLOW UP RECOMMENDATIONS 05/13/2020 Follow up Recommendations Inpatient Rehab   CHL IP FREQUENCY AND  DURATION 05/09/2020 Speech Therapy Frequency (ACUTE ONLY) min 2x/week Treatment Duration 2 weeks      CHL IP ORAL PHASE 06/06/2020 Oral Phase Impaired Oral - Pudding Teaspoon -- Oral - Pudding Cup -- Oral - Honey Teaspoon NT Oral - Honey Cup NT Oral - Nectar Teaspoon NT Oral - Nectar Cup NT Oral - Nectar Straw NT Oral - Thin Teaspoon -- Oral - Thin Cup Piecemeal swallowing;Lingual pumping;Lingual/palatal residue;Right anterior bolus loss Oral - Thin Straw Piecemeal swallowing;Lingual/palatal residue Oral - Puree WFL Oral - Mech Soft -- Oral - Regular -- Oral - Multi-Consistency Decreased bolus cohesion Oral - Pill Delayed oral transit;Lingual pumping;Reduced posterior propulsion Oral Phase - Comment --  CHL IP PHARYNGEAL PHASE 06/06/2020 Pharyngeal Phase Impaired Pharyngeal- Pudding Teaspoon -- Pharyngeal -- Pharyngeal- Pudding Cup -- Pharyngeal -- Pharyngeal- Honey Teaspoon NT Pharyngeal -- Pharyngeal- Honey Cup NT Pharyngeal -- Pharyngeal- Nectar Teaspoon -- Pharyngeal -- Pharyngeal- Nectar Cup NT Pharyngeal -- Pharyngeal- Nectar Straw -- Pharyngeal -- Pharyngeal- Thin Teaspoon -- Pharyngeal -- Pharyngeal- Thin Cup Delayed swallow initiation-vallecula;Delayed swallow initiation-pyriform sinuses;Pharyngeal residue - valleculae;Pharyngeal residue - pyriform Pharyngeal -- Pharyngeal- Thin Straw Delayed swallow initiation-vallecula;Delayed swallow initiation-pyriform sinuses;Reduced airway/laryngeal closure;Pharyngeal residue - valleculae;Pharyngeal residue - pyriform;Inter-arytenoid space residue;Penetration/Aspiration during swallow Pharyngeal Material enters airway, CONTACTS cords and then ejected out Pharyngeal- Puree WFL Pharyngeal -- Pharyngeal- Mechanical Soft -- Pharyngeal -- Pharyngeal- Regular -- Pharyngeal -- Pharyngeal- Multi-consistency WFL Pharyngeal -- Pharyngeal- Pill WFL Pharyngeal -- Pharyngeal Comment --  CHL IP CERVICAL ESOPHAGEAL PHASE 06/06/2020 Cervical Esophageal Phase WFL Pudding Teaspoon --  Pudding Cup -- Honey Teaspoon -- Honey Cup -- Nectar Teaspoon -- Nectar Cup -- Nectar Straw -- Thin Teaspoon -- Thin Cup -- Thin Straw -- Puree -- Mechanical Soft -- Regular -- Multi-consistency -- Pill -- Cervical Esophageal Comment -- [name] 06/06/2020, 10:16 AM              DG Swallowing Func-Speech Pathology  Result Date: 05/19/2020 Objective Swallowing Evaluation: Type of Study: MBS-Modified Barium Swallow Study  Patient Details Name: Aaron Weiss MRN: [id number] Date of Birth: 1965/08/03 Today's Date: 05/19/2020 Past Medical History: No past medical history on  file. Past Surgical History: Past Surgical History: Procedure Laterality Date . BUBBLE STUDY  05/10/2020  Procedure: BUBBLE STUDY;  Surgeon: Jake Bathe, MD;  Location: [facility] ENDOSCOPY;  Service: Cardiovascular;; . TEE WITHOUT CARDIOVERSION N/A 05/10/2020  Procedure: TRANSESOPHAGEAL ECHOCARDIOGRAM (TEE);  Surgeon: Jake Bathe, MD;  Location: [facility] ENDOSCOPY;  Service: Cardiovascular;  Laterality: N/A; HPI: 54 y.o. Caucasian male with PMH of HTN but not seeing doctors for years presented to ED 05/06/20 as code stroke. NIHSS 6 with global partial aphasia; CT head chronic left cerebellar infarct, subacute/chronic left superior parietal infarct, subacute left parietal infarct, acute left frontal infarct. 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 Pt acutely developed right side paresis, facial droop and CT revealed extension of stroke. Pt admitted to [facility] 05/15/20.  Assessment / Plan / Recommendation CHL IP CLINICAL IMPRESSIONS 05/19/2020 Clinical Impression Pt continues to present with moderately severe oropharyngeal dysphagia, however noteworthy functional improvement in swallow function was visualized during repeat MBSS today (in comparison to initial MBSS on 05/09/20). Pt exhibits significant weakness of facial and oral musculature, which results in anterior spillage, labial residue, weak lingual manipulation of boluses, and decreased oral  control of thinner boluses. Oral phase deficits resulted in premature spillage of nectar thick barium into the laryngeal vestibule, which was aspirated during the swallow without any sensation (PAS score 8). Pt's volitional cough was weak and was ineffective to clear aspirate when cued by SLP. Improved oral control of honey thick liquids noted today. His swallow initial of honey thick barium occurred most consistently at the vallecular sinuses, or just slightly past. Timing of of epiglottic deflection is still mildly delayed, however pharyngeal stripping and overall effectiveness of airway protection is improved. No aspiration noted throughout several honey thick barium trials via cup and tsp. A small amount of honey did penetrate above the level of the vocal folds X1 (PAS score 2), however during next set of imaging, the vestibule was clear of penetrates.  Recommend pt continue pureed solid textures (dys 1), upgrade to honey thick liquids, continue medications crushed in applesauce or pudding and full supervision to ensure safe swallow strategies. Pt may take some self-fed small cup sips of honey liquids, however intake was most efficient when boluses were delivered via tsp - pt will need assistance with using tsp. ST will continue to provide skilled interventions in order to ensure diet safety and efficiency, as well as readiness for additional testing, advancement, and increasing independence with compensatory swallow strategies. SLP Visit Diagnosis Dysphagia, oropharyngeal phase (R13.12);Aphasia (R47.01) Attention and concentration deficit following -- Frontal lobe and executive function deficit following -- Impact on safety and function Moderate aspiration risk   CHL IP TREATMENT RECOMMENDATION 05/09/2020 Treatment Recommendations Therapy as outlined in treatment plan below   Prognosis 05/09/2020 Prognosis for Safe Diet Advancement Good Barriers to Reach Goals Severity of deficits Barriers/Prognosis Comment --  CHL IP DIET RECOMMENDATION 05/19/2020 SLP Diet Recommendations Dysphagia 1 (Puree) solids;Honey thick liquids Liquid Administration via Spoon;Cup Medication Administration Crushed with puree Compensations Minimize environmental distractions;Slow rate;Small sips/bites;Lingual sweep for clearance of pocketing Postural Changes Remain semi-upright after after feeds/meals (Comment);Seated upright at 90 degrees   CHL IP OTHER RECOMMENDATIONS 05/19/2020 Recommended Consults -- Oral Care Recommendations Oral care BID Other Recommendations Order thickener from pharmacy;Remove water pitcher;Prohibited food (jello, ice cream, thin soups);Have oral suction available   CHL IP FOLLOW UP RECOMMENDATIONS 05/13/2020 Follow up Recommendations Inpatient Rehab   CHL IP FREQUENCY AND DURATION 05/09/2020 Speech Therapy Frequency (ACUTE ONLY) min 2x/week Treatment  Duration 2 weeks      CHL IP ORAL PHASE 05/19/2020 Oral Phase Impaired Oral - Pudding Teaspoon -- Oral - Pudding Cup -- Oral - Honey Teaspoon Weak lingual manipulation;Right anterior bolus loss Oral - Honey Cup Right anterior bolus loss;Weak lingual manipulation;Decreased bolus cohesion Oral - Nectar Teaspoon NT Oral - Nectar Cup Weak lingual manipulation;Decreased bolus cohesion;Premature spillage;Right anterior bolus loss Oral - Nectar Straw NT Oral - Thin Teaspoon -- Oral - Thin Cup -- Oral - Thin Straw -- Oral - Puree WFL Oral - Mech Soft -- Oral - Regular -- Oral - Multi-Consistency -- Oral - Pill -- Oral Phase - Comment --  CHL IP PHARYNGEAL PHASE 05/19/2020 Pharyngeal Phase -- Pharyngeal- Pudding Teaspoon -- Pharyngeal -- Pharyngeal- Pudding Cup -- Pharyngeal -- Pharyngeal- Honey Teaspoon Delayed swallow initiation-vallecula;Reduced airway/laryngeal closure;Reduced tongue base retraction Pharyngeal Material does not enter airway Pharyngeal- Honey Cup Delayed swallow initiation-vallecula;Reduced airway/laryngeal closure;Penetration/Aspiration during swallow;Reduced tongue  base retraction Pharyngeal Material enters airway, remains ABOVE vocal cords then ejected out Pharyngeal- Nectar Teaspoon -- Pharyngeal -- Pharyngeal- Nectar Cup Delayed swallow initiation-pyriform sinuses;Reduced airway/laryngeal closure;Penetration/Aspiration before swallow;Penetration/Aspiration during swallow;Reduced tongue base retraction Pharyngeal Material enters airway, passes BELOW cords without attempt by patient to eject out (silent aspiration) Pharyngeal- Nectar Straw -- Pharyngeal -- Pharyngeal- Thin Teaspoon -- Pharyngeal -- Pharyngeal- Thin Cup -- Pharyngeal -- Pharyngeal- Thin Straw -- Pharyngeal -- Pharyngeal- Puree WFL Pharyngeal -- Pharyngeal- Mechanical Soft -- Pharyngeal -- Pharyngeal- Regular -- Pharyngeal -- Pharyngeal- Multi-consistency -- Pharyngeal -- Pharyngeal- Pill -- Pharyngeal -- Pharyngeal Comment --  CHL IP CERVICAL ESOPHAGEAL PHASE 05/19/2020 Cervical Esophageal Phase WFL Pudding Teaspoon -- Pudding Cup -- Honey Teaspoon -- Honey Cup -- Nectar Teaspoon -- Nectar Cup -- Nectar Straw -- Thin Teaspoon -- Thin Cup -- Thin Straw -- Puree -- Mechanical Soft -- Regular -- Multi-consistency -- Pill -- Cervical Esophageal Comment -- [name] 05/19/2020, 9:54 AM              DG Swallowing Func-Speech Pathology  Result Date: 05/09/2020 Objective Swallowing Evaluation: Type of Study: MBS-Modified Barium Swallow Study  Patient Details Name: Aaron Weiss MRN: [id number] Date of Birth: 1966/01/31 Today's Date: 05/09/2020 Time: SLP Start Time (ACUTE ONLY): 1255 -SLP Stop Time (ACUTE ONLY): 1316 SLP Time Calculation (min) (ACUTE ONLY): 21 min Past Medical History: No past medical history on file. Past Surgical History: No past surgical history on file. HPI: 54 y.o. Caucasian male with PMH of HTN but not seeing doctors for years presented to ED 05/06/20 as code stroke. NIHSS 6 with global partial aphasia; CT head chronic left cerebellar infarct, subacute/chronic left superior parietal  infarct, subacute left parietal infarct, acute left frontal infarct. 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 Pt acutely developed right side paresis, facial droop and CT revealed extension of stroke.  Subjective: alert Assessment / Plan / Recommendation CHL IP CLINICAL IMPRESSIONS 05/09/2020 Clinical Impression Pt presents with a moderate neurogenic dysphagia impacting timing and mobility.  His pharyngeal function was marked by poor timing of laryngeal vestibule closure, incomplete laryngeal elevation, and reduced pharyngeal stripping. Pt demonstrated oral spillage of materials from right anterior side of mouth.  There were mild deficits in cohesion, but generally controlled propulsion into the pharynx.  Honey thick liquids penetrated the larynx and were aspirated to just below the vocal folds, eliciting a delayed but eventual cough response.  Purees penetrated the entrance of the vestibule in trace amounts and inconsistently.  Notable was intermittent backflow from the cervical  esophagus into the pyriform space.  Recommend maintaining a dysphagia 1 diet, downgrade liquids to pudding consistency, and crush meds.  Provide full supervision with meals. SLP will follow closely to ensure safety and adequate intake.  He remains a high aspiration risk.  SLP Visit Diagnosis Dysphagia, oropharyngeal phase (R13.12) Attention and concentration deficit following -- Frontal lobe and executive function deficit following -- Impact on safety and function Moderate aspiration risk   CHL IP TREATMENT RECOMMENDATION 05/09/2020 Treatment Recommendations Therapy as outlined in treatment plan below   Prognosis 05/09/2020 Prognosis for Safe Diet Advancement Good Barriers to Reach Goals Severity of deficits Barriers/Prognosis Comment -- CHL IP DIET RECOMMENDATION 05/09/2020 SLP Diet Recommendations Pudding thick liquid;Dysphagia 1 (Puree) solids Liquid Administration via Spoon Medication Administration Crushed with puree Compensations  Slow rate;Small sips/bites;Lingual sweep for clearance of pocketing;Clear throat intermittently Postural Changes Remain semi-upright after after feeds/meals (Comment)   CHL IP OTHER RECOMMENDATIONS 05/09/2020 Recommended Consults -- Oral Care Recommendations Oral care BID Other Recommendations Order thickener from pharmacy   CHL IP FOLLOW UP RECOMMENDATIONS 05/09/2020 Follow up Recommendations Inpatient Rehab   CHL IP FREQUENCY AND DURATION 05/09/2020 Speech Therapy Frequency (ACUTE ONLY) min 2x/week Treatment Duration 2 weeks      CHL IP ORAL PHASE 05/09/2020 Oral Phase Impaired Oral - Pudding Teaspoon -- Oral - Pudding Cup -- Oral - Honey Teaspoon Right anterior bolus loss;Decreased bolus cohesion Oral - Honey Cup -- Oral - Nectar Teaspoon -- Oral - Nectar Cup -- Oral - Nectar Straw -- Oral - Thin Teaspoon -- Oral - Thin Cup -- Oral - Thin Straw -- Oral - Puree Right anterior bolus loss Oral - Mech Soft -- Oral - Regular -- Oral - Multi-Consistency -- Oral - Pill -- Oral Phase - Comment --  CHL IP PHARYNGEAL PHASE 05/09/2020 Pharyngeal Phase Impaired Pharyngeal- Pudding Teaspoon -- Pharyngeal -- Pharyngeal- Pudding Cup -- Pharyngeal -- Pharyngeal- Honey Teaspoon Delayed swallow initiation-vallecula;Delayed swallow initiation-pyriform sinuses;Reduced pharyngeal peristalsis;Reduced laryngeal elevation;Reduced airway/laryngeal closure;Reduced tongue base retraction;Penetration/Aspiration before swallow;Penetration/Aspiration during swallow;Pharyngeal residue - valleculae;Trace aspiration Pharyngeal Material enters airway, passes BELOW cords and not ejected out despite cough attempt by patient Pharyngeal- Honey Cup -- Pharyngeal -- Pharyngeal- Nectar Teaspoon -- Pharyngeal -- Pharyngeal- Nectar Cup -- Pharyngeal -- Pharyngeal- Nectar Straw -- Pharyngeal -- Pharyngeal- Thin Teaspoon -- Pharyngeal -- Pharyngeal- Thin Cup -- Pharyngeal -- Pharyngeal- Thin Straw -- Pharyngeal -- Pharyngeal- Puree Delayed swallow  initiation-vallecula;Delayed swallow initiation-pyriform sinuses;Reduced pharyngeal peristalsis;Reduced laryngeal elevation;Reduced airway/laryngeal closure;Reduced tongue base retraction;Penetration/Aspiration before swallow;Penetration/Aspiration during swallow;Pharyngeal residue - valleculae Pharyngeal Material enters airway, remains ABOVE vocal cords and not ejected out Pharyngeal- Mechanical Soft -- Pharyngeal -- Pharyngeal- Regular -- Pharyngeal -- Pharyngeal- Multi-consistency -- Pharyngeal -- Pharyngeal- Pill -- Pharyngeal -- Pharyngeal Comment --  CHL IP CERVICAL ESOPHAGEAL PHASE 05/09/2020 Cervical Esophageal Phase (No Data) Pudding Teaspoon -- Pudding Cup -- Honey Teaspoon -- Honey Cup -- Nectar Teaspoon -- Nectar Cup -- Nectar Straw -- Thin Teaspoon -- Thin Cup -- Thin Straw -- Puree -- Mechanical Soft -- Regular -- Multi-consistency -- Pill -- Cervical Esophageal Comment -- Blenda MountsCouture, Amanda Laurice 05/09/2020, 1:36 PM              ECHO TEE  Result Date: 05/10/2020    TRANSESOPHOGEAL ECHO REPORT   Patient Name:   Michelene GardenerSANDY ALAN Chavero Date of Exam: 05/10/2020 Medical Rec #:  [id number]         Height: Accession #:    [id number]        Weight:  209.9 lb Date of Birth:  10/05/1965         BSA:          2.085 m Patient Age:    54 years          BP:           145/79 mmHg Patient Gender: M                 HR:           103 bpm. Exam Location:  Inpatient Procedure: Transesophageal Echo, Color Doppler, Cardiac Doppler and Saline            Contrast Bubble Study Indications:     Stroke  History:         Patient has prior history of Echocardiogram examinations, most                  recent 05/07/2020.  Sonographer:     Thurman Coyer RDCS (AE) Referring Phys:  [id number] Beatriz Stallion Diagnosing Phys: Donato Schultz MD PROCEDURE: The transesophogeal probe was passed without difficulty through the esophogus of the patient. Sedation performed by different physician. The patient was monitored while under deep  sedation. Anesthestetic sedation was provided intravenously by Anesthesiology: 302.04mg  of Propofol. The patient developed no complications during the procedure. IMPRESSIONS  1. Left ventricular ejection fraction, by estimation, is 55 to 60%. The left ventricle has normal function.  2. Right ventricular systolic function is normal. The right ventricular size is normal.  3. No left atrial/left atrial appendage thrombus was detected.  4. The mitral valve is normal in structure. Trivial mitral valve regurgitation.  5. The aortic valve is normal in structure. Aortic valve regurgitation is trivial.  6. Agitated saline contrast bubble study was negative, with no evidence of any interatrial shunt. Conclusion(s)/Recommendation(s): No LA/LAA thrombus identified. Negative bubble study for interatrial shunt. No intracardiac source of embolism detected on this on this transesophageal echocardiogram. FINDINGS  Left Ventricle: Left ventricular ejection fraction, by estimation, is 55 to 60%. The left ventricle has normal function. The left ventricular internal cavity size was normal in size. Right Ventricle: The right ventricular size is normal. No increase in right ventricular wall thickness. Right ventricular systolic function is normal. Left Atrium: Left atrial size was normal in size. No left atrial/left atrial appendage thrombus was detected. Right Atrium: Right atrial size was normal in size. Pericardium: There is no evidence of pericardial effusion. Mitral Valve: The mitral valve is normal in structure. Trivial mitral valve regurgitation. Tricuspid Valve: The tricuspid valve is normal in structure. Tricuspid valve regurgitation is trivial. Aortic Valve: The aortic valve is normal in structure. Aortic valve regurgitation is trivial. Pulmonic Valve: The pulmonic valve was normal in structure. Pulmonic valve regurgitation is not visualized. Aorta: The aortic root is normal in size and structure. IAS/Shunts: No atrial level  shunt detected by color flow Doppler. Agitated saline contrast was given intravenously to evaluate for intracardiac shunting. Agitated saline contrast bubble study was negative, with no evidence of any interatrial shunt. MD Electronically signed by Donato Schultz MD Signature Date/Time: 05/10/2020/10:52:41 AM    Final    CT HEAD CODE STROKE WO CONTRAST`  Result Date: 05/09/2020 CLINICAL DATA:  Code stroke. Neuro deficit, acute stroke suspected. Right-sided weakness. EXAM: CT HEAD WITHOUT CONTRAST TECHNIQUE: Contiguous axial images were obtained from the base of the skull through the vertex without intravenous contrast. COMPARISON:  CT and MRI May 06, 2020 FINDINGS: Brain: Evolving left MCA  territory infarcts. Additionally, there is loss of gray-white differentiation in the left frontal lobe in an area that was previously not restricting diffusion. Hyperdensity within the more posterior left MCA territory infarct (for example series 5, image 49 and series 3, image 18). Progressive edema and mass effect with approximately 2 mm of rightward midline shift. Basal cisterns are patent. No hydrocephalus. No mass lesion. Remote left cerebellar infarct. Vascular: Hyperdense M1 MCA. Additionally, hyperdensity of more distal MCA branches. Skull: No acute fracture. Sinuses/Orbits: Scattered paranasal sinus mucosal thickening Other: No mastoid effusions. IMPRESSION: 1. Evolving left MCA territory infarcts. Additionally, there is loss of gray-white differentiation in the left frontal lobe in an area that was previously not restricting diffusion, concerning for expansion of infarct. MRI could further evaluate if clinically indicated. 2. Progression of associated edema and mass effect with approximately 2 mm of rightward midline shift. 3. Hyperdense proximal and distal left MCA, concerning for thrombosis. See forthcoming CTA for further evaluation. 4. Hyperdensity within the more posterior left MCA territory  infarct, which could represent petechial hemorrhage versus thrombosed cortical vessels. Code stroke imaging results were communicated on 05/09/2020 at 8:43 am to provider Dr. Roda Shutters Via telephone, who verbally acknowledged these results. Electronically Signed   By: Feliberto Harts MD   On: 05/09/2020 08:49    Labs:  Basic Metabolic Panel: Recent Labs  Lab 06/05/20 0605  CREATININE 0.96    CBC: No results for input(s): WBC, NEUTROABS, HGB, HCT, MCV, PLT in the last 168 hours.  CBG: Recent Labs  Lab 06/07/20 0610 06/07/20 1220 06/07/20 1748 06/07/20 1841 06/07/20 2116  GLUCAP 105* 143* 99 94 120*   Family history.  Father with CVA Sister with diabetes Brother with hyperlipidemia.  Denies any colon cancer esophageal cancer rectal cancer  Brief HPI:   Aaron Weiss is a 54 y.o. right-handed male with history of hypertension but not seeing a doctor in years.  He lives with his brother and son.  Reportedly independent prior to admission.  Two-level home bed and bath upstairs.  Presented 05/06/2020 with headache altered mental status and aphasia of acute onset.  Blood pressure 150/90.  Glucose 258.  Cranial CT scan concerning for acute left MCA territory infarction.  No acute hemorrhage.  Remote left cerebellar infarction.  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  Patient did not receive TPA.  CT cerebral perfusion scan partially imaged ascending aortic aneurysm dilation up to 4.1 cm recommendations for annual imaging followed by CTA or MRA.  MRI follow-up showed large acute early subacute left MCA infarction.  Echocardiogram with ejection fraction of 60 to 65% no wall motion abnormalities grade 1 diastolic dysfunction.  TEE showed normal ejection fraction without thrombus.  Admission chemistries glucose 261 hemoglobin A1c 8.7 urine drug screen negative.  Maintained on aspirin 325 mg daily and Plavix for CVA prophylaxis  x3 months and aspirin alone.  Subcutaneous Lovenox for DVT prophylaxis.  Maintain on dysphagia #1 pudding thick liquid diet.  Due to patient decrease in functional mobility and need for assistance with ADLs he was admitted for a comprehensive rehab program.   Hospital Course: Aaron Weiss was admitted to rehab 05/15/2020 for inpatient therapies to consist of PT, ST and OT at least three hours five days a week. Past admission physiatrist, therapy team and rehab RN have worked together to provide customized collaborative inpatient rehab.  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  Patient would follow-up neurology services.  Blood pressure controlled on lisinopril 5 mg daily would need outpatient follow-up.  Noted dysphagia and diet has been advanced to mechanical soft thin liquid monitoring of hydration follow-up speech therapy.  Subcutaneous Lovenox for DVT prophylaxis no bleeding episodes.  New findings diabetes mellitus hemoglobin A1c 8.7 insulin therapy as directed diabetic teaching again patient will need outpatient follow-up.  Lipitor ongoing for hyperlipidemia.  Incidental findings of ascending aortic aneurysm with dilation 4.1 cm recommendations annual imaging followed by CTA or MRA.  Prerenal azotemia creatinine improved latest 0.81.  Bouts of urinary retention Flomax initiated with follow-up per urology services as first Foley catheter tube was placed with sensor wire and would remain in place until follow-up outpatient with urology services for voiding trial.   Blood pressures were monitored on TID basis and controlled  Diabetes has been monitored with ac/hs CBG checks and SSI was use prn for tighter BS control.    Rehab course: During patient's stay in rehab weekly team conferences were held to monitor patient's progress, set goals and discuss barriers  to discharge. At admission, patient required minimal assist sit to stand moderate assist sit to supine minimal guard 250 feet without assistive device.  Total assist upper body dressing total assist lower body dressing max assist toilet transfers  Physical exam.  Blood pressure 135/94 pulse 87 temperature 98.9 respirations 16 oxygen saturation 94% room air Constitutional.  No acute distress globally aphasic able to follow simple yes no responses HEENT Head.  Normocephalic and atraumatic Eyes.  Pupils round and reactive to light no discharge.nystagmus Neck.  Supple nontender no JVD without thyromegaly Cardiac regular rate rhythm without extra sounds or murmur heard Abdomen.  Soft nontender positive bowel sounds without rebound Respiratory effort normal no respiratory distress without wheeze Neurologic.  Cranial nerves II through XII intact motor strength 5/5 left deltoid bicep tricep grip hip flexors knee extensors ankle dorsi plantarflexion 0 out of 5 in right upper and lower limb.  Sensory could not be assessed due to aphasia.  He/  has had improvement in activity tolerance, balance, postural control as well as ability to compensate for deficits. He/ has had improvement in functional use RUE/LUE  and RLE/LLE as well as improvement in awareness.  Perform supine to sit with use of bed features increased time contact-guard assist.  Stand pivot with minimal assist using rolling walker.  Gait training rolling walker minimal assist participated in stair training bilateral rails moderate assist and step to pattern.  Patient was able to use compensatory measures to get right lower extremity onto step when ascending and required assist for proper foot placement when descending.  He is able to don shorts at bed level with minimal assist.  Max assist dependent for socks and shoes.  Supine to sitting edge of bed with minimal assist.  Sit pivot transfers bed to wheelchair minimal assist.  Set up for breakfast  supervision and cues to clear her right side of mouth at times.  Speech therapy follow-up noted patient much less impulsive performs extra dry swallows to maintain diet restrictions.  He was able to communicate his simple needs.  Full family teaching completed plan discharge to home       Disposition: Discharged to home    Diet: Mechanical soft thin liquids diabetic restrictions  Special Instructions: No driving smoking or alcohol  Routine  Foley catheter care  Medications at discharge 1.  Tylenol as needed 2.  Aspirin 325 mg p.o. daily 3.  Lipitor 80 mg p.o. daily 4.  Plavix 75 mg p.o. daily 5.  Zestril 5 mg p.o. daily 6.  Flomax 0.4 mg daily  30-35 minutes were spent completing discharge summary and discharge planning  Discharge Instructions    Ambulatory referral to Neurology   Complete by: As directed    An appointment is requested in approximately 4 weeks left MCA infarct due to left ICA and M2 branch occlusion   Ambulatory referral to Physical Medicine Rehab   Complete by: As directed    Moderate complexity follow-up 1 to 2 weeks left MCA/left ICA infarction   Ambulatory referral to Urology   Complete by: As directed    Follow-up 1 to 2 weeks Foley catheter tube in place for urinary retention placed with sensor wire.  Patient will need follow-up for voiding trial       Follow-up Information    Kirsteins, Victorino Sparrow, MD Follow up.   Specialty: Physical Medicine and Rehabilitation Why: Office to call for appointment Contact information: [address] [place] [address] [phone]               Signed Charlton Amor 06/08/2020, 5:25 AM Physician Discharge Summary

## 2020-06-08 ENCOUNTER — Inpatient Hospital Stay (HOSPITAL_COMMUNITY): Payer: BC Managed Care – PPO | Admitting: Physical Therapy

## 2020-06-08 ENCOUNTER — Inpatient Hospital Stay (HOSPITAL_COMMUNITY): Payer: BC Managed Care – PPO | Admitting: Speech Pathology

## 2020-06-08 ENCOUNTER — Inpatient Hospital Stay (HOSPITAL_COMMUNITY): Payer: BC Managed Care – PPO | Admitting: Occupational Therapy

## 2020-06-08 LAB — GLUCOSE, CAPILLARY
Glucose-Capillary: 98 mg/dL (ref 70–99)
Glucose-Capillary: 142 mg/dL — ABNORMAL HIGH (ref 70–99)
Glucose-Capillary: 94 mg/dL (ref 70–99)
Glucose-Capillary: 113 mg/dL — ABNORMAL HIGH (ref 70–99)

## 2020-06-08 NOTE — Progress Notes (Signed)
Occupational Therapy Discharge Summary  Patient Details  Name: Aaron Weiss MRN: [id number] Date of Birth: 1965/07/28  Today's Date: 06/08/2020 OT Individual Time: 1110-1203 OT Individual Time Calculation (min): 53 min   Session Note:  Pt worked on shower and dressing during session.  Mod assist for functional mobility with AFO in place on the right foot and no assistive device.  He was able to remove clothing with overall mod assist prior to shower.  Bathing was completed with min assist sit to stand.  Noted motor planning deficits with attempted manipulation of the hand held shower.  Mod instructional cueing needed for sequencing with max hand over hand use needed for integration of the LUE for washing the right arm as well as for holding items.  He did exhibit a synergy pattern in the left shoulder when lifting the arm for washing it.  Once he dried off, he completed transfer to the wheelchair with min assist stand pivot for dressing tasks at the sink.  Mod assist for donning brief and pants sit to stand with total assist for TEDS.  Mod assist for donning gripper socks with increased difficulty starting the sock over his toes.  He was able to donn a pullover scrub top with supervision.  Min assist for transfer to the bed from the wheelchair to conclude session.  Call button and phone in reach with safety belt in place.    Patient has met 8 of 12 long term goals due to improved activity tolerance, improved balance, postural control, ability to compensate for deficits, functional use of  RIGHT upper and RIGHT lower extremity, improved attention, improved awareness.  Patient to discharge at [facility] Assist level.  Patient's care partner is independent to provide the necessary physical and cognitive assistance at discharge.    Reasons goals not met: Pt continues to need min assist or greater for sit to stand and dynamic sitting balance, he also continues to need mod assist for LB dressing and max  assist for integration of the RUE for functional use into selfcare tasks.    Recommendation:  Patient will benefit from ongoing skilled OT services in home health setting to continue to advance functional skills in the area of BADL and Reduce care partner burden.  Pt continues to demonstrate motor planning difficulty as well as safety awareness deficits and RUE/RLE hemiparesis.  Feel he will need 24 hr min assist at home as well as continued HHOT to progress to supervision level or better.   Equipment: No equipment provided  Reasons for discharge: treatment goals met and discharge from hospital  Patient/family agrees with progress made and goals achieved: Yes  OT Discharge Precautions/Restrictions  Precautions Precautions: Fall Precaution Comments: aphasia Restrictions Weight Bearing Restrictions: No   Pain Pain Assessment Pain Scale: Faces Pain Score: 0-No pain ADL ADL Eating: Supervision/safety Where Assessed-Eating: Wheelchair Grooming: Supervision/safety Where Assessed-Grooming: Wheelchair Upper Body Bathing: Minimal assistance Where Assessed-Upper Body Bathing: Shower Lower Body Bathing: Minimal assistance Where Assessed-Lower Body Bathing: Shower Upper Body Dressing: Supervision/safety Where Assessed-Upper Body Dressing: Wheelchair Lower Body Dressing: Moderate assistance Where Assessed-Lower Body Dressing: Wheelchair,Standing at sink Toileting: Minimal assistance Where Assessed-Toileting: Research scientist (life sciences): Minimal assistance Armed forces technical officer Method: Arts development officer: Radiographer, therapeutic: Minimal Museum/gallery conservator Method: Stand pivot Tub/Shower Equipment: Walk in TEFL teacher Transfer: Minimal assistance Social research officer, government Method: Radiographer, therapeutic: Transfer tub bench ADL Comments: sit pivot transfer bed to/from w/c max A Vision  Baseline  Vision/History: No visual deficits Patient Visual Report: Blurring of vision;Peripheral vision impairment Vision Assessment?: Yes Eye Alignment: Within Functional Limits Ocular Range of Motion: Within Functional Limits Alignment/Gaze Preference: Within Defined Limits Tracking/Visual Pursuits: Decreased smoothness of eye movement to LEFT superior field;Decreased smoothness of eye movement to LEFT inferior field Saccades: Additional eye shifts occurred during testing Convergence: Within functional limits Visual Fields: Right visual field deficit Perception  Perception: Impaired Inattention/Neglect: Does not attend to right visual field Praxis Praxis: Impaired Praxis Impairment Details: Motor planning Praxis-Other Comments: Decreased manipulation of hand held shower with bathing tasks. Cognition Overall Cognitive Status: Impaired/Different from baseline Arousal/Alertness: Awake/alert Orientation Level: Oriented to person Attention: Sustained;Selective Sustained Attention: Appears intact Selective Attention: Impaired Memory: Impaired Memory Impairment: Decreased recall of new information Awareness: Impaired Awareness Impairment: Emergent impairment;Anticipatory impairment Problem Solving: Impaired Problem Solving Impairment: Functional complex;Functional basic Safety/Judgment: Impaired Comments: Decreased safety awareness Sensation Sensation Light Touch: Impaired Detail Central sensation comments: able to detect deep pressure in RUE, intermittent deep pressure in the RLE Light Touch Impaired Details: Absent RLE;Absent RUE Proprioception: Impaired by gross assessment (difficult to assess secondary to aphasia but suspect some decreased propriception in the RUE as he lets it hang down beside of him.) Stereognosis: Not tested Coordination Gross Motor Movements are Fluid and Coordinated: No Fine Motor Movements are Fluid and Coordinated: No Coordination and Movement Description: RUE  Brunnstrum stage II-III in the  RUE with stage II trace movement in the hand.  He needs hand over hand for integration at a gross assist level with selfcare tasks. Heel Shin Test: dysmetria RLE due to sensory deficits , strength deficits, and receptive aphasia Motor  Motor Motor: Hemiplegia;Abnormal tone;Abnormal postural alignment and control Motor - Discharge Observations: R hemiplegia, UE>LE. mild tone in the RUE Mobility  Bed Mobility Bed Mobility: Supine to Sit;Sit to Supine Rolling Right: Supervision/verbal cueing Rolling Left: Supervision/Verbal cueing Right Sidelying to Sit: Supervision/Verbal cueing Supine to Sit: Minimal Assistance - Patient > 75% Sit to Supine: Supervision/Verbal cueing Sit to Sidelying Right: Supervision/Verbal cueing Transfers Sit to Stand: Minimal Assistance - Patient > 75% Stand to Sit: Minimal Assistance - Patient > 75%  Trunk/Postural Assessment  Cervical Assessment Cervical Assessment: Exceptions to [facility] (slight forward head) Thoracic Assessment Thoracic Assessment: Exceptions to [facility] (thoracic flexion) Lumbar Assessment Lumbar Assessment: Exceptions to [facility] (posterior pelvic tilt) Postural Control Postural Control: Deficits on evaluation Trunk Control: sits w/r shoulder retracted, able patrtially  to correct w/cues Righting Reactions: delayed R  Balance Balance Balance Assessed: Yes Static Sitting Balance Static Sitting - Balance Support: Feet supported Static Sitting - Level of Assistance: 7: Independent Dynamic Sitting Balance Dynamic Sitting - Balance Support: Left upper extremity supported;Feet supported Dynamic Sitting - Level of Assistance: 6: Modified independent (Device/Increase time) Static Standing Balance Static Standing - Balance Support: During functional activity;Left upper extremity supported Static Standing - Level of Assistance: 4: Min assist Dynamic Standing Balance Dynamic Standing - Balance Support: During functional  activity;No upper extremity supported Dynamic Standing - Level of Assistance: 4: Min assist Extremity/Trunk Assessment RUE Assessment RUE Assessment: Exceptions to [facility] Active Range of Motion (AROM) Comments: Brunnstrum stage II-III in the arm with synergy pattern noted,  trace digit flexion but needs max hand over hand for integration into all tasks.  Increased flexor tone noted in the digits, elbow, and adductors. RUE Body System: Neuro Brunstrum levels for arm and hand: Arm;Hand Brunstrum level for arm: Stage III Synergy is performed voluntarily Brunstrum level for hand: Stage II Synergy  is developing LUE Assessment LUE Assessment: Within Functional Limits General Strength Comments: [place] for selfcare tasks   Verina Galeno OTR/L 06/08/2020, 12:23 PM

## 2020-06-08 NOTE — Progress Notes (Signed)
[place] PHYSICAL MEDICINE & REHABILITATION PROGRESS NOTE   Subjective/Complaints:  Remains with severe exp aphasia   ROS: Limited by aphasia, denies pain  Objective:   DG Swallowing Func-Speech Pathology  Result Date: 06/06/2020 Objective Swallowing Evaluation: Type of Study: MBS-Modified Barium Swallow Study  Patient Details Name: Aaron Weiss MRN: [id number] Date of Birth: September 18, 1965 Today's Date: 06/06/2020 Past Medical History: No past medical history on file. Past Surgical History: Past Surgical History: Procedure Laterality Date . BUBBLE STUDY  05/10/2020  Procedure: BUBBLE STUDY;  Surgeon: Jake Bathe, MD;  Location: [facility] ENDOSCOPY;  Service: Cardiovascular;; . TEE WITHOUT CARDIOVERSION N/A 05/10/2020  Procedure: TRANSESOPHAGEAL ECHOCARDIOGRAM (TEE);  Surgeon: Jake Bathe, MD;  Location: [facility] ENDOSCOPY;  Service: Cardiovascular;  Laterality: N/A; HPI: 54 y.o. Caucasian male with PMH of HTN but not seeing doctors for years presented to ED 05/06/20 as code stroke. NIHSS 6 with global partial aphasia; CT head chronic left cerebellar infarct, subacute/chronic left superior parietal infarct, subacute left parietal infarct, acute left frontal infarct. 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 Pt acutely developed right side paresis, facial droop and CT revealed extension of stroke. Pt admitted to [facility] 05/15/20. Assessment / Plan / Recommendation CHL IP CLINICAL IMPRESSIONS 06/06/2020 Clinical Impression Pt demonstrates mild oropharyngeal dysphagia, but remarkably improved since last MBSS 05/19/20. Pt is much less impulsive with PO intake at this time, and although swallow initiation of thin barium occurred between the vallecular and pyrifrom sinuses, no aspiration was observed throughout study. Pt independently takes small, slow sips, and performs extra dry swallows, which aids in clearing minimal lingual, vallecular, and pyriform sinus residue. His anterior and superior hyolaryngeal excursion  and vestibule closure was functional to protect his airway throughout trials of thin barium via straw and cup (improved since last study). When cued to attempt larger consecutive sips via straw, trace penetration noted to enter the vestibule posteriorly between the arytenoids. Otherwise, consumption of single sips via straw was functional, and straw minimized right anterior loss (due to right facial and labial weakness). Pt demonstrated difficulty with posterior propulsion and cohesion of pill with thin barium - ultimately he was unable to coordinate dual consistency bolus and required presentations of puree to clear barium pill. Brief scan of esophagus was unremarkable. Recommend pt continue dysphagia 3 solid textures (mechanical soft), upgrade to thin liquids, medications should be given whole with puree. Pt should have full supervision during PO intake to ensure use of extra dry swallows (which pt demonstrated ability to perform without cues during study) and other safe swallow precautions. ST will continue to provide skilled interventions to ensure diet safety and efficiency, as well as increase his independence with use of compensatory swallow strategies while inpatient. SLP Visit Diagnosis Dysphagia, oropharyngeal phase (R13.12);Aphasia (R47.01) Attention and concentration deficit following -- Frontal lobe and executive function deficit following -- Impact on safety and function Mild aspiration risk   CHL IP TREATMENT RECOMMENDATION 05/09/2020 Treatment Recommendations Therapy as outlined in treatment plan below   Prognosis 05/09/2020 Prognosis for Safe Diet Advancement Good Barriers to Reach Goals Severity of deficits Barriers/Prognosis Comment -- CHL IP DIET RECOMMENDATION 06/06/2020 SLP Diet Recommendations Dysphagia 3 (Mech soft) solids;Thin liquid Liquid Administration via Cup;Straw Medication Administration Whole meds with puree Compensations Minimize environmental distractions;Slow rate;Small  sips/bites;Lingual sweep for clearance of pocketing Postural Changes Remain semi-upright after after feeds/meals (Comment);Seated upright at 90 degrees   CHL IP OTHER RECOMMENDATIONS 06/06/2020 Recommended Consults -- Oral Care Recommendations Oral care BID Other Recommendations --  CHL IP FOLLOW UP RECOMMENDATIONS 05/13/2020 Follow up Recommendations Inpatient Rehab   CHL IP FREQUENCY AND DURATION 05/09/2020 Speech Therapy Frequency (ACUTE ONLY) min 2x/week Treatment Duration 2 weeks      CHL IP ORAL PHASE 06/06/2020 Oral Phase Impaired Oral - Pudding Teaspoon -- Oral - Pudding Cup -- Oral - Honey Teaspoon NT Oral - Honey Cup NT Oral - Nectar Teaspoon NT Oral - Nectar Cup NT Oral - Nectar Straw NT Oral - Thin Teaspoon -- Oral - Thin Cup Piecemeal swallowing;Lingual pumping;Lingual/palatal residue;Right anterior bolus loss Oral - Thin Straw Piecemeal swallowing;Lingual/palatal residue Oral - Puree WFL Oral - Mech Soft -- Oral - Regular -- Oral - Multi-Consistency Decreased bolus cohesion Oral - Pill Delayed oral transit;Lingual pumping;Reduced posterior propulsion Oral Phase - Comment --  CHL IP PHARYNGEAL PHASE 06/06/2020 Pharyngeal Phase Impaired Pharyngeal- Pudding Teaspoon -- Pharyngeal -- Pharyngeal- Pudding Cup -- Pharyngeal -- Pharyngeal- Honey Teaspoon NT Pharyngeal -- Pharyngeal- Honey Cup NT Pharyngeal -- Pharyngeal- Nectar Teaspoon -- Pharyngeal -- Pharyngeal- Nectar Cup NT Pharyngeal -- Pharyngeal- Nectar Straw -- Pharyngeal -- Pharyngeal- Thin Teaspoon -- Pharyngeal -- Pharyngeal- Thin Cup Delayed swallow initiation-vallecula;Delayed swallow initiation-pyriform sinuses;Pharyngeal residue - valleculae;Pharyngeal residue - pyriform Pharyngeal -- Pharyngeal- Thin Straw Delayed swallow initiation-vallecula;Delayed swallow initiation-pyriform sinuses;Reduced airway/laryngeal closure;Pharyngeal residue - valleculae;Pharyngeal residue - pyriform;Inter-arytenoid space residue;Penetration/Aspiration during  swallow Pharyngeal Material enters airway, CONTACTS cords and then ejected out Pharyngeal- Puree WFL Pharyngeal -- Pharyngeal- Mechanical Soft -- Pharyngeal -- Pharyngeal- Regular -- Pharyngeal -- Pharyngeal- Multi-consistency WFL Pharyngeal -- Pharyngeal- Pill WFL Pharyngeal -- Pharyngeal Comment --  CHL IP CERVICAL ESOPHAGEAL PHASE 06/06/2020 Cervical Esophageal Phase WFL Pudding Teaspoon -- Pudding Cup -- Honey Teaspoon -- Honey Cup -- Nectar Teaspoon -- Nectar Cup -- Nectar Straw -- Thin Teaspoon -- Thin Cup -- Thin Straw -- Puree -- Mechanical Soft -- Regular -- Multi-consistency -- Pill -- Cervical Esophageal Comment -- [name] 06/06/2020, 10:16 AM              No results for input(s): WBC, HGB, HCT, PLT in the last 72 hours. No results for input(s): NA, K, CL, CO2, GLUCOSE, BUN, CREATININE, CALCIUM in the last 72 hours.  Intake/Output Summary (Last 24 hours) at 06/08/2020 0805 Last data filed at 06/08/2020 0747 Gross per 24 hour  Intake 678 ml  Output --  Net 678 ml   Physical Exam: Vital Signs Blood pressure 122/86, pulse 80, temperature 98.6 F (37 C), temperature source Oral, resp. rate 17, height 5\' 6"  (1.676 m), weight 79 kg, SpO2 100 %.  General: No acute distress Mood and affect are appropriate Heart: Regular rate and rhythm no rubs murmurs or extra sounds Lungs: Clear to auscultation, breathing unlabored, no rales or wheezes Abdomen: Positive bowel sounds, soft nontender to palpation, nondistended Extremities: No clubbing, cyanosis, or edema Skin: No evidence of breakdown, no evidence of rash   Neurologic: Cranial nerves II through XII intact, motor strength is 5/5 in left and 0/5 right  deltoid, bicep, tricep, grip,3- R hip flexor, knee extensors, 0/5 ankle dorsiflexor and plantar flexor unchanged Sensory exam cannot assess due to aphasia  Cerebellar exam cannot perform on Right due to weakness Tone without spasticity akness Musculoskeletal: Full range of motion in  all 4 extremities. No joint swelling   Assessment/Plan: 1. Functional deficits which require 3+ hours per day of interdisciplinary therapy in a comprehensive inpatient rehab setting.  Physiatrist is providing close team supervision and 24 hour management of active medical problems listed below.  Physiatrist and rehab team continue to assess barriers to discharge/monitor patient progress toward functional and medical goals  Care Tool:  Bathing    Body parts bathed by patient: Right arm,Left arm,Chest,Abdomen,Face   Body parts bathed by helper: Left arm,Buttocks,Right lower leg,Left lower leg Body parts n/a: Left arm,Right lower leg,Buttocks   Bathing assist Assist Level: Supervision/Verbal cueing     Upper Body Dressing/Undressing Upper body dressing   What is the patient wearing?: Pull over shirt    Upper body assist Assist Level: Supervision/Verbal cueing    Lower Body Dressing/Undressing Lower body dressing      What is the patient wearing?: Pants     Lower body assist Assist for lower body dressing: Minimal Assistance - Patient > 75%     Toileting Toileting    Toileting assist Assist for toileting: Dependent - Patient 0%     Transfers Chair/bed transfer  Transfers assist     Chair/bed transfer assist level: Minimal Assistance - Patient > 75%     Locomotion Ambulation   Ambulation assist      Assist level: Moderate Assistance - Patient 50 - 74% Assistive device: Walker-rolling Max distance: 20'   Walk 10 feet activity   Assist     Assist level: Moderate Assistance - Patient - 50 - 74% Assistive device: Walker-rolling,Orthosis   Walk 50 feet activity   Assist Walk 50 feet with 2 turns activity did not occur: Safety/medical concerns  Assist level: Moderate Assistance - Patient - 50 - 74% Assistive device: Walker-rolling,Orthosis    Walk 150 feet activity   Assist Walk 150 feet activity did not occur: Safety/medical concerns          Walk 10 feet on uneven surface  activity   Assist Walk 10 feet on uneven surfaces activity did not occur: Safety/medical concerns         Wheelchair     Assist Will patient use wheelchair at discharge?: Yes Type of Wheelchair: Manual    Wheelchair assist level: Maximal Assistance - Patient 25 - 49% Max wheelchair distance: 335ft    Wheelchair 50 feet with 2 turns activity    Assist        Assist Level: Total Assistance - Patient < 25%   Wheelchair 150 feet activity     Assist      Assist Level: Total Assistance - Patient < 25%   Blood pressure 122/86, pulse 80, temperature 98.6 F (37 C), temperature source Oral, resp. rate 17, height 5\' 6"  (1.676 m), weight 79 kg, SpO2 100 %.    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 -patient may  shower -ELOS/Goals: sup/minA,12/31   PT, OT, SLP CIR level   2. Antithrombotics: -DVT/anticoagulation:Lovenox -antiplatelet therapy: Aspirin 325 mg daily and Plavix 75 mg daily x3 months then aspirin alone 3. Pain Management:Continue Tylenol prn 4. Mood:Provide emotional support -antipsychotic agents: N/A 5. Neuropsych: This patientis notcapable of making decisions on hisown behalf. 6. Skin/Wound Care:Routine skin checks 7. Fluids/Electrolytes/Nutrition:Routine in and outs with follow-up chemistries  12/19- will change IVFs to nocturnal 8. Hypertension. Lisinopril 20 mg daily. Monitor with increased mobility Vitals:   06/07/20 1943 06/08/20 0450  BP: 115/77 122/86  Pulse: 75 80  Resp:    Temp: 98 F (36.7 C) 98.6 F (37 C)  SpO2: 97% 100%  controlled 12/30.  9. Dysphagia. Dysphagia #2 Honey thick liquid. Monitor hydration. Speech  therapy follow-up 10. New findings diabetes mellitus. Hemoglobin A1c 8.7. Lantus  insulin 10 units nightly. Diabetic teaching CBG (last 3)  Recent Labs    06/07/20 1841 06/07/20 2116 06/08/20 0546  GLUCAP 94 120* 98  12/29: excellent control  11. Hyperlipidemia. Lipitor 12. Ascending aortic aneurysm with dilation 4.1 cm. Recommendations annual imaging followed by CTA or MRA. 13.  Pre renal azotemia, resolved. Cr 0.96 on 12/27- monitor weekly.  15.  Urinary retention appreciate uro assist-  on flomax,f/u with Urology - keep foley for d/c HHRN to follw , will have voiding trial at Uro f/u appt  16. Bradycardic to 60s-on average asymptomatic no neg inotropic meds   LOS: 24 days A FACE TO FACE EVALUATION WAS PERFORMED  Erick Colace 06/08/2020, 8:05 AM

## 2020-06-08 NOTE — Discharge Instructions (Signed)
Inpatient Rehab Discharge Instructions  Aaron Weiss Discharge date and time: No discharge date for patient encounter.   Activities/Precautions/ Functional Status: Activity: activity as tolerated Diet: Mechanical soft thin liquids with diabetic restrictions Wound Care: Routine skin checks Functional status:  ___ No restrictions     ___ Walk up steps independently ___ 24/7 supervision/assistance   ___ Walk up steps with assistance ___ Intermittent supervision/assistance  ___ Bathe/dress independently ___ Walk with walker     _x__ Bathe/dress with assistance ___ Walk Independently    ___ Shower independently ___ Walk with assistance    ___ Shower with assistance ___ No alcohol     ___ Return to work/school ________  COMMUNITY REFERRALS UPON DISCHARGE:    Home Health:   PT     OT     ST                    Agency: [facility]  Phone: [phone]    Medical Equipment/Items Ordered: Hemi Height Wheelchair, Agricultural consultant                                                 Agency/Supplier: [employer]   Special Instructions: Please allow 24-72 for scheduling. Family to set PCP follow up appointment with Dr. Mayra Neer. [name] at [facility] in [place], [place]. [phone]  No driving smoking or alcohol  Continue aspirin 325 mg daily and Plavix 75 mg daily x3 months total and then aspirin alone   Referral made to urology services for voiding trial [phone]   STROKE/TIA DISCHARGE INSTRUCTIONS SMOKING Cigarette smoking nearly doubles your risk of having a stroke & is the single most alterable risk factor  If you smoke or have smoked in the last 12 months, you are advised to quit smoking for your health.  Most of the excess cardiovascular risk related to smoking disappears within a year of stopping.  Ask you doctor about anti-smoking medications  [place] Quit Line: 1-800-QUIT NOW  Free Smoking Cessation Classes [phone]  CHOLESTEROL Know your levels;  limit fat & cholesterol in your diet  Lipid Panel     Component Value Date/Time   CHOL 183 05/07/2020 0347   TRIG 111 05/07/2020 0347   HDL 39 (L) 05/07/2020 0347   CHOLHDL 4.7 05/07/2020 0347   VLDL 22 05/07/2020 0347   LDLCALC 122 (H) 05/07/2020 0347      Many patients benefit from treatment even if their cholesterol is at goal.  Goal: Total Cholesterol (CHOL) less than 160  Goal:  Triglycerides (TRIG) less than 150  Goal:  HDL greater than 40  Goal:  LDL (LDLCALC) less than 100   BLOOD PRESSURE American Stroke Association blood pressure target is less that 120/80 mm/Hg  Your discharge blood pressure is:  BP: 137/84  Monitor your blood pressure  Limit your salt and alcohol intake  Many individuals will require more than one medication for high blood pressure  DIABETES (A1c is a blood sugar average for last 3 months) Goal HGBA1c is under 7% (HBGA1c is blood sugar average for last 3 months)  Diabetes: No known diagnosis of diabetes    Lab Results  Component Value Date   HGBA1C 8.7 (H) 05/07/2020     Your HGBA1c can be lowered with medications, healthy diet, and exercise.  Check your blood  sugar as directed by your physician  Call your physician if you experience unexplained or low blood sugars.  PHYSICAL ACTIVITY/REHABILITATION Goal is 30 minutes at least 4 days per week  Activity: Increase activity slowly, Therapies: Physical Therapy: Home Health Return to work:   Activity decreases your risk of heart attack and stroke and makes your heart stronger.  It helps control your weight and blood pressure; helps you relax and can improve your mood.  Participate in a regular exercise program.  Talk with your doctor about the best form of exercise for you (dancing, walking, swimming, cycling).  DIET/WEIGHT Goal is to maintain a healthy weight  Your discharge diet is:  Diet Order            DIET - DYS 1 Room service appropriate? Yes; Fluid consistency: Pudding Thick   Diet effective now                 liquids Your height is:  Height: 5\' 6"  (167.6 cm) Your current weight is: Weight: 81.9 kg Your Body Mass Index (BMI) is:  BMI (Calculated): 29.16  Following the type of diet specifically designed for you will help prevent another stroke.  Your goal weight range is:    Your goal Body Mass Index (BMI) is 19-24.  Healthy food habits can help reduce 3 risk factors for stroke:  High cholesterol, hypertension, and excess weight.  RESOURCES Stroke/Support Group:  Call [phone]   STROKE EDUCATION PROVIDED/REVIEWED AND GIVEN TO PATIENT Stroke warning signs and symptoms How to activate emergency medical system (call 911). Medications prescribed at discharge. Need for follow-up after discharge. Personal risk factors for stroke. Pneumonia vaccine given:  Flu vaccine given:  My questions have been answered, the writing is legible, and I understand these instructions.  I will adhere to these goals & educational materials that have been provided to me after my discharge from the hospital.      My questions have been answered and I understand these instructions. I will adhere to these goals and the provided educational materials after my discharge from the hospital.  Patient/Caregiver Signature _______________________________ Date __________  Clinician Signature _______________________________________ Date __________  Please bring this form and your medication list with you to all your follow-up doctor's appointments.

## 2020-06-08 NOTE — Progress Notes (Signed)
Physical Therapy Session Note  Patient Details  Name: Hubert Raatz MRN: [id number] Date of Birth: 1966/02/24  Today's Date: 06/08/2020 PT Individual Time: 0910-1005 PT Individual Time Calculation (min): 55 min   Short Term Goals: Week 1:  PT Short Term Goal 1 (Week 1): Pt will perform wc to/from bed w/mod assist to R, max assist of 1 to L PT Short Term Goal 1 - Progress (Week 1): Met PT Short Term Goal 2 (Week 1): sit to stand w/LRAD and mod assist PT Short Term Goal 2 - Progress (Week 1): Met PT Short Term Goal 3 (Week 1): gait x 68f w/LRAD and mod assist of 2, appropriate bracing/wrapping of R ankle PT Short Term Goal 3 - Progress (Week 1): Met PT Short Term Goal 4 (Week 1): pt will maintain sitting balance w/cross body reaching tasks w/min assist PT Short Term Goal 4 - Progress (Week 1): Met Week 2:  PT Short Term Goal 1 (Week 2): pt will ambulate 271fwith min assist and LRAD PT Short Term Goal 1 - Progress (Week 2): Progressing toward goal PT Short Term Goal 2 (Week 2): Pt will perform stand pivot transfers with mod assist PT Short Term Goal 2 - Progress (Week 2): Met PT Short Term Goal 3 (Week 2): Pt will perform swing pivot transfers with min assist or better in both directions PT Short Term Goal 3 - Progress (Week 2): Met PT Short Term Goal 4 (Week 2): Pt will demonstrate increased activation of his R glutes/quads and demonstrate carryover into gait training PT Short Term Goal 4 - Progress (Week 2): Met Week 3:  PT Short Term Goal 1 (Week 3): STG=LTG due to ELOS  Skilled Therapeutic Interventions/Progress Updates:   No family present to final therapy session to perform any hands on education and training for pt min assist level overall.   Pt received supine in bed and agreeable to PT. Supine>sit transfer with supervision assist and cues attention to the RUE for safety. PT assisted pt to don bil shoes and AFO while pt sat EOB x 5 minutes without assist from PT to maintain  balance. Squat [facility] from PT for safety.   Pt transported to day room. Gait training with RW x 6060fith min assist fotr safety and min cues for step length and R knee control intermittently.   WC mobility x 150f36fth supervsion assist and min cues for awareness of RLE position to improve safety as well as improved turning technique to the L.   Stair management training with 1 rail R and min assist from PT x 4 to up/down 1 step with posterior descent. Moderate cues for step to gait pattern and improved step width with descent. No knee buckling noted.   Car transfer training with min assist for squat pivot and stand pivot with RW. Moderate cues for transfer with RW due to poor AD management and mild posterior LOB.   PT instructed pt in Grad day assessment to measure progress toward goals. See d/c note for details.  Pt returned to room and performed squat pivot transfer to bed with CGA after 2 tries with cues for proper UE placement. Sit>supine completed with supervision assist, and left supine in bed with call bell in reach and all needs met.        Therapy Documentation Precautions:  Precautions Precautions: Fall Precaution Comments: aphasia Restrictions Weight Bearing Restrictions: No    Vital Signs: Therapy Vitals Temp: 99 F (  37.2 C) Temp Source: Oral Pulse Rate: 77 Resp: 18 BP: 111/69 Patient Position (if appropriate): Lying Oxygen Therapy SpO2: 96 % O2 Device: Room Air Pain: Pain Assessment Pain Scale: Faces Pain Score: 0-No pain Mobility: Bed Mobility Bed Mobility: Supine to Sit;Sit to Supine Rolling Right: Supervision/verbal cueing Rolling Left: Supervision/Verbal cueing Right Sidelying to Sit: Supervision/Verbal cueing Supine to Sit: Minimal Assistance - Patient > 75% Sit to Supine: Supervision/Verbal cueing Sit to Sidelying Right: Supervision/Verbal cueing Transfers Transfers: Stand to Sit;Sit to Stand;Stand Pivot Transfers Sit to  Stand: Minimal Assistance - Patient > 75% Stand to Sit: Minimal Assistance - Patient > 75% Stand Pivot Transfers: Minimal Assistance - Patient > 75% Squat Pivot Transfers: Contact Guard/Touching assist Transfer (Assistive device): Rolling walker Locomotion : Gait Ambulation: Yes Gait Assistance: Minimal Assistance - Patient > 75% Assistive device: Rolling walker;Other (Comment) (AFO, R hand splint) Gait Assistance Details: Verbal cues for safe use of DME/AE;Manual facilitation for weight shifting;Verbal cues for sequencing;Verbal cues for technique;Verbal cues for precautions/safety;Verbal cues for gait pattern Gait Gait: Yes Gait Pattern: Impaired Gait Pattern: Lateral hip instability;Narrow base of support;Right genu recurvatum;Poor foot clearance - right Stairs / Additional Locomotion Stairs: Yes Stairs Assistance: Minimal Assistance - Patient > 75% Stair Management Technique: One rail Right Number of Stairs: 4 Height of Stairs: 6 Wheelchair Mobility Wheelchair Mobility: Yes Wheelchair Assistance: Supervision/Verbal cueing Wheelchair Propulsion: Left lower extremity;Left upper extremity Wheelchair Parts Management: Needs assistance Distance: 150  Trunk/Postural Assessment : Cervical Assessment Cervical Assessment: Exceptions to [facility] (slight forward head) Thoracic Assessment Thoracic Assessment: Exceptions to [facility] (thoracic flexion) Lumbar Assessment Lumbar Assessment: Exceptions to [facility] (posterior pelvic tilt) Postural Control Postural Control: Deficits on evaluation Trunk Control: sits w/r shoulder retracted, able patrtially  to correct w/cues Righting Reactions: delayed R  Balance: Balance Balance Assessed: Yes Static Sitting Balance Static Sitting - Balance Support: Feet supported Static Sitting - Level of Assistance: 7: Independent Dynamic Sitting Balance Dynamic Sitting - Balance Support: Left upper extremity supported;Feet supported Dynamic Sitting - Level of  Assistance: 6: Modified independent (Device/Increase time) Static Standing Balance Static Standing - Balance Support: During functional activity;Left upper extremity supported Static Standing - Level of Assistance: 4: Min assist Dynamic Standing Balance Dynamic Standing - Balance Support: During functional activity;No upper extremity supported Dynamic Standing - Level of Assistance: 4: Min assist    Therapy/Group: Individual Therapy  Lorie Phenix 06/08/2020, 1:36 PM

## 2020-06-08 NOTE — Progress Notes (Signed)
Physical Therapy Discharge Summary  Patient Details  Name: Aaron Weiss MRN: [id number] Date of Birth: 06/05/1966  Today's Date: 06/08/2020      Patient has met 11 of 11 long term goals due to improved activity tolerance, improved balance, improved postural control, increased strength, increased range of motion, ability to compensate for deficits, functional use of  right upper extremity and right lower extremity, improved attention, improved awareness and improved coordination.  Patient to discharge at a wheelchair level Supervision.   Patient's care partner is independent to provide the necessary physical assistance at discharge.  Reasons goals not met: All PT goals met.   Recommendation:  Patient will benefit from ongoing skilled PT services in home health setting to continue to advance safe functional mobility, address ongoing impairments in balance, safety, tranfers, strength, coordinaiton, and minimize fall risk.  Equipment: RW and WC  Reasons for discharge: treatment goals met and discharge from hospital  Patient/family agrees with progress made and goals achieved: Yes  PT Discharge Precautions/Restrictions Restrictions Weight Bearing Restrictions: No Vital Signs   Pain Pain Assessment Pain Scale: 0-10 Pain Score: 0-No pain Vision/Perception  Vision - Assessment Eye Alignment: Within Functional Limits Ocular Range of Motion: Within Functional Limits Tracking/Visual Pursuits: Decreased smoothness of eye movement to LEFT superior field;Decreased smoothness of eye movement to LEFT inferior field Saccades: Additional eye shifts occurred during testing Perception Perception: Impaired Inattention/Neglect: Does not attend to right visual field (greatly improve from eval) Praxis Praxis: Impaired Praxis Impairment Details: Motor planning Praxis-Other Comments: mild motor planning deficits with novel tasks  Cognition Orientation Level: Oriented to  person Sensation Sensation Light Touch: Impaired Detail Central sensation comments: able to detect deep pressure in RUE, intermittent deep pressure in the RLE Light Touch Impaired Details: Absent RLE;Absent RUE Proprioception: Impaired by gross assessment (aphasia may be limiting assessment) Coordination Gross Motor Movements are Fluid and Coordinated: No Fine Motor Movements are Fluid and Coordinated: No Coordination and Movement Description: RLE dymetria due to sensory deficits Heel Shin Test: dysmetria RLE due to sensory deficits , strength deficits, and receptive aphasia Motor  Motor Motor: Hemiplegia;Abnormal tone;Abnormal postural alignment and control Motor - Discharge Observations: R hemiplegia, UE>LE. mild tone in the RUE with high velocity movements .  Mobility Bed Mobility Bed Mobility: Rolling Right;Rolling Left;Right Sidelying to Sit;Sit to Sidelying Right Rolling Right: Supervision/verbal cueing Rolling Left: Supervision/Verbal cueing Right Sidelying to Sit: Supervision/Verbal cueing Sit to Sidelying Right: Supervision/Verbal cueing Transfers Transfers: Stand to Sit;Sit to Stand;Squat Pivot Transfers;Stand Pivot Transfers Sit to Stand: Contact Guard/Touching assist Stand to Sit: Contact Guard/Touching assist Stand Pivot Transfers: Minimal Assistance - Patient > 75% Squat Pivot Transfers: Contact Guard/Touching assist Transfer (Assistive device): Rolling walker Locomotion  Gait Ambulation: Yes Gait Assistance: Minimal Assistance - Patient > 75% Assistive device: Rolling walker;Other (Comment) (AFO, R hand splint) Gait Assistance Details: Verbal cues for safe use of DME/AE;Manual facilitation for weight shifting;Verbal cues for sequencing;Verbal cues for technique;Verbal cues for precautions/safety;Verbal cues for gait pattern Gait Gait: Yes Gait Pattern: Impaired Gait Pattern: Lateral hip instability;Narrow base of support;Right genu recurvatum;Poor foot clearance  - right Stairs / Additional Locomotion Stairs: Yes Stairs Assistance: Minimal Assistance - Patient > 75% Stair Management Technique: One rail Right Number of Stairs: 4 Height of Stairs: 6 Wheelchair Mobility Wheelchair Mobility: Yes Wheelchair Assistance: Supervision/Verbal cueing Wheelchair Propulsion: Left lower extremity;Left upper extremity Wheelchair Parts Management: Needs assistance Distance: 150  Trunk/Postural Assessment  Cervical Assessment Cervical Assessment: Within Functional Limits Thoracic Assessment Thoracic Assessment: Exceptions to  WFL Lumbar Assessment Lumbar Assessment: Within Functional Limits Postural Control Postural Control: Deficits on evaluation Trunk Control: sits w/r shoulder retracted, able patrtially  to correct w/cues Righting Reactions: delayed R  Balance Balance Balance Assessed: Yes Static Sitting Balance Static Sitting - Balance Support: Feet supported Static Sitting - Level of Assistance: 6: Modified independent (Device/Increase time) Dynamic Sitting Balance Dynamic Sitting - Balance Support: Left upper extremity supported;Feet supported Dynamic Sitting - Level of Assistance: 5: Stand by assistance Static Standing Balance Static Standing - Balance Support: Right upper extremity supported Static Standing - Level of Assistance: 4: Min assist Dynamic Standing Balance Dynamic Standing - Balance Support: Bilateral upper extremity supported;During functional activity Dynamic Standing - Level of Assistance: 4: Min assist Extremity Assessment      RLE Assessment RLE Assessment: Exceptions to [facility] Passive Range of Motion (PROM) Comments: grossly WFL Active Range of Motion (AROM) Comments: mild decrease in all movements due to strengths deficits General Strength Comments: grossly 3+/5 proximal to distal except knee extension 4-/5. LLE Assessment LLE Assessment: Within Functional Limits General Strength Comments: 5/5 w/resistive  testing,    Lorie Phenix 06/08/2020, 10:07 AM

## 2020-06-09 LAB — GLUCOSE, CAPILLARY: Glucose-Capillary: 105 mg/dL — ABNORMAL HIGH (ref 70–99)

## 2020-06-09 NOTE — Progress Notes (Signed)
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

## 2020-06-09 NOTE — Progress Notes (Signed)
Pt belongings gathered. PA Dan in to discuss discharge instructions. Family in agreement with no further concerns. Pt left per wheelchair to private vehicle. No complications noted.  Mylo Red, LPN

## 2020-06-09 NOTE — Progress Notes (Signed)
Suppository given at this time. Last reported BM 12/26.  Mylo Red, LPN

## 2020-06-12 ENCOUNTER — Encounter: Payer: Self-pay | Admitting: *Deleted

## 2020-06-12 NOTE — Progress Notes (Signed)
Patient ID: Aaron Weiss, male   DOB: [birth year], 55 y.o.   MRN: [id number] Patient enrolled for [employer] to ship a 30 day cardiac event monitor to his home.  Letter with instructions mailed to patient.

## 2020-06-22 ENCOUNTER — Encounter: Payer: Self-pay | Attending: Physical Medicine & Rehabilitation | Admitting: Physical Medicine & Rehabilitation

## 2022-11-22 IMAGING — CT CT HEAD CODE STROKE
3 series · 14 of 47 positions shown, 16 images · non-contrast
Comparison: None.

CLINICAL DATA: Code stroke. Acute stroke suspected. Neuro deficit.

EXAM:
CT HEAD WITHOUT CONTRAST
TECHNIQUE: Contiguous axial images were obtained from the base of the skull
through the vertex without intravenous contrast.

[Series 3: head 5.0 st · axial · 0.45mm/px · z∈[-145,-15]mm · 8 of 32 slices shown, 10 images]
[im 3/32  brain]
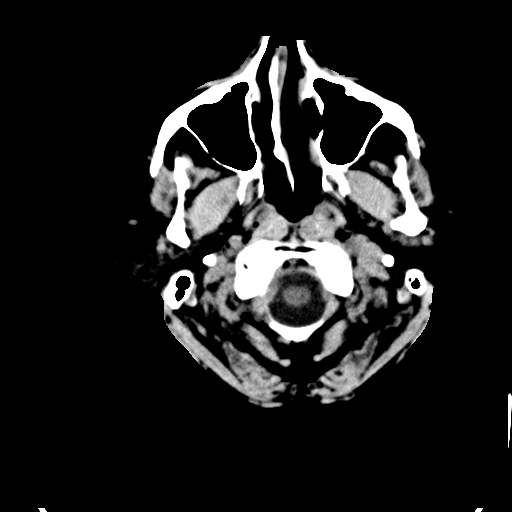
[im 3/32  bone]
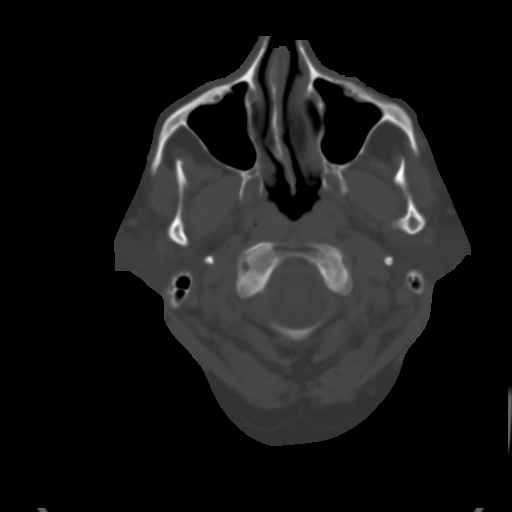
[im 7/32  brain]
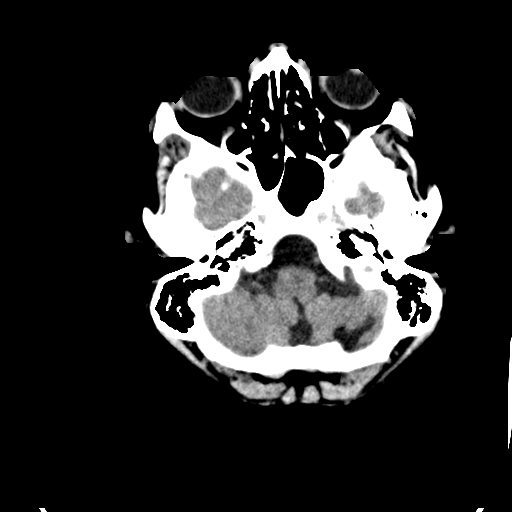
[im 10/32  brain]
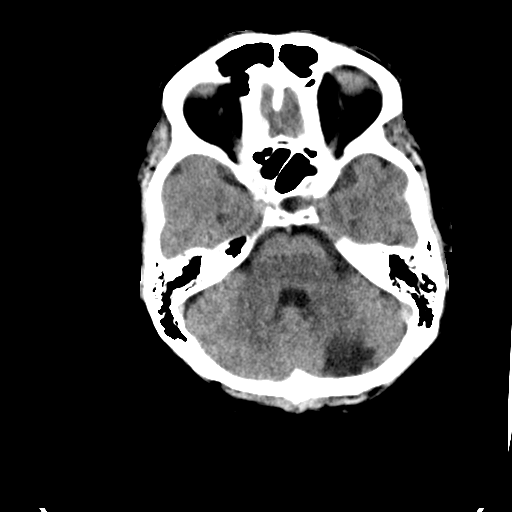
[im 14/32  brain]
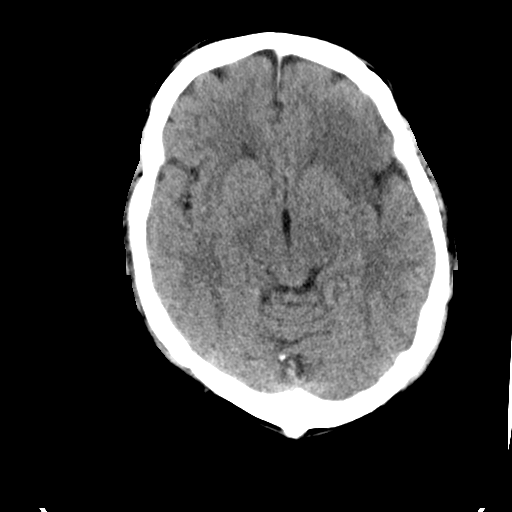
[im 18/32  brain]
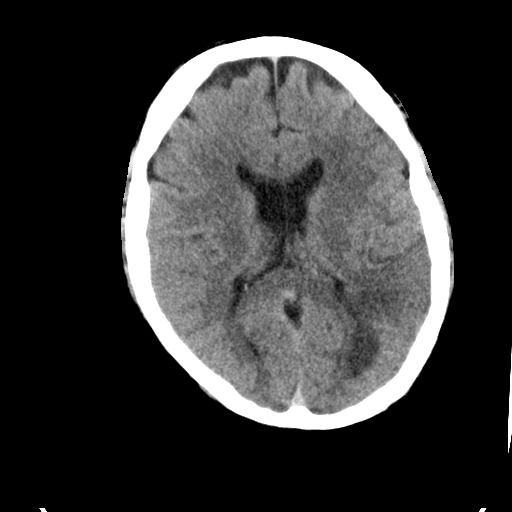
[im 18/32  bone]
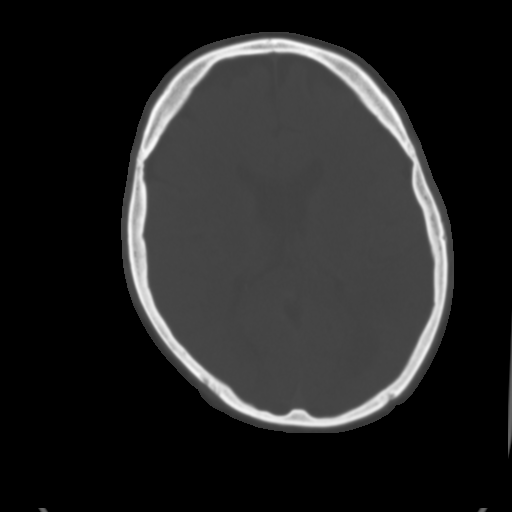
[im 22/32  brain]
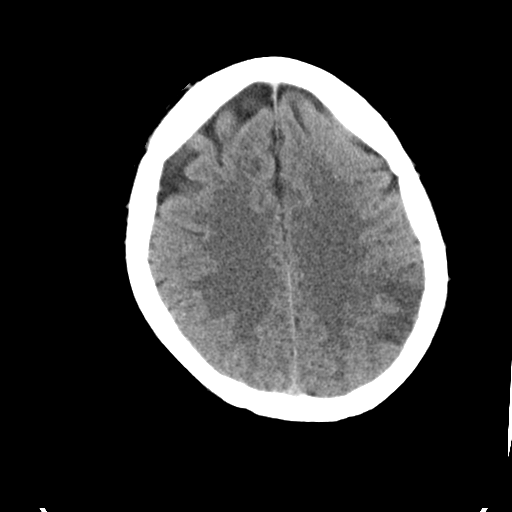
[im 25/32  brain]
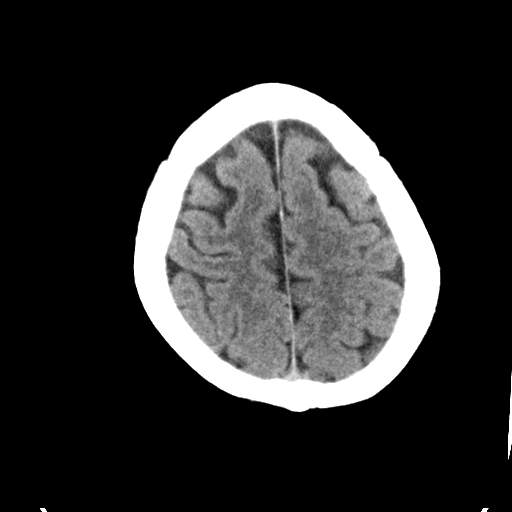
[im 29/32  brain]
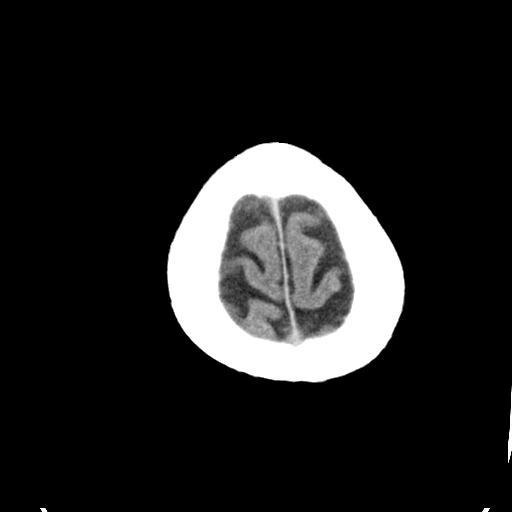

[Series 5: head 3.0 cor st · coronal · 0.31mm/px · 3 of 67 slices shown]
[im 23/67  brain]
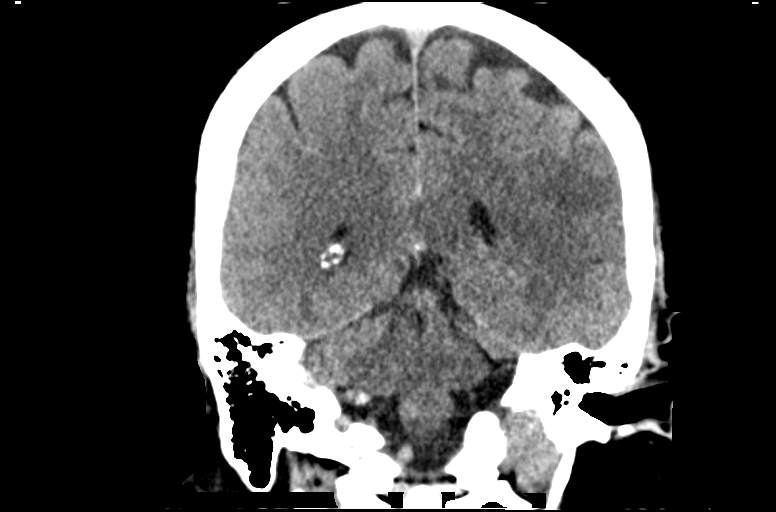
[im 30/67  brain]
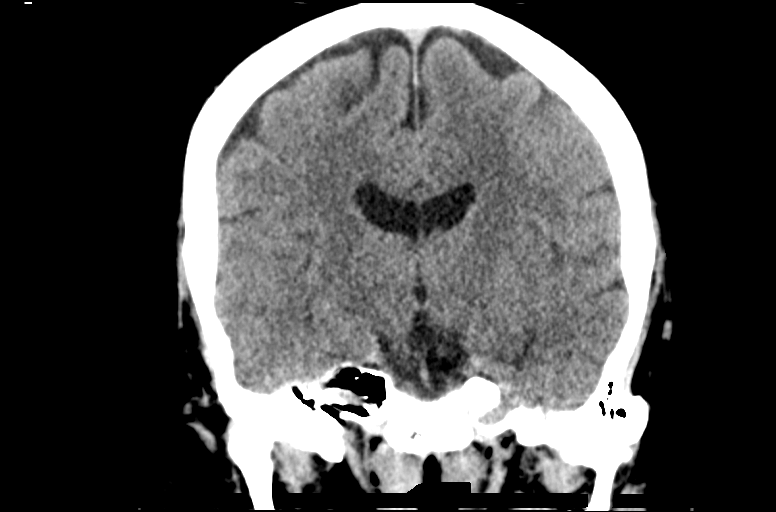
[im 37/67  brain]
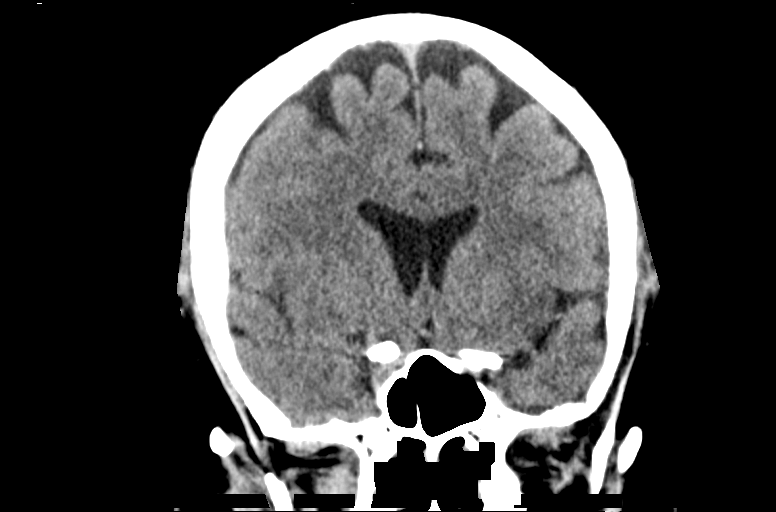

[Series 6: head 3.0 sag st · sagittal · 0.31mm/px · 3 of 67 slices shown]
[im 23/67  brain]
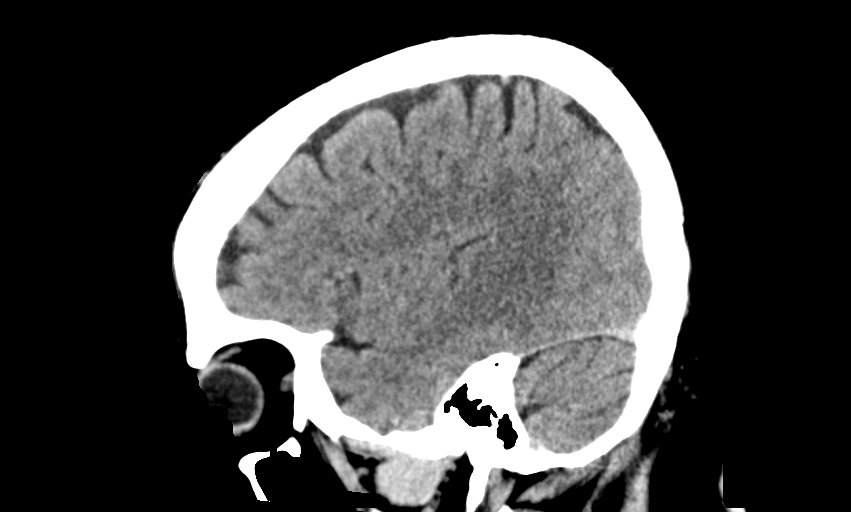
[im 34/67  brain]
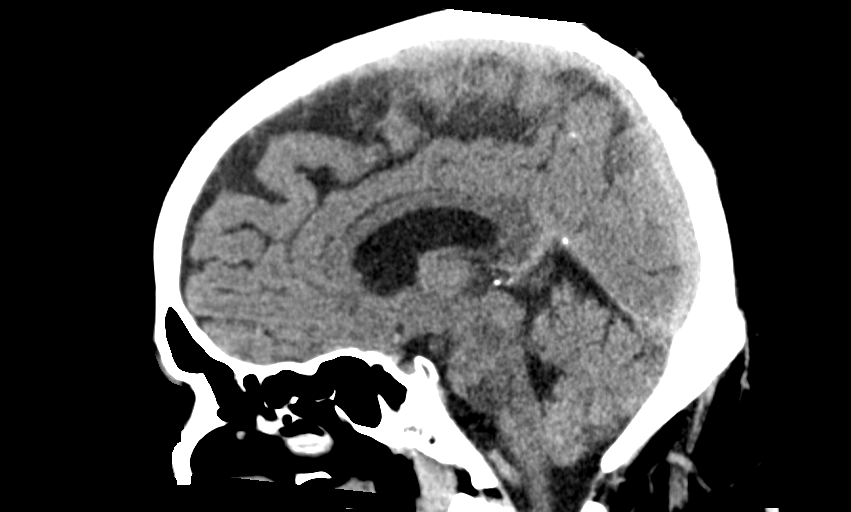
[im 45/67  brain]
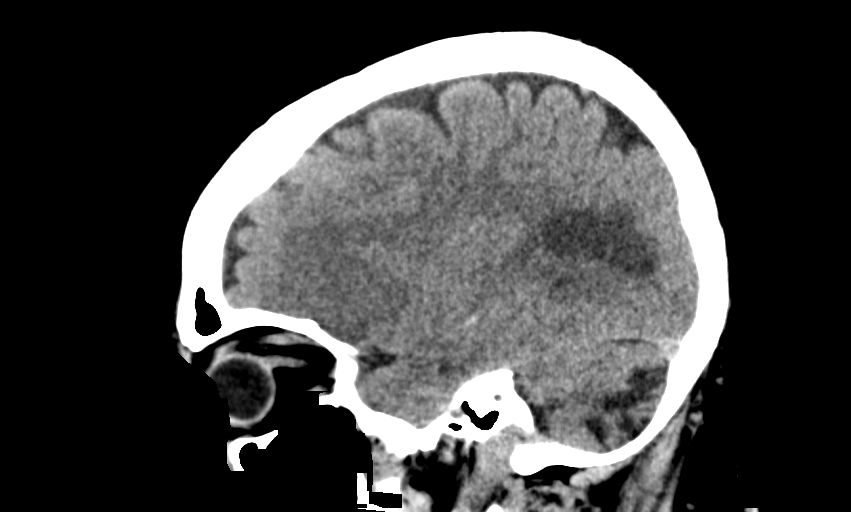

[14 of 47 positions shown; findings below may reference images not displayed]

FINDINGS: Brain: Abnormal hypodensity and loss of gray-white differentiation
involving the left MCA territory, including the insula and
frontoparietal cortex. The more frontoparietal area of hypodensity
is more hypodense and may be more subacute in chronicity. Mild
associated sulcal effacement without midline shift. No evidence of
acute hemorrhage. Remote left cerebellar infarct.

Vascular: No definite hyperdense vessel.

Skull: No acute fracture.

Sinuses/Orbits: No acute findings.

Other: No mastoid effusions.

ASPECTS (Alberta Stroke Program Early CT Score)

- Ganglionic level infarction (caudate, lentiform nuclei, internal
capsule, insula, M1-M3 cortex): 4

- Supraganglionic infarction (M4-M6 cortex): 1

Total score (0-10 with 10 being normal): 5
IMPRESSION: 1. Findings concerning for acute left MCA territory infarct, as
detailed above.ASPECTS is 5.
2. This includes an area of hypodensity in the posterior/distal left
MCA territory that is more hypodense and may be more subacute in
chronicity.
3. No acute hemorrhage.
4. Remote left cerebellar infarct.

Code stroke imaging results were communicated on 05/06/2020 at [DATE] to provider Dr. Wessels Via telephone.

## 2022-11-25 IMAGING — CT CT ANGIO HEAD
1 of 8 series · 14 of 47 positions shown · IV contrast (OMNI)
Comparison: Prior CT and MRI exams from May 06, 2020.

CLINICAL DATA: Stroke/TIA.  Assess intracranial arteries.

EXAM:
CT ANGIOGRAPHY HEAD AND NECK
TECHNIQUE: Multidetector CT imaging of the head and neck was performed using
the standard protocol during bolus administration of intravenous
contrast. Multiplanar CT image reconstructions and MIPs were
obtained to evaluate the vascular anatomy. Carotid stenosis
measurements (when applicable) are obtained utilizing NASCET
criteria, using the distal internal carotid diameter as the
denominator.
CONTRAST:  75mL OMNIPAQUE IOHEXOL 350 MG/ML SOLN

[Series 7: thin · axial · 0.46mm/px · z∈[-246,+51]mm · 14 of 686 slices shown]
[im 46/686  brain]
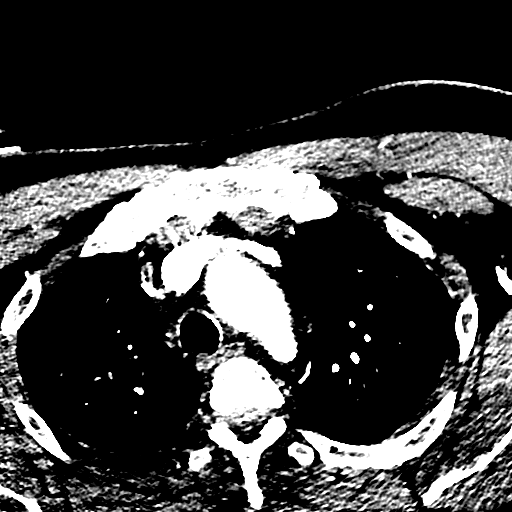
[im 92/686  bone]
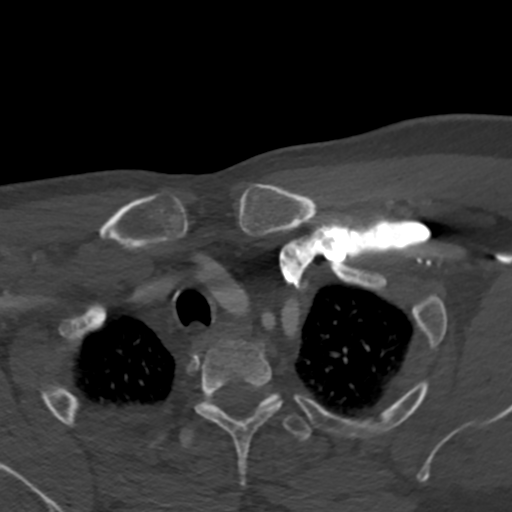
[im 138/686  brain]
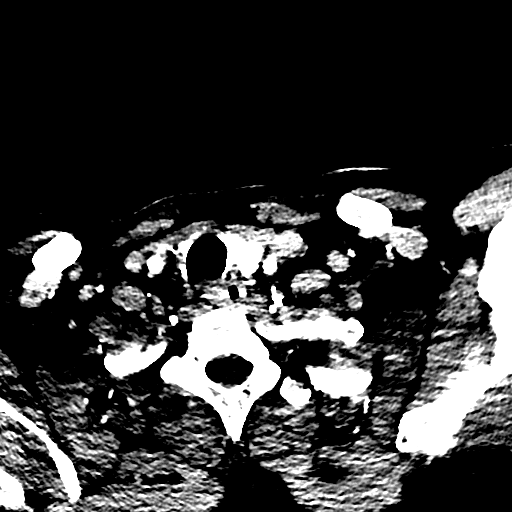
[im 183/686  bone]
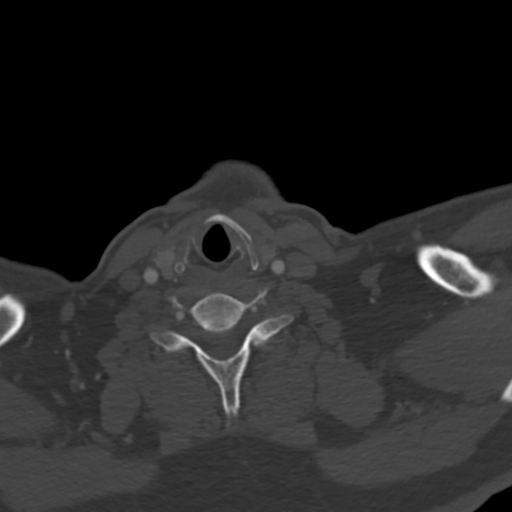
[im 229/686  brain]
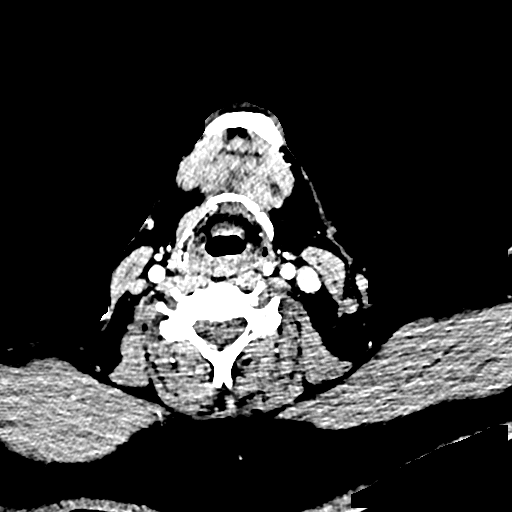
[im 275/686  bone]
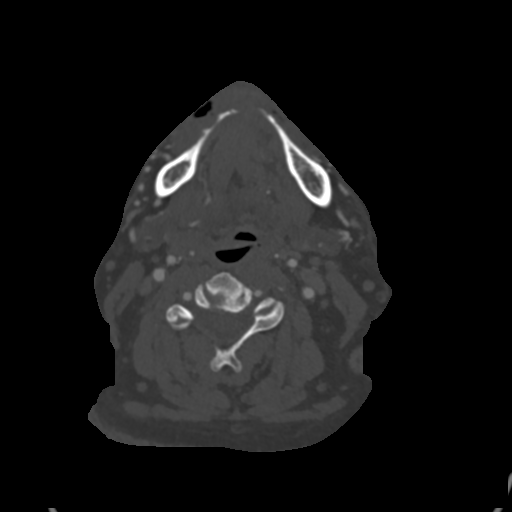
[im 320/686  brain]
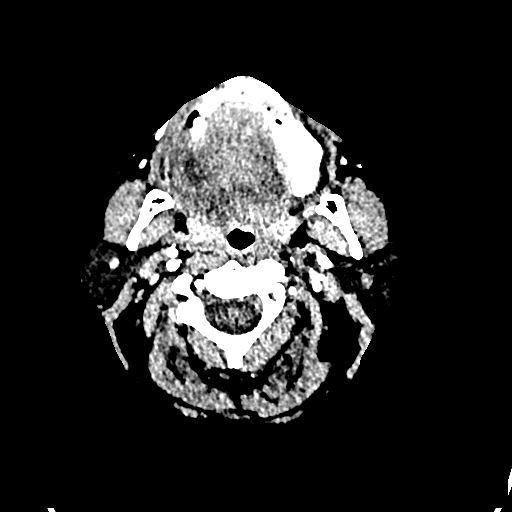
[im 366/686  bone]
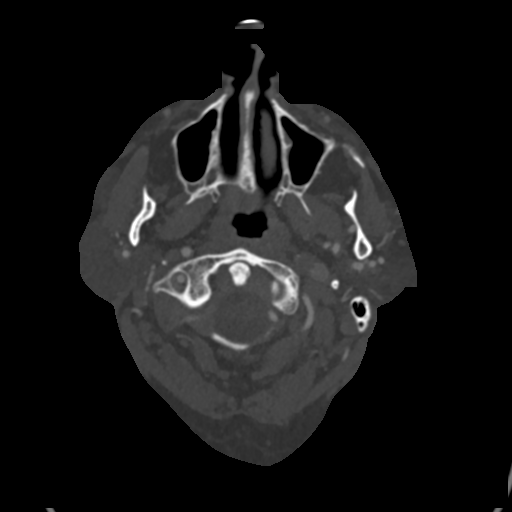
[im 412/686  brain]
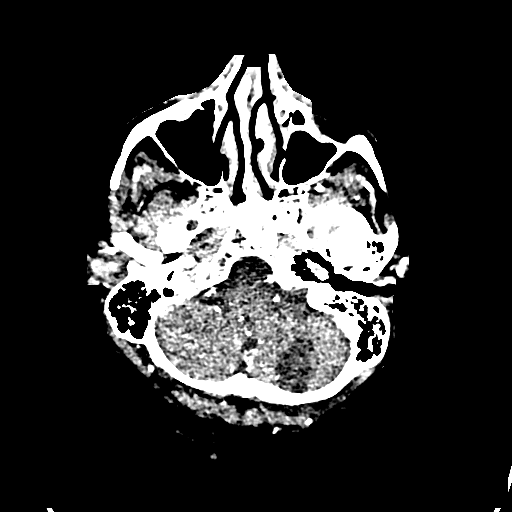
[im 457/686  bone]
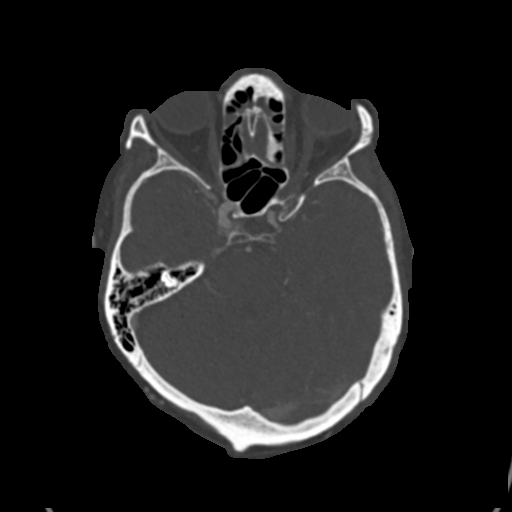
[im 503/686  brain]
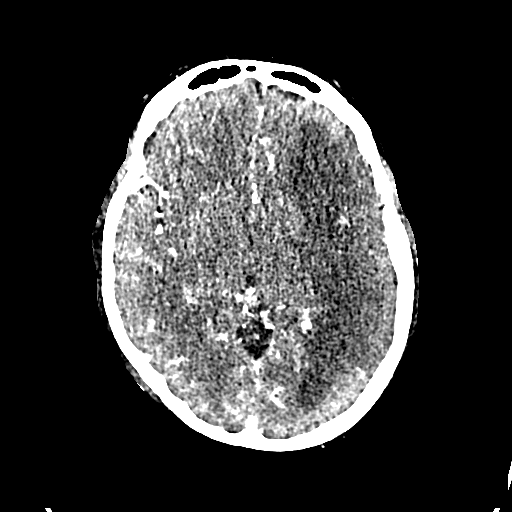
[im 549/686  bone]
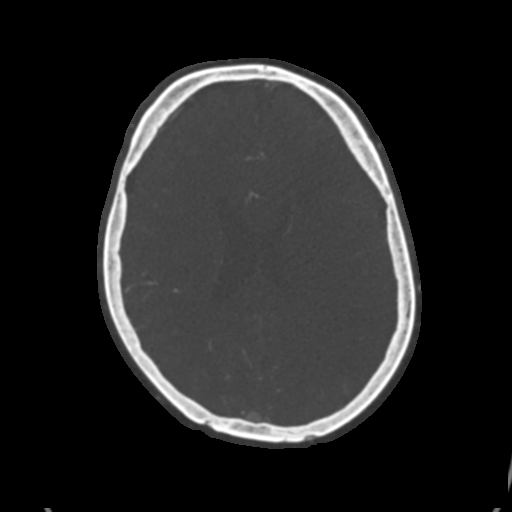
[im 594/686  brain]
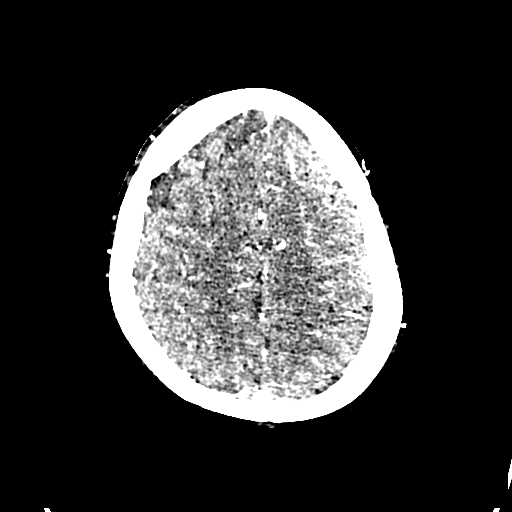
[im 640/686  bone]
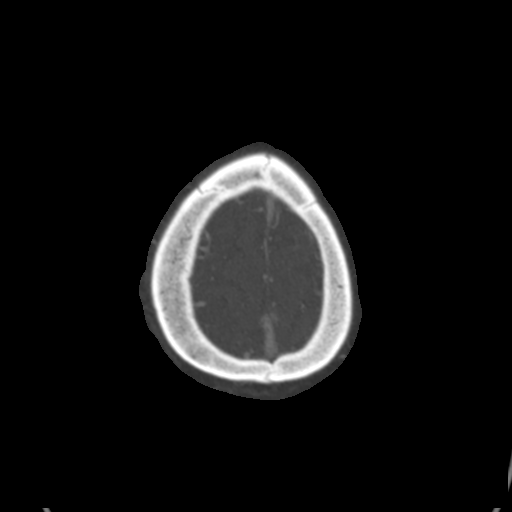

[14 of 47 positions shown; findings below may reference images not displayed]

FINDINGS: CTA NECK FINDINGS

Aortic arch: Great vessel origins are patent. Please see prior CTA
for characterization of ascending aortic aneurysm, not imaged on
this study.

Right carotid system: No evidence of dissection, stenosis (50% or
greater) or occlusion.

Left carotid system: Similar abrupt occlusion of the proximal
cervical internal carotid artery with non opacification of the
internal carotid artery in the remainder of the neck.

Vertebral arteries: Right dominant. No evidence of dissection,
stenosis (50% or greater) or occlusion.

Skeleton: No acute fracture.

Other neck: No mass or suspicious adenopathy.

Upper chest: No acute findings.

Review of the MIP images confirms the above findings

CTA HEAD FINDINGS

Anterior circulation: The left petrous and cavernous ICA is not
opacified, similar to prior. In comparison to prior, there is new
occlusion of the proximal left M1 MCA. Minimal opacification of the
paraclinoid left ICA is likely secondary to retrograde flow from the
left A1 ACA. There is poor opacification of more distal left MCA
branches. The right ICA and right MCA and ACA branches are patent
without evidence of hemodynamically significant stenosis.

Posterior circulation: Right fetal type PCA. No evidence of
hemodynamically significant proximal stenosis or large vessel
occlusion in the posterior circulation.

Venous sinuses: As permitted by contrast timing, patent.
IMPRESSION: Redemonstrated occlusion of the proximal left cervical ICA. New
occlusion of the left M1 MCA proximally with poor opacification of
distal left MCA branches, compatible with propogation of thrombus.

Critical Value/emergent results were called by telephone at the time
of interpretation on 05/09/2020 at [DATE] to provider PAULUS N CEEJAY ,
who verbally acknowledged these results.
